# Patient Record
Sex: Female | Born: 1965 | Race: Black or African American | Hispanic: No | Marital: Married | Smoking: Never smoker
Health system: Southern US, Community
[De-identification: ages and names within clinical notes are randomized; demographics above are authoritative.]

## PROBLEM LIST (undated history)

## (undated) DIAGNOSIS — I10 Essential (primary) hypertension: Secondary | ICD-10-CM

## (undated) DIAGNOSIS — E559 Vitamin D deficiency, unspecified: Secondary | ICD-10-CM

## (undated) DIAGNOSIS — K5900 Constipation, unspecified: Secondary | ICD-10-CM

## (undated) DIAGNOSIS — Z9889 Other specified postprocedural states: Secondary | ICD-10-CM

## (undated) DIAGNOSIS — R112 Nausea with vomiting, unspecified: Secondary | ICD-10-CM

## (undated) DIAGNOSIS — E785 Hyperlipidemia, unspecified: Secondary | ICD-10-CM

## (undated) DIAGNOSIS — R7303 Prediabetes: Secondary | ICD-10-CM

## (undated) DIAGNOSIS — M549 Dorsalgia, unspecified: Secondary | ICD-10-CM

## (undated) DIAGNOSIS — M1712 Unilateral primary osteoarthritis, left knee: Secondary | ICD-10-CM

## (undated) DIAGNOSIS — K219 Gastro-esophageal reflux disease without esophagitis: Secondary | ICD-10-CM

## (undated) DIAGNOSIS — R5382 Chronic fatigue, unspecified: Secondary | ICD-10-CM

## (undated) DIAGNOSIS — M1990 Unspecified osteoarthritis, unspecified site: Secondary | ICD-10-CM

## (undated) DIAGNOSIS — E669 Obesity, unspecified: Secondary | ICD-10-CM

## (undated) DIAGNOSIS — K859 Acute pancreatitis without necrosis or infection, unspecified: Secondary | ICD-10-CM

## (undated) DIAGNOSIS — M2550 Pain in unspecified joint: Secondary | ICD-10-CM

## (undated) DIAGNOSIS — G5600 Carpal tunnel syndrome, unspecified upper limb: Secondary | ICD-10-CM

## (undated) HISTORY — PX: ABDOMINAL HYSTERECTOMY: SHX81

## (undated) HISTORY — PX: OTHER SURGICAL HISTORY: SHX169

## (undated) HISTORY — DX: Obesity, unspecified: E66.9

## (undated) HISTORY — DX: Unspecified osteoarthritis, unspecified site: M19.90

## (undated) HISTORY — PX: DILATION AND CURETTAGE OF UTERUS: SHX78

## (undated) HISTORY — DX: Chronic fatigue, unspecified: R53.82

## (undated) HISTORY — DX: Pain in unspecified joint: M25.50

## (undated) HISTORY — DX: Gastro-esophageal reflux disease without esophagitis: K21.9

## (undated) HISTORY — DX: Unilateral primary osteoarthritis, left knee: M17.12

## (undated) HISTORY — DX: Dorsalgia, unspecified: M54.9

## (undated) HISTORY — DX: Prediabetes: R73.03

## (undated) HISTORY — DX: Constipation, unspecified: K59.00

## (undated) HISTORY — DX: Essential (primary) hypertension: I10

## (undated) HISTORY — DX: Hyperlipidemia, unspecified: E78.5

## (undated) HISTORY — DX: Acute pancreatitis without necrosis or infection, unspecified: K85.90

## (undated) HISTORY — DX: Carpal tunnel syndrome, unspecified upper limb: G56.00

## (undated) HISTORY — DX: Vitamin D deficiency, unspecified: E55.9

---

## 2002-07-12 HISTORY — PX: OTHER SURGICAL HISTORY: SHX169

## 2003-11-06 ENCOUNTER — Ambulatory Visit (HOSPITAL_COMMUNITY): Admission: RE | Admit: 2003-11-06 | Discharge: 2003-11-06 | Payer: Self-pay | Admitting: Family Medicine

## 2004-09-16 ENCOUNTER — Ambulatory Visit: Payer: Self-pay | Admitting: Family Medicine

## 2005-06-08 ENCOUNTER — Ambulatory Visit: Payer: Self-pay | Admitting: Family Medicine

## 2005-08-13 ENCOUNTER — Emergency Department: Payer: Self-pay | Admitting: Emergency Medicine

## 2005-11-05 ENCOUNTER — Ambulatory Visit: Payer: Self-pay | Admitting: Family Medicine

## 2006-04-07 ENCOUNTER — Ambulatory Visit: Payer: Self-pay | Admitting: Family Medicine

## 2006-04-12 ENCOUNTER — Ambulatory Visit (HOSPITAL_COMMUNITY): Admission: RE | Admit: 2006-04-12 | Discharge: 2006-04-12 | Payer: Self-pay | Admitting: Family Medicine

## 2006-08-01 ENCOUNTER — Other Ambulatory Visit: Admission: RE | Admit: 2006-08-01 | Discharge: 2006-08-01 | Payer: Self-pay | Admitting: Family Medicine

## 2006-08-01 ENCOUNTER — Encounter (INDEPENDENT_AMBULATORY_CARE_PROVIDER_SITE_OTHER): Payer: Self-pay | Admitting: Specialist

## 2006-08-01 ENCOUNTER — Ambulatory Visit: Payer: Self-pay | Admitting: Family Medicine

## 2006-08-01 LAB — CONVERTED CEMR LAB
BUN: 17 mg/dL (ref 6–23)
Basophils Absolute: 0 10*3/uL (ref 0.0–0.1)
Basophils Relative: 0 % (ref 0–1)
CO2: 26 meq/L (ref 19–32)
Calcium: 9.5 mg/dL (ref 8.4–10.5)
Chloride: 104 meq/L (ref 96–112)
Cholesterol: 246 mg/dL — ABNORMAL HIGH (ref 0–200)
Creatinine, Ser: 0.65 mg/dL (ref 0.40–1.20)
Eosinophils Absolute: 0.1 10*3/uL (ref 0.0–0.7)
Eosinophils Relative: 1 % (ref 0–5)
Glucose, Bld: 89 mg/dL (ref 70–99)
HCT: 41.6 % (ref 36.0–46.0)
HDL: 70 mg/dL (ref >39)
Hemoglobin: 13 g/dL (ref 12.0–15.0)
LDL Cholesterol: 152 mg/dL — ABNORMAL HIGH (ref 0–99)
Lymphocytes Relative: 23 % (ref 12–46)
Lymphs Abs: 1.7 10*3/uL (ref 0.7–3.3)
MCHC: 31.3 g/dL (ref 30.0–36.0)
MCV: 86 fL (ref 78.0–100.0)
Monocytes Absolute: 0.4 10*3/uL (ref 0.2–0.7)
Monocytes Relative: 5 % (ref 3–11)
Neutro Abs: 5.4 10*3/uL (ref 1.7–7.7)
Neutrophils Relative %: 71 % (ref 43–77)
Pap Smear: NORMAL
Platelets: 339 10*3/uL (ref 150–400)
Potassium: 4.1 meq/L (ref 3.5–5.3)
RBC: 4.84 M/uL (ref 3.87–5.11)
RDW: 14.9 % — ABNORMAL HIGH (ref 11.5–14.0)
Sodium: 142 meq/L (ref 135–145)
TSH: 2.038 microintl units/mL (ref 0.350–5.50)
Total CHOL/HDL Ratio: 3.5
Triglycerides: 118 mg/dL (ref <150)
VLDL: 24 mg/dL (ref 0–40)
WBC: 7.6 10*3/uL (ref 4.0–10.5)

## 2006-08-02 ENCOUNTER — Encounter: Payer: Self-pay | Admitting: Family Medicine

## 2006-08-02 LAB — CONVERTED CEMR LAB
Candida species: NEGATIVE
Chlamydia, DNA Probe: NEGATIVE
GC Probe Amp, Genital: NEGATIVE
Gardnerella vaginalis: POSITIVE — AB
Trichomonal Vaginitis: NEGATIVE

## 2007-01-04 ENCOUNTER — Ambulatory Visit: Payer: Self-pay | Admitting: Family Medicine

## 2007-01-09 ENCOUNTER — Ambulatory Visit (HOSPITAL_COMMUNITY): Admission: RE | Admit: 2007-01-09 | Discharge: 2007-01-09 | Payer: Self-pay | Admitting: Family Medicine

## 2007-07-13 ENCOUNTER — Encounter: Payer: Self-pay | Admitting: Family Medicine

## 2007-07-18 ENCOUNTER — Ambulatory Visit: Payer: Self-pay | Admitting: Family Medicine

## 2007-07-18 LAB — CONVERTED CEMR LAB
Basophils Absolute: 0 10*3/uL (ref 0.0–0.1)
Basophils Relative: 0 % (ref 0–1)
Eosinophils Absolute: 0.1 10*3/uL (ref 0.0–0.7)
Eosinophils Relative: 1 % (ref 0–5)
HCT: 40.2 % (ref 36.0–46.0)
Hemoglobin: 12.5 g/dL (ref 12.0–15.0)
Lymphocytes Relative: 22 % (ref 12–46)
Lymphs Abs: 1.7 10*3/uL (ref 0.7–4.0)
MCHC: 31.1 g/dL (ref 30.0–36.0)
MCV: 84.1 fL (ref 78.0–100.0)
Monocytes Absolute: 0.4 10*3/uL (ref 0.1–1.0)
Monocytes Relative: 6 % (ref 3–12)
Neutro Abs: 5.4 10*3/uL (ref 1.7–7.7)
Neutrophils Relative %: 71 % (ref 43–77)
Platelets: 314 10*3/uL (ref 150–400)
RBC: 4.78 M/uL (ref 3.87–5.11)
RDW: 14.1 % (ref 11.5–15.5)
Rhuematoid fact SerPl-aCnc: <20 intl units/mL (ref 0–20)
Sed Rate: 20 mm/hr (ref 0–22)
Total CK: 62 units/L (ref 7–177)
WBC: 7.6 10*3/uL (ref 4.0–10.5)

## 2007-12-21 ENCOUNTER — Encounter: Payer: Self-pay | Admitting: Family Medicine

## 2007-12-21 DIAGNOSIS — E669 Obesity, unspecified: Secondary | ICD-10-CM | POA: Insufficient documentation

## 2007-12-21 DIAGNOSIS — M129 Arthropathy, unspecified: Secondary | ICD-10-CM | POA: Insufficient documentation

## 2007-12-21 DIAGNOSIS — R5383 Other fatigue: Secondary | ICD-10-CM

## 2007-12-21 DIAGNOSIS — I10 Essential (primary) hypertension: Secondary | ICD-10-CM | POA: Insufficient documentation

## 2007-12-21 DIAGNOSIS — R5381 Other malaise: Secondary | ICD-10-CM | POA: Insufficient documentation

## 2008-01-01 ENCOUNTER — Ambulatory Visit: Payer: Self-pay | Admitting: Family Medicine

## 2008-01-01 ENCOUNTER — Encounter: Payer: Self-pay | Admitting: Family Medicine

## 2008-01-02 ENCOUNTER — Encounter: Payer: Self-pay | Admitting: Family Medicine

## 2008-01-02 LAB — CONVERTED CEMR LAB
Chlamydia, DNA Probe: NEGATIVE
GC Probe Amp, Genital: NEGATIVE

## 2008-01-03 ENCOUNTER — Encounter: Payer: Self-pay | Admitting: Family Medicine

## 2008-01-03 LAB — CONVERTED CEMR LAB
Candida species: NEGATIVE
Gardnerella vaginalis: POSITIVE — AB
Trichomonal Vaginitis: NEGATIVE

## 2008-01-06 ENCOUNTER — Encounter: Payer: Self-pay | Admitting: Family Medicine

## 2008-01-06 LAB — CONVERTED CEMR LAB
BUN: 13 mg/dL (ref 6–23)
CO2: 24 meq/L (ref 19–32)
Calcium: 8.6 mg/dL (ref 8.4–10.5)
Chloride: 107 meq/L (ref 96–112)
Cholesterol: 198 mg/dL (ref 0–200)
Creatinine, Ser: 0.62 mg/dL (ref 0.40–1.20)
Glucose, Bld: 90 mg/dL (ref 70–99)
HDL: 60 mg/dL (ref >39)
LDL Cholesterol: 124 mg/dL — ABNORMAL HIGH (ref 0–99)
Potassium: 4.1 meq/L (ref 3.5–5.3)
Sodium: 141 meq/L (ref 135–145)
Total CHOL/HDL Ratio: 3.3
Triglycerides: 68 mg/dL (ref <150)
VLDL: 14 mg/dL (ref 0–40)

## 2008-01-15 ENCOUNTER — Encounter: Payer: Self-pay | Admitting: Family Medicine

## 2008-01-15 ENCOUNTER — Ambulatory Visit: Payer: Self-pay | Admitting: Family Medicine

## 2008-01-15 DIAGNOSIS — N3000 Acute cystitis without hematuria: Secondary | ICD-10-CM | POA: Insufficient documentation

## 2008-01-15 DIAGNOSIS — M545 Low back pain, unspecified: Secondary | ICD-10-CM

## 2008-01-15 DIAGNOSIS — M549 Dorsalgia, unspecified: Secondary | ICD-10-CM | POA: Insufficient documentation

## 2008-01-15 HISTORY — DX: Low back pain, unspecified: M54.50

## 2008-01-15 LAB — CONVERTED CEMR LAB
Bilirubin Urine: NEGATIVE
Blood in Urine, dipstick: NEGATIVE
Glucose, Urine, Semiquant: NEGATIVE
Ketones, urine, test strip: NEGATIVE
Nitrite: NEGATIVE
Protein, U semiquant: NEGATIVE
Specific Gravity, Urine: 1.025
Urobilinogen, UA: NEGATIVE
WBC Urine, dipstick: NEGATIVE
pH: 7

## 2008-09-09 ENCOUNTER — Ambulatory Visit: Payer: Self-pay | Admitting: Family Medicine

## 2008-09-09 DIAGNOSIS — N760 Acute vaginitis: Secondary | ICD-10-CM | POA: Insufficient documentation

## 2008-09-18 ENCOUNTER — Ambulatory Visit (HOSPITAL_COMMUNITY): Admission: RE | Admit: 2008-09-18 | Discharge: 2008-09-18 | Payer: Self-pay | Admitting: Family Medicine

## 2008-09-18 ENCOUNTER — Encounter: Payer: Self-pay | Admitting: Family Medicine

## 2008-09-18 LAB — CONVERTED CEMR LAB
Chlamydia, DNA Probe: NEGATIVE
GC Probe Amp, Genital: NEGATIVE

## 2008-09-20 LAB — CONVERTED CEMR LAB
Candida species: NEGATIVE
Gardnerella vaginalis: POSITIVE — AB
Trichomonal Vaginitis: NEGATIVE

## 2008-09-26 ENCOUNTER — Ambulatory Visit: Payer: Self-pay | Admitting: Family Medicine

## 2008-09-26 DIAGNOSIS — M79609 Pain in unspecified limb: Secondary | ICD-10-CM | POA: Insufficient documentation

## 2008-10-04 ENCOUNTER — Ambulatory Visit (HOSPITAL_COMMUNITY): Admission: RE | Admit: 2008-10-04 | Discharge: 2008-10-04 | Payer: Self-pay | Admitting: Surgery

## 2009-03-12 ENCOUNTER — Ambulatory Visit: Payer: Self-pay | Admitting: Family Medicine

## 2009-05-14 ENCOUNTER — Encounter: Payer: Self-pay | Admitting: Family Medicine

## 2009-05-14 ENCOUNTER — Other Ambulatory Visit: Admission: RE | Admit: 2009-05-14 | Discharge: 2009-05-14 | Payer: Self-pay | Admitting: Family Medicine

## 2009-05-14 ENCOUNTER — Ambulatory Visit: Payer: Self-pay | Admitting: Family Medicine

## 2009-05-14 LAB — CONVERTED CEMR LAB
Bilirubin Urine: NEGATIVE
Blood in Urine, dipstick: NEGATIVE
Glucose, Urine, Semiquant: NEGATIVE
Ketones, urine, test strip: NEGATIVE
Nitrite: NEGATIVE
OCCULT 1: NEGATIVE
Protein, U semiquant: NEGATIVE
Specific Gravity, Urine: 1.02
Urobilinogen, UA: 1
pH: 6.5

## 2009-05-15 ENCOUNTER — Encounter: Payer: Self-pay | Admitting: Family Medicine

## 2009-05-15 LAB — CONVERTED CEMR LAB
BUN: 9 mg/dL (ref 6–23)
Basophils Absolute: 0 10*3/uL (ref 0.0–0.1)
Basophils Relative: 0 % (ref 0–1)
CO2: 24 meq/L (ref 19–32)
Calcium: 9.2 mg/dL (ref 8.4–10.5)
Candida species: NEGATIVE
Chlamydia, DNA Probe: NEGATIVE
Chloride: 105 meq/L (ref 96–112)
Cholesterol: 212 mg/dL — ABNORMAL HIGH (ref 0–200)
Creatinine, Ser: 0.62 mg/dL (ref 0.40–1.20)
Eosinophils Absolute: 0.1 10*3/uL (ref 0.0–0.7)
Eosinophils Relative: 1 % (ref 0–5)
GC Probe Amp, Genital: NEGATIVE
Gardnerella vaginalis: POSITIVE — AB
Glucose, Bld: 91 mg/dL (ref 70–99)
HCT: 39.8 % (ref 36.0–46.0)
HDL: 56 mg/dL (ref >39)
Hemoglobin: 12.4 g/dL (ref 12.0–15.0)
LDL Cholesterol: 135 mg/dL — ABNORMAL HIGH (ref 0–99)
Lymphocytes Relative: 23 % (ref 12–46)
Lymphs Abs: 1.5 10*3/uL (ref 0.7–4.0)
MCHC: 31.2 g/dL (ref 30.0–36.0)
MCV: 83.1 fL (ref 78.0–100.0)
Monocytes Absolute: 0.4 10*3/uL (ref 0.1–1.0)
Monocytes Relative: 6 % (ref 3–12)
Neutro Abs: 4.7 10*3/uL (ref 1.7–7.7)
Neutrophils Relative %: 70 % (ref 43–77)
Platelets: 287 10*3/uL (ref 150–400)
Potassium: 4.1 meq/L (ref 3.5–5.3)
RBC: 4.79 M/uL (ref 3.87–5.11)
RDW: 14.4 % (ref 11.5–15.5)
Sodium: 141 meq/L (ref 135–145)
TSH: 1.476 microintl units/mL (ref 0.350–4.500)
Total CHOL/HDL Ratio: 3.8
Triglycerides: 105 mg/dL (ref <150)
VLDL: 21 mg/dL (ref 0–40)
WBC: 6.7 10*3/uL (ref 4.0–10.5)

## 2009-07-24 ENCOUNTER — Ambulatory Visit: Payer: Self-pay | Admitting: Family Medicine

## 2009-10-06 ENCOUNTER — Ambulatory Visit: Payer: Self-pay | Admitting: Family Medicine

## 2009-10-06 DIAGNOSIS — R110 Nausea: Secondary | ICD-10-CM | POA: Insufficient documentation

## 2009-10-06 DIAGNOSIS — R109 Unspecified abdominal pain: Secondary | ICD-10-CM | POA: Insufficient documentation

## 2009-10-06 LAB — CONVERTED CEMR LAB
Bilirubin Urine: NEGATIVE
Blood in Urine, dipstick: NEGATIVE
Glucose, Urine, Semiquant: NEGATIVE
Ketones, urine, test strip: NEGATIVE
Nitrite: NEGATIVE
Specific Gravity, Urine: >=1.03
Urobilinogen, UA: 0.2
pH: 6

## 2009-10-07 ENCOUNTER — Encounter: Payer: Self-pay | Admitting: Physician Assistant

## 2009-10-07 LAB — CONVERTED CEMR LAB
Candida species: NEGATIVE
Chlamydia, DNA Probe: NEGATIVE
GC Probe Amp, Genital: NEGATIVE
Gardnerella vaginalis: POSITIVE — AB

## 2009-12-11 ENCOUNTER — Encounter: Payer: Self-pay | Admitting: Family Medicine

## 2010-02-06 ENCOUNTER — Ambulatory Visit: Payer: Self-pay | Admitting: Family Medicine

## 2010-02-06 DIAGNOSIS — E785 Hyperlipidemia, unspecified: Secondary | ICD-10-CM | POA: Insufficient documentation

## 2010-02-07 ENCOUNTER — Encounter: Payer: Self-pay | Admitting: Physician Assistant

## 2010-02-07 LAB — CONVERTED CEMR LAB
Chlamydia, DNA Probe: NEGATIVE
GC Probe Amp, Genital: NEGATIVE

## 2010-02-10 LAB — CONVERTED CEMR LAB
Candida species: NEGATIVE
Gardnerella vaginalis: POSITIVE — AB

## 2010-08-01 ENCOUNTER — Encounter: Payer: Self-pay | Admitting: Family Medicine

## 2010-08-02 ENCOUNTER — Encounter: Payer: Self-pay | Admitting: Family Medicine

## 2010-08-03 ENCOUNTER — Encounter: Payer: Self-pay | Admitting: Family Medicine

## 2010-08-11 NOTE — Letter (Signed)
Summary: history and physical  history and physical   Imported By: Curtis Sites 12/11/2009 13:49:18  _____________________________________________________________________  External Attachment:    Type:   Image     Comment:   External Document

## 2010-08-11 NOTE — Letter (Signed)
Summary: demographic  demographic   Imported By: Curtis Sites 12/11/2009 13:52:49  _____________________________________________________________________  External Attachment:    Type:   Image     Comment:   External Document

## 2010-08-11 NOTE — Letter (Signed)
Summary: progress notes  progress notes   Imported By: Curtis Sites 12/11/2009 13:47:55  _____________________________________________________________________  External Attachment:    Type:   Image     Comment:   External Document

## 2010-08-11 NOTE — Assessment & Plan Note (Signed)
Summary: office visit   Vital Signs:  Patient profile:   45 year old female Menstrual status:  irregular Height:      66 inches Weight:      216.75 pounds BMI:     35.11 O2 Sat:      99 % Pulse rate:   103 / minute Resp:     16 per minute BP sitting:   140 / 80  (left arm)  Vitals Entered By: Worthy Keeler LPN (July 24, 2009 9:26 AM)  Nutrition Counseling: Patient's BMI is greater than 25 and therefore counseled on weight management options. CC: follow-up visit Is Patient Diabetic? No Pain Assessment Patient in pain? no        Primary Care Provider:  Syliva Overman MD  CC:  follow-up visit.  History of Present Illness: Reports  that tshe has  doing well. she continues to experience excellent apetite suppression, unfortunately , she is still unable to exercise as she is till recuperating from foot surgery. Denies recent fever or chills. Denies sinus pressure, nasal congestion , ear pain or sore throat. Denies chest congestion, or cough productive of sputum. Denies chest pain, palpitations, PND, orthopnea or leg swelling. Denies abdominal pain, nausea, vomitting, diarrhea or constipation. Denies change in bowel movements or bloody stool. Denies dysuria , frequency, incontinence or hesitancy. Denies  joint pain, swelling, or reduced mobility. Denies headaches, vertigo, seizures. Denies depression, anxiety or insomnia. Denies  rash, lesions, or itch.     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 MS:  Complains of joint pain and stiffness; recovering from foot surgery still, unble to exercise.  Physical Exam  General:  Well-developed,well-nourished,in no acute distress; alert,appropriate and cooperative throughout examination HEENT: No facial asymmetry,  EOMI,  No sinus tenderness, TM's Clear, oropharynx  pink and moist.   Chest: Clear to auscultation bilaterally.  CVS: S1, S2, No murmurs, No S3.   Abd: Soft, Nontender.  MS: Adequate ROM spine, hips, shoulders and knees.  Ext: No edema.   CNS: CN 2-12 intact, power tone and sensation normal throughout.   Skin: Intact, no visible lesions or rashes.  Psych: Good eye contact, normal affect.  Memory intact, not anxious or depressed appearing. Genital exam not reptd   Impression & Recommendations:  Problem # 1:  UNSPECIFIED VAGINITIS AND VULVOVAGINITIS (ICD-616.10) Assessment Unchanged  Her updated medication list for this problem includes:    Flagyl 500 Mg Tabs (Metronidazole) ..... One tab by mouth bid    Ciprofloxacin Hcl 500 Mg Tabs (Ciprofloxacin hcl) .Marland Kitchen... Take 1 tablet by mouth two times a day    Metronidazole 500 Mg Tabs (Metronidazole) .Marland Kitchen... Take 1 tablet by mouth two times a day prescription written for pt and her spouse, impt of total treamenmt by both before intercourse was stressed.  Problem # 2:  HYPERTENSION (ICD-401.9) Assessment: Deteriorated  Her updated medication list for this problem includes:    Diovan Hct 320-25 Mg Tabs (Valsartan-hydrochlorothiazide) .Marland Kitchen... Take 1 tablet by mouth once a day    Altace 5 Mg Caps (Ramipril) .Marland Kitchen... Take 1 tablet by mouth once a day  BP today: 140/80 Prior BP: 130/82 (05/14/2009)  Labs Reviewed: K+: 4.1 (05/14/2009) Creat: : 0.62 (05/14/2009)   Chol: 212 (05/14/2009)   HDL: 56 (05/14/2009)   LDL: 135 (  05/14/2009)   TG: 105 (05/14/2009)  Problem # 3:  OBESITY (ICD-278.00) Assessment: Improved  Ht: 66 (07/24/2009)   Wt: 216.75 (07/24/2009)   BMI: 35.11 (07/24/2009)  Complete Medication List: 1)  Diovan Hct 320-25 Mg Tabs (Valsartan-hydrochlorothiazide) .... Take 1 tablet by mouth once a day 2)  Loratadine 10 Mg Tabs (Loratadine) .... Take 1 tablet by mouth once a day 3)  Altace 5 Mg Caps (Ramipril) .... Take 1 tablet by mouth once a  day 4)  Ibuprofen 800 Mg Tabs (Ibuprofen) .... Take 1 tablet by mouth three times a day 5)  Phentermine Hcl 37.5 Mg Tabs (Phentermine hcl) .... Take 1 tablet by mouth once a day 6)  Flagyl 500 Mg Tabs (Metronidazole) .... One tab by mouth bid 7)  Ciprofloxacin Hcl 500 Mg Tabs (Ciprofloxacin hcl) .... Take 1 tablet by mouth two times a day 8)  Fluconazole 150 Mg Tabs (Fluconazole) .... Take 1 tablet by mouth once a day as needed 9)  Metronidazole 500 Mg Tabs (Metronidazole) .... Take 1 tablet by mouth two times a day 10)  Fluconazole 150 Mg Tabs (Fluconazole) .... Take 1 tablet by mouth once a day as needed  Other Orders: Radiology Referral (Radiology)  Patient Instructions: 1)  Please schedule a follow-up appointment in 2 months. 2)  It is important that you exercise regularly at least 20 minutes 5 times a week. If you develop chest pain, have severe difficulty breathing, or feel very tired , stop exercising immediately and seek medical attention. 3)  You need to lose weight. Consider a lower calorie diet and regular exercise.  4)  Schedule your mammogram.  Due March 10 or after. 5)  Congrats on weight loss, keep it up. 6)  you will receive 2 separate scripts for infection, one for your partener and the other for you. Prescriptions: PHENTERMINE HCL 37.5 MG TABS (PHENTERMINE HCL) Take 1 tablet by mouth once a day  #30 x 1   Entered by:   Worthy Keeler LPN   Authorized by:   Syliva Overman MD   Signed by:   Worthy Keeler LPN on 80/99/8338   Method used:   Print then Give to Patient   RxID:   [id number] FLUCONAZOLE 150 MG TABS (FLUCONAZOLE) Take 1 tablet by mouth once a day as needed  #3 x 0   Entered and Authorized by:   Syliva Overman MD   Signed by:   Syliva Overman MD on 07/24/2009   Method used:   Print then Give to Patient   RxID:   [id number] METRONIDAZOLE 500 MG TABS (METRONIDAZOLE) Take 1 tablet by mouth two times a day  #14 x 0   Entered and Authorized by:    Syliva Overman MD   Signed by:   Syliva Overman MD on 07/24/2009   Method used:   Print then Give to Patient   RxID:   [id number]

## 2010-08-11 NOTE — Letter (Signed)
Summary: history and phyiscal  history and phyiscal   Imported By: Curtis Sites 12/11/2009 13:39:08  _____________________________________________________________________  External Attachment:    Type:   Image     Comment:   External Document

## 2010-08-11 NOTE — Letter (Signed)
Summary: misc  misc   Imported By: Curtis Sites 12/11/2009 13:43:39  _____________________________________________________________________  External Attachment:    Type:   Image     Comment:   External Document

## 2010-08-11 NOTE — Assessment & Plan Note (Signed)
Summary: vaginal irritation- room 1   Vital Signs:  Patient profile:   45 year old female Menstrual status:  irregular LMP:     01/24/2010 Height:      65 inches Weight:      215.25 pounds BMI:     35.95 O2 Sat:      100 % on Room air Pulse rate:   99 / minute Resp:     16 per minute BP sitting:   126 / 80  (left arm)  Vitals Entered By: Adella Hare LPN (February 06, 2010 10:15 AM)  Nutrition Counseling: Patient's BMI is greater than 25 and therefore counseled on weight management options. CC: vaginal irritation Is Patient Diabetic? No Pain Assessment Patient in pain? no      LMP (date): 01/24/2010     Enter LMP: 01/24/2010 Last PAP Result NEGATIVE FOR INTRAEPITHELIAL LESIONS OR MALIGNANCY.   Primary Provider:  Syliva Overman MD  CC:  vaginal irritation.  History of Present Illness: Pt presents today with c/o genital itching x 4 days.  No vag dischg.  No new partners.  Is requesting STI testing. Declines HIV test though, stating she has had one in the previously. No change of bath soaps, but did change laundry detergent recently to something milder. No dysuria or frequency.  Pt is also concerned about her wt.  She has used Phentermine in the past and would like to try this again.  She admits that she is not watching her diet or exercising. She states that it is hard for her to exercise due to her hx of foot surgery last yr.  Hx of htn. Taking medications as prescribed. No headache, chest pain or palpitations.    Allergies (verified): No Known Drug Allergies  Past History:  Past medical history reviewed for relevance to current acute and chronic problems.  Past Medical History: Reviewed history from 12/21/2007 and no changes required. * ? of CARPAL TUNNEL SYNDROME ARTHRITIS (ICD-716.90) FATIGUE, CHRONIC (ICD-780.79) HYPERTENSION (ICD-401.9) OBESITY (ICD-278.00)  Review of Systems General:  Denies chills and fever. CV:  Denies chest pain or discomfort,  lightheadness, and palpitations. Resp:  Denies shortness of breath. GI:  Denies abdominal pain, change in bowel habits, nausea, and vomiting. GU:  Denies abnormal vaginal bleeding, discharge, dysuria, nocturia, and urinary frequency. Neuro:  Complains of headaches; INTERMITTENT "SINUS" HEADACHES.  Physical Exam  General:  Well-developed,well-nourished,in no acute distress; alert,appropriate and cooperative throughout examination Head:  Normocephalic and atraumatic without obvious abnormalities. No apparent alopecia or balding. Ears:  External ear exam shows no significant lesions or deformities.  Otoscopic examination reveals clear canals, tympanic membranes are intact bilaterally without bulging, retraction, inflammation or discharge. Hearing is grossly normal bilaterally. Nose:  External nasal examination shows no deformity or inflammation. Nasal mucosa are pink and moist without lesions or exudates. Mouth:  Oral mucosa and oropharynx without lesions or exudates.  Teeth in good repair. Neck:  No deformities, masses, or tenderness noted. Lungs:  Normal respiratory effort, chest expands symmetrically. Lungs are clear to auscultation, no crackles or wheezes. Heart:  Normal rate and regular rhythm. S1 and S2 normal without gallop, murmur, click, rub or other extra sounds. Abdomen:  soft, non-tender, and no masses.   Genitalia:  Normal introitus for age, no external lesions, no vaginal discharge, mucosa pink and moist, no vaginal or cervical lesions, no vaginal atrophy, no friaility or hemorrhage, normal uterus size and position, no adnexal masses or tenderness Cervical Nodes:  No lymphadenopathy noted Psych:  Cognition and  judgment appear intact. Alert and cooperative with normal attention span and concentration. No apparent delusions, illusions, hallucinations   Impression & Recommendations:  Problem # 1:  HYPERTENSION (ICD-401.9) Assessment Comment Only  Her updated medication list for  this problem includes:    Diovan Hct 320-25 Mg Tabs (Valsartan-hydrochlorothiazide) .Marland Kitchen... Take 1 tablet by mouth once a day    Altace 5 Mg Caps (Ramipril) .Marland Kitchen... Take 1 tablet by mouth once a day  Orders: T-Comprehensive Metabolic Panel ([id number])  BP today: 126/80 Prior BP: 132/72 (10/06/2009)  Labs Reviewed: K+: 4.1 (05/14/2009) Creat: : 0.62 (05/14/2009)   Chol: 212 (05/14/2009)   HDL: 56 (05/14/2009)   LDL: 135 (05/14/2009)   TG: 105 (05/14/2009)  Problem # 2:  UNSPECIFIED VAGINITIS AND VULVOVAGINITIS (ICD-616.10) Assessment: New Triamcinolone oint prescribed for external itching.  Await culture results, exam was negative.  Discussed avoid overwashing area, rinse well when bathing, etc.  The following medications were removed from the medication list:    Flagyl 500 Mg Tabs (Metronidazole) ..... One tab by mouth bid    Ciprofloxacin Hcl 500 Mg Tabs (Ciprofloxacin hcl) .Marland Kitchen... Take 1 tablet by mouth two times a day    Metronidazole 500 Mg Tabs (Metronidazole) .Marland Kitchen... Take 1 tablet by mouth two times a day  Orders: T-Wet Prep by Molecular Probe [phone]) T-Chlamydia & GC Probe, Genital ([id number])  Problem # 3:  OBESITY (ICD-278.00) Assessment: Deteriorated Discussed with pt that Phentermine is a tool to help her with wt loss but that she must change her eating habits and physical activity to help with wt loss and to maintain any wt loss.  Orders: T- Hemoglobin A1C ([id number])  Ht: 65 (02/06/2010)   Wt: 215.25 (02/06/2010)   BMI: 35.95 (02/06/2010)  Complete Medication List: 1)  Diovan Hct 320-25 Mg Tabs (Valsartan-hydrochlorothiazide) .... Take 1 tablet by mouth once a day 2)  Altace 5 Mg Caps (Ramipril) .... Take 1 tablet by mouth once a day 3)  Ibuprofen 800 Mg Tabs (Ibuprofen) .... Take 1 tablet by mouth three times a day 4)  Phentermine Hcl 37.5 Mg Tabs (Phentermine hcl) .... Take 1 tablet by mouth once a day 5)  Triamcinolone Acetonide 0.1 % Oint  (Triamcinolone acetonide) .... 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 2)  It is important that you exercise regularly at least 20 minutes 5 times a week. If you develop chest pain, have severe difficulty breathing, or feel very tired , stop exercising immediately and seek medical attention. 3)  You need to lose weight. Consider a lower calorie diet and regular exercise.  4)  Await culture results. 5)  I have prescribed a cream for you to use for your itching. 6)  I have ordered blood work.  Please have this drawn fasting. Prescriptions: TRIAMCINOLONE ACETONIDE 0.1 % OINT (TRIAMCINOLONE ACETONIDE) apply externally two times a day as needed for itching  #15 grams x 0   Entered and Authorized by:   Esperanza Sheets PA   Signed by:   Esperanza Sheets PA on 02/06/2010   Method used:   Print then Give to Patient   RxID:   [id number] PHENTERMINE HCL 37.5 MG TABS (PHENTERMINE HCL) Take 1 tablet by mouth once a day  #30 x 0   Entered and Authorized by:   Esperanza Sheets  PA   Signed by:   Esperanza Sheets PA on 02/06/2010   Method used:   Print then Give to Patient   RxID:   [id number]

## 2010-08-11 NOTE — Medication Information (Signed)
Summary: Tax adviser   Imported By: Lind Guest 07/24/2009 10:34:49  _____________________________________________________________________  External Attachment:    Type:   Image     Comment:   External Document

## 2010-08-11 NOTE — Assessment & Plan Note (Signed)
Summary: ov   Vital Signs:  Patient profile:   45 year old female Menstrual status:  irregular Height:      66 inches (167.64 cm) Weight:      234 pounds (106.36 kg) BMI:     37.91 BSA:     2.14 O2 Sat:      98 % Pulse rate:   88 / minute Resp:     16 per minute BP sitting:   124 / 80  (left arm)  Vitals Entered By: Everitt Amber (September 26, 2008 8:44 AM)  Nutrition Counseling: Patient's BMI is greater than 25 and therefore counseled on weight management options. CC: knot on bottom of left foot, very tender  years   days  Menstrual Status irregular Last PAP Result normal   CC:  knot on bottom of left foot and very tender.  History of Present Illness: 2 wek h/o painful left foot, swelling worsened on the ball of the grt toe over this time, now tender. No fever, chills or drainage so far.she has no h/o trauma to the foot. She has been working on weight loss through lifestyle modification.  Preventive Screening-Counseling & Management     Smoking Status: never  Allergies (verified): No Known Drug Allergies  Review of Systems General:  Denies chills and fever. ENT:  Denies hoarseness, postnasal drainage, and sinus pressure. CV:  Denies chest pain or discomfort, palpitations, and swelling of feet. Resp:  Denies cough, sputum productive, and wheezing. GI:  Denies abdominal pain, diarrhea, nausea, and vomiting. GU:  Denies dysuria and urinary frequency. MS:  See HPI. Psych:  Denies anxiety and depression.  Physical Exam  General:  alert, well-hydrated, and overweight-appearing.  HEENT: No facial asymmetry,  EOMI, No sinus tenderness, TM's Clear, oropharynx  pink and moist.   Chest: Clear to auscultation bilaterally.  CVS: S1, S2, No murmurs, No S3.   Abd: Soft, Nontender.  MS: Adequate ROM spine, hips, shoulders and knees.  Ext: No edema.   CNS: CN 2-12 intact, power tone and sensation normal throughout.   Skin: Intact,erythematous tender swelling on ball of grt toe,  with purulent appearing skin on the top, no actual drainage noted  Psych: Good eye contact, normal affect.  Memory intact, not anxious or depressed appearing.    Impression & Recommendations:  Problem # 1:  FOOT PAIN, LEFT (ICD-729.5) Assessment Comment Only  Orders: Podiatry Referral (Podiatry), pt prescribedmeds for abcess on foot also, however i think that it needs I/D  Problem # 2:  HYPERTENSION (ICD-401.9) Assessment: Unchanged  Her updated medication list for this problem includes:    Diovan Hct 320-25 Mg Tabs (Valsartan-hydrochlorothiazide) .Marland Kitchen... Take 1 tablet by mouth once a day    Altace 5 Mg Caps (Ramipril) .Marland Kitchen... Take 1 tablet by mouth once a day  BP today: 124/80 Prior BP: 128/84 (09/09/2008)  Labs Reviewed: K+: 4.1 (01/06/2008) Creat: : 0.62 (01/06/2008)   Chol: 198 (01/06/2008)   HDL: 60 (01/06/2008)   LDL: 124 (01/06/2008)   TG: 68 (01/06/2008)  Problem # 3:  OBESITY (ICD-278.00) Assessment: Improved  Complete Medication List: 1)  Diovan Hct 320-25 Mg Tabs (Valsartan-hydrochlorothiazide) .... Take 1 tablet by mouth once a day 2)  Loratadine 10 Mg Tabs (Loratadine) .... Take 1 tablet by mouth once a day 3)  Altace 5 Mg Caps (Ramipril) .... Take 1 tablet by mouth once a day 4)  Ibuprofen 800 Mg Tabs (Ibuprofen) .... Take 1 tablet by mouth three times a day 5)  Phentermine  Hcl 37.5 Mg Tabs (Phentermine hcl) .... Take 1 tablet by mouth once a day 6)  Metronidazole 500 Mg Tabs (Metronidazole) .... One tab by mouth bid 7)  Doxycycline Hyclate 100 Mg Tabs (Doxycycline hyclate) .... Take 1 tablet by mouth two times a day 8)  Vicodin 5-500 Mg Tabs (Hydrocodone-acetaminophen) .... Take 1 tablet by mouth three times a day as needed  Patient Instructions: 1)  F/U as before. 2)  you have been given prescriptions for infection and pain, please do not fill them until you see the podiatrist and they asses your foot. 3)  no med changes, congrats on weight loss.   Prescriptions: VICODIN 5-500 MG TABS (HYDROCODONE-ACETAMINOPHEN) Take 1 tablet by mouth three times a day as needed  #30 x 0   Entered and Authorized by:   Syliva Overman MD   Signed by:   Syliva Overman MD on 09/26/2008   Method used:   Printed then faxed to ...       [facility] [phone]* (retail)       [address] [place], [place]  [address]       Ph: [phone]       Fax: [phone]   RxID:   [id number] FLUCONAZOLE 100 MG TABS (FLUCONAZOLE) Take 1 tablet by mouth once a day as needed  #3 x 0   Entered and Authorized by:   Syliva Overman MD   Signed by:   Syliva Overman MD on 09/26/2008   Method used:   Electronically to        [facility] [phone]* (retail)       [address] [place], [place]  [address]       Ph: [phone]       Fax: [phone]   RxID:   [id number] DOXYCYCLINE HYCLATE 100 MG TABS (DOXYCYCLINE HYCLATE) Take 1 tablet by mouth two times a day  #20 x 0   Entered and Authorized by:   Syliva Overman MD   Signed by:   Syliva Overman MD on 09/26/2008   Method used:   Electronically to        [facility] [phone]* (retail)       [address] [place], [place]  [address]       Ph: [phone]       Fax: [phone]   RxID:   [id number]

## 2010-08-11 NOTE — Letter (Signed)
Summary: progress notes  progress notes   Imported By: Curtis Sites 12/11/2009 13:45:19  _____________________________________________________________________  External Attachment:    Type:   Image     Comment:   External Document

## 2010-08-11 NOTE — Letter (Signed)
Summary: labs  labs   Imported By: Curtis Sites 12/11/2009 13:36:57  _____________________________________________________________________  External Attachment:    Type:   Image     Comment:   External Document

## 2010-08-11 NOTE — Letter (Signed)
Summary: phone note  phone note   Imported By: Curtis Sites 12/11/2009 13:37:35  _____________________________________________________________________  External Attachment:    Type:   Image     Comment:   External Document

## 2010-08-11 NOTE — Assessment & Plan Note (Signed)
Summary: OV   Vital Signs:  Patient profile:   45 year old female Menstrual status:  irregular LMP:     09/15/2009 Height:      65 inches Weight:      210.08 pounds Pulse rate:   99 / minute BP sitting:   132 / 72  (left arm) CC: patient thinks she has a kidney/bladder infection she states she started hurting in her back on Thursday, and also feels sick to stomach.  Is Patient Diabetic? No LMP (date): 09/15/2009     Enter LMP: 09/15/2009 Last PAP Result NEGATIVE FOR INTRAEPITHELIAL LESIONS OR MALIGNANCY.   Primary Provider:  Syliva Overman MD  CC:  patient thinks she has a kidney/bladder infection she states she started hurting in her back on Thursday and and also feels sick to stomach. .  History of Present Illness: Pt presents with c/o nausea.  Onset last Thurs. Low back pain started Fri. Also has some lower abd pain. No vomiting or diarrhea. No dysuria.  No vag discharg. +increase freq, nocturia x 3 last night.  Allergies: No Known Drug Allergies  Past History:  Past medical history reviewed for relevance to current acute and chronic problems.  Past Medical History: Reviewed history from 12/21/2007 and no changes required. * ? of CARPAL TUNNEL SYNDROME ARTHRITIS (ICD-716.90) FATIGUE, CHRONIC (ICD-780.79) HYPERTENSION (ICD-401.9) OBESITY (ICD-278.00)  Review of Systems General:  Denies chills and fever. Resp:  Denies shortness of breath. GI:  Complains of abdominal pain and nausea; denies change in bowel habits, constipation, and diarrhea. GU:  Complains of urinary frequency; denies discharge, dysuria, and hematuria.  Physical Exam  General:  Well-developed,well-nourished,in no acute distress; alert,appropriate and cooperative throughout examination Head:  Normocephalic and atraumatic without obvious abnormalities. No apparent alopecia or balding. Lungs:  Normal respiratory effort, chest expands symmetrically. Lungs are clear to auscultation, no crackles  or wheezes. Heart:  Normal rate and regular rhythm. S1 and S2 normal without gallop, murmur, click, rub or other extra sounds. Abdomen:  Nl BS x 4.  Soft.  Suprapubic TTP. Remainder of abd nontender. No guarding, rebound or rigidity. Genitalia:  normal introitus and no external lesions.  Vag sm amt of white mucus noted.  Cervix neg.  Bimanual no CMT, but is TTP uterus & Bilat adnexa.  Neg chandelier sign. Msk:  Mild TTP L5 S1 area & bilat SI joints.  FROM.  Nontender along paraspinal muscles.  Psych:  Cognition and judgment appear intact. Alert and cooperative with normal attention span and concentration. No apparent delusions, illusions, hallucinations   Impression & Recommendations:  Problem # 1:  PELVIC PAIN, ACUTE (ICD-789.09) Assessment New  Her updated medication list for this problem includes:    Ibuprofen 800 Mg Tabs (Ibuprofen) .Marland Kitchen... Take 1 tablet by mouth three times a day  Orders: Ketorolac-Toradol 15mg  (N5621) T-Urinalysis Dipstick only (30865HQ) T-Culture, Urine ([id number]) Chlamydia and GC Probe Amp, genital (13244-01027) T-Wet Prep for Christoper Allegra, Clue Cells (25366-44034) T-Chlamydia & GC Probe, Genital (87491/[id number]) Admin of Therapeutic Inj  intramuscular or subcutaneous (74259)  Problem # 2:  NAUSEA (ICD-787.02) Assessment: New Secondary to #1.  Her updated medication list for this problem includes:    Loratadine 10 Mg Tabs (Loratadine) .Marland Kitchen... Take 1 tablet by mouth once a day  Orders: Zofran 1mg . injection (D6387)  Complete Medication List: 1)  Diovan Hct 320-25 Mg Tabs (Valsartan-hydrochlorothiazide) .... Take 1 tablet by mouth once a day 2)  Loratadine 10 Mg Tabs (Loratadine) .... Take 1 tablet by mouth once  a day 3)  Altace 5 Mg Caps (Ramipril) .... Take 1 tablet by mouth once a day 4)  Ibuprofen 800 Mg Tabs (Ibuprofen) .... Take 1 tablet by mouth three times a day 5)  Phentermine Hcl 37.5 Mg Tabs (Phentermine hcl) .... Take 1 tablet by mouth  once a day 6)  Flagyl 500 Mg Tabs (Metronidazole) .... One tab by mouth bid 7)  Ciprofloxacin Hcl 500 Mg Tabs (Ciprofloxacin hcl) .... Take 1 tablet by mouth two times a day 8)  Fluconazole 150 Mg Tabs (Fluconazole) .... Take 1 tablet by mouth once a day as needed 9)  Metronidazole 500 Mg Tabs (Metronidazole) .... Take 1 tablet by mouth two times a day 10)  Fluconazole 150 Mg Tabs (Fluconazole) .... Take 1 tablet by mouth once a day as needed 11)  Metronidazole 500 Mg Tabs (Metronidazole) .... Take 1 two times a day 12)  Levaquin 500 Mg Tabs (Levofloxacin) .... Take 1 daily  Patient Instructions: 1)  Follow up in 1 week.  Sooner if worsens. 2)  You have received Toradol for pain and Zofran for nausea today. 3)  I have also prescribed 2 anitibiotics for you to take.  I recommend you take them with food. 4)  You may take Tylenol or Ibuprofen as needed for pain. Prescriptions: LEVAQUIN 500 MG TABS (LEVOFLOXACIN) take 1 daily  #7 x 0   Entered and Authorized by:   Esperanza Sheets PA   Signed by:   Esperanza Sheets PA on 10/06/2009   Method used:   Electronically to        [facility]. [address] [phone]* (retail)       [address] [place], [place]  [address]       Ph: [phone]       Fax: [phone]   RxID:   [id number] METRONIDAZOLE 500 MG TABS (METRONIDAZOLE) take 1 two times a day  #14 x 0   Entered and Authorized by:   Esperanza Sheets PA   Signed by:   Esperanza Sheets PA on 10/06/2009   Method used:   Electronically to        [facility] [phone]* (retail)       [address] [place], [place]  [address]       Ph: [phone]       Fax: [phone]   RxID:   [id number]   Laboratory Results   Urine Tests  Date/Time Received: March 28,2011 at 14:20  Routine Urinalysis   Color: yellow Appearance: Clear Glucose: negative   (Normal Range: Negative) Bilirubin: negative   (Normal Range: Negative) Ketone: negative   (Normal Range:  Negative) Spec. Gravity: >=1.030   (Normal Range: 1.003-1.035) Blood: negative   (Normal Range: Negative) pH: 6.0   (Normal Range: 5.0-8.0) Protein: trace   (Normal Range: Negative) Urobilinogen: 0.2   (Normal Range: 0-1) Nitrite: negative   (Normal Range: Negative) Leukocyte Esterace: trace   (Normal Range: Negative)        Medication Administration  Injection # 1:    Medication: Ketorolac-Toradol 15mg     Diagnosis: PELVIC PAIN, ACUTE (ICD-789.09)    Route: IM    Site: RUOQ gluteus    Exp Date: 02/2011    Lot #: [id number]    Mfr: [email or web address]    Comments: 60mg  given     Patient tolerated injection without complications  Injection #  2:    Medication: Zofran 1mg . injection    Diagnosis: NAUSEA (ICD-787.02)    Route: IM    Site: LUOQ gluteus    Exp Date: 01/2011    Lot #: [id number]    Mfr: novaplus    Comments: 4mg  given     Patient tolerated injection without complications    Given by: Everitt Amber LPN (October 06, 2009 4:39 PM)  Orders Added: 1)  Zofran 1mg . injection [J2405] 2)  Ketorolac-Toradol 15mg  [J1885] 3)  T-Urinalysis Dipstick only [81003QW] 4)  T-Culture, Urine [[id number]] 5)  Chlamydia and GC Probe Amp, genital [28413-24401] 6)  T-Wet Prep for Christoper Allegra, Clue Cells [02725-36644] 7)  T-Chlamydia & GC Probe, Genital [87491/[address]] 8)  Est. Patient Level IV [03474] 9)  Admin of Therapeutic Inj  intramuscular or subcutaneous [25956]

## 2010-08-11 NOTE — Letter (Signed)
Summary: xray  xray   Imported By: Curtis Sites 12/11/2009 13:42:24  _____________________________________________________________________  External Attachment:    Type:   Image     Comment:   External Document

## 2010-08-11 NOTE — Assessment & Plan Note (Signed)
Summary: uti symptoms with lower back pain   Vital Signs:  Patient Profile:   45 Years Old Female Height:     66 inches Weight:      234.44 pounds BMI:     37.98 Pulse rate:   76 / minute Resp:     16 per minute BP sitting:   130 / 80  (left arm)  Pt. in pain?   yes    Location:   lower back    Intensity:   10    Type:       sharp                  Chief Complaint:  uti symptoms with lower back pain.  History of Present Illness: Kristy Travis has a 4 day h/o low back pain, primarily in the middle, which is non radiating and has no associated lower extremity weakness or numbness. She also denies incontinence of stool or urine. She has had the symptom before, pain is aggravated by movement and has no specific factor which bought on the symptoms. The patient has a 2 week h/o urinary frquency.     Current Allergies: No known allergies      Review of Systems  ENT      Denies ear discharge, nasal congestion, sinus pressure, and sore throat.  CV      Denies chest pain or discomfort, palpitations, shortness of breath with exertion, swelling of feet, and swelling of hands.  Resp      Denies cough, shortness of breath, and sputum productive.  GI      Denies abdominal pain, change in bowel habits, constipation, and diarrhea.  GU      Denies dysuria and hematuria.   Physical Exam  General:     overweight-appearing.   Head:     Normocephalic and atraumatic without obvious abnormalities. No apparent alopecia or balding. Eyes:     vision grossly intact.   Ears:     R ear normal and L ear normal.   Nose:     no external deformity and no nasal discharge.   Mouth:     Oral mucosa and oropharynx without lesions or exudates.  Teeth in good repair. Neck:     No deformities, masses, or tenderness noted. Lungs:     Normal respiratory effort, chest expands symmetrically. Lungs are clear to auscultation, no crackles or wheezes. Heart:     Normal rate and regular  rhythm. S1 and S2 normal without gallop, murmur, click, rub or other extra sounds. Abdomen:     soft and non-tender.   Msk:     Decreased ROM thoracolumbar spine, with tenderness to palpation. Neurologic:     alert & oriented X3, cranial nerves II-XII intact, strength normal in all extremities, and sensation intact to light touch.      Impression & Recommendations:  Problem # 1:  BACK PAIN, LUMBAR (ICD-724.2) Assessment: Deteriorated  Her updated medication list for this problem includes:    Vicodin Es 7.5-750 Mg Tabs (Hydrocodone-acetaminophen) .Marland Kitchen... Take 1 tab by mouth at bedtime    Ibuprofen 800 Mg Tabs (Ibuprofen) .Marland Kitchen... Take 1 tablet by mouth three times a day    Soma 250 Mg Tabs (Carisoprodol) .Marland Kitchen... Take 1 tab by mouth at bedtime as needed  Orders: Ketorolac-Toradol 15mg  (I9518) Depo- Medrol 80mg  (J1040)   Problem # 2:  HYPERTENSION (ICD-401.9) Assessment: Unchanged  Her updated medication list for this problem includes:    Diovan Hct 320-25 Mg  Tabs (Valsartan-hydrochlorothiazide) .Marland Kitchen... Take 1 tablet by mouth once a day    Altace 5 Mg Caps (Ramipril) .Marland Kitchen... Take 1 tablet by mouth once a day   Problem # 3:  OBESITY (ICD-278.00) Assessment: Deteriorated  Problem # 4:  ACUTE CYSTITIS (ICD-595.0)  Encouraged to push clear liquids, get enough rest, and take acetaminophen as needed. To be seen in 10 days if no improvement, sooner if worse. CCUA in house is negative, pt. is reassured.   Complete Medication List: 1)  Diovan Hct 320-25 Mg Tabs (Valsartan-hydrochlorothiazide) .... Take 1 tablet by mouth once a day 2)  Loratadine 10 Mg Tabs (Loratadine) .... Take 1 tablet by mouth once a day 3)  Altace 5 Mg Caps (Ramipril) .... Take 1 tablet by mouth once a day 4)  Vicodin Es 7.5-750 Mg Tabs (Hydrocodone-acetaminophen) .... Take 1 tab by mouth at bedtime 5)  Ibuprofen 800 Mg Tabs (Ibuprofen) .... Take 1 tablet by mouth three times a day 6)  Prednisone 5 Mg Tabs (Prednisone)  .... Uad 7)  Soma 250 Mg Tabs (Carisoprodol) .... Take 1 tab by mouth at bedtime as needed   Patient Instructions: 1)  Please schedule a follow-up appointment in 3 months. 2)  Check your Blood Pressure regularly. If it is above150/95  you should make an appointment. 3)  It is important that you exercise regularly at least 20 minutes 5 times a week. If you develop chest pain, have severe difficulty breathing, or feel very tired , stop exercising immediately and seek medical attention. 4)  You need to lose weight. Consider a lower calorie diet and regular exercise.  5)  Schedule your mammogram. 6)  Most patients (90%) with low back pain will improve with time (2-6 weeks). Keep active but avoid activities that are painful. Apply moist heat and/or ice to lower back several times a day. 7)  Please call if your back pain persists, I will refer you for physical therapy.   Prescriptions: SOMA 250 MG  TABS (CARISOPRODOL) Take 1 tab by mouth at bedtime as needed  #30 x 1   Entered by:   Chipper Herb   Authorized by:   Syliva Overman MD   Signed by:   Chipper Herb on 01/15/2008   Method used:   Handwritten   RxID:   [id number] PREDNISONE 5 MG  TABS (PREDNISONE) UAD  #6 day pack x 0   Entered by:   Chipper Herb   Authorized by:   Syliva Overman MD   Signed by:   Chipper Herb on 01/15/2008   Method used:   Handwritten   RxID:   [id number] IBUPROFEN 800 MG  TABS (IBUPROFEN) Take 1 tablet by mouth three times a day  #30 x 0   Entered by:   Chipper Herb   Authorized by:   Syliva Overman MD   Signed by:   Chipper Herb on 01/15/2008   Method used:   Handwritten   RxID:   [id number] VICODIN ES 7.5-750 MG  TABS (HYDROCODONE-ACETAMINOPHEN) Take 1 tab by mouth at bedtime  #20 x 0   Entered by:   Chipper Herb   Authorized by:   Syliva Overman MD   Signed by:   Chipper Herb on 01/15/2008   Method used:   Handwritten   RxID:   [id number]  ]  Laboratory Results   Urine Tests  Date/Time Received: January 15, 2008 1:04 PM Date/Time Reported: January 15, 2008 1:04 PM  Routine Urinalysis   Color: yellow  Appearance: Clear Glucose: negative   (Normal Range: Negative) Bilirubin: negative   (Normal Range: Negative) Ketone: negative   (Normal Range: Negative) Spec. Gravity: 1.025   (Normal Range: 1.003-1.035) Blood: negative   (Normal Range: Negative) pH: 7.0   (Normal Range: 5.0-8.0) Protein: negative   (Normal Range: Negative) Urobilinogen: negative   (Normal Range: 0-1) Nitrite: negative   (Normal Range: Negative) Leukocyte Esterace: negative   (Normal Range: Negative)         Medication Administration  Injection # 1:    Medication: Ketorolac-Toradol 15mg     Diagnosis: BACK PAIN, LUMBAR (ICD-724.2)    Route: IM    Site: RUOQ gluteus    Exp Date: 08/12/2009    Lot #: [id number]    Mfr: [email or web address]    Comments: 60 mg given    Patient tolerated injection without complications    Given by: Chipper Herb (January 15, 2008 1:09 PM)  Injection # 2:    Medication: Depo- Medrol 80mg     Diagnosis: BACK PAIN, LUMBAR (ICD-724.2)    Route: IM    Site: LUOQ gluteus    Exp Date: 02/18/2009    Lot #: [id number]    Mfr: sicor    Patient tolerated injection without complications    Given by: Chipper Herb (January 15, 2008 1:09 PM)  Orders Added: 1)  Ketorolac-Toradol 15mg  [J1885] 2)  Depo- Medrol 80mg  [J1040]

## 2010-08-11 NOTE — Assessment & Plan Note (Signed)
Summary: OV   Vital Signs:  Patient profile:   45 year old female Menstrual status:  irregular Height:      66 inches Weight:      231.38 pounds BMI:     37.48 Pulse rate:   82 / minute Pulse rhythm:   regular Resp:     16 per minute BP sitting:   126 / 82  (left arm)  Vitals Entered By: Worthy Keeler LPN (March 12, 2009 2:35 PM)  Nutrition Counseling: Patient's BMI is greater than 25 and therefore counseled on weight management options. CC: follow-up visit Is Patient Diabetic? No Pain Assessment Patient in pain? no        CC:  follow-up visit.  History of Present Illness: Patient reports doing well.  Denies any recent fever or chills.  Denies any appetite change or change in bowel movements. Patient denies depression, anxiety or insomnia.  sHE DENIES HEAD ORCHEST CONGESTION, DYSURIA OR FREQUENCY. sHE HS BEE ON PHENTERMINE, DENIES ANY ADVERSE SIDE EFFECTS AND REPORTS THAT IT DOES HELP WITH APETITE SUPPRESSION.  Allergies (verified): No Known Drug Allergies  Past History:  Past Surgical History: laparoscopy to eval dyspepsia 2004 d/c cyst removed from left foot and bone removed from left foot  in aprl and june 2010  Review of Systems General:  Complains of fatigue; denies chills and fever. ENT:  Denies hoarseness, nasal congestion, sinus pressure, and sore throat. CV:  Denies chest pain or discomfort, near fainting, palpitations, and swelling of feet. Resp:  Denies cough and sputum productive. GI:  Denies abdominal pain, constipation, nausea, and vomiting. GU:  Denies dysuria and urinary frequency. MS:  Complains of joint pain and stiffness; pt hAD 2 SURGERIES ON HER FOOT SINCE HER LAST VISIT, FROM WHICH SHE ISRECVERING WELL.. Derm:  Denies itching, lesion(s), and rash. Neuro:  Denies headaches, poor balance, seizures, and sensation of room spinning. Psych:  Denies anxiety and depression. Endo:  Denies cold intolerance, excessive hunger, excessive thirst,  excessive urination, heat intolerance, polyuria, and weight change. Heme:  Denies abnormal bruising and bleeding. Allergy:  Complains of seasonal allergies.  Physical Exam  General:  alert, well-hydrated, and overweight-appearing. HEENT: No facial asymmetry,  EOMI, No sinus tenderness, TM's Clear, oropharynx  pink and moist.   Chest: Clear to auscultation bilaterally.  CVS: S1, S2, No murmurs, No S3.   Abd: Soft, Nontender.  MS: Adequate ROM spine, hips, shoulders and knees.  Ext: No edema.   CNS: CN 2-12 intact, power tone and sensation normal throughout.   Skin: Intact, no visible lesions or rashes.  Psych: Good eye contact, normal affect.  Memory intact, not anxious or depressed appearing.      Impression & Recommendations:  Problem # 1:  HYPERTENSION (ICD-401.9) Assessment Unchanged  Her updated medication list for this problem includes:    Diovan Hct 320-25 Mg Tabs (Valsartan-hydrochlorothiazide) .Marland Kitchen... Take 1 tablet by mouth once a day    Altace 5 Mg Caps (Ramipril) .Marland Kitchen... Take 1 tablet by mouth once a day  Orders: T-Basic Metabolic Panel [id number])  BP today: 126/82 Prior BP: 124/80 (09/26/2008)  Labs Reviewed: K+: 4.1 (01/06/2008) Creat: : 0.62 (01/06/2008)   Chol: 198 (01/06/2008)   HDL: 60 (01/06/2008)   LDL: 124 (01/06/2008)   TG: 68 (01/06/2008)  Problem # 2:  OBESITY (ICD-278.00) Assessment: Improved  Ht: 66 (03/12/2009)   Wt: 231.38 (03/12/2009)   BMI: 37.48 (03/12/2009)  Complete Medication List: 1)  Diovan Hct 320-25 Mg Tabs (Valsartan-hydrochlorothiazide) .... Take 1  tablet by mouth once a day 2)  Loratadine 10 Mg Tabs (Loratadine) .... Take 1 tablet by mouth once a day 3)  Altace 5 Mg Caps (Ramipril) .... Take 1 tablet by mouth once a day 4)  Ibuprofen 800 Mg Tabs (Ibuprofen) .... Take 1 tablet by mouth three times a day 5)  Metronidazole 500 Mg Tabs (Metronidazole) .... One tab by mouth bid 6)  Phentermine Hcl 37.5 Mg Tabs (Phentermine hcl) ....  Take 1 tablet by mouth once a day  Other Orders: T-Lipid Profile ([id number]) T-CBC w/Diff ([id number]) T-TSH [phone])  Patient Instructions: 1)  Please schedule a complete physical exam iin 2 months. 2)  You need to lose weight. Consider a lower calorie diet and regular exercise. goal is 10 pounds. 3)  pls follow a low cal diet rich in fruit and vegetable, fresh or frozen fruit, and drink alot of water. 4)  BMP prior to visit, ICD-9: 5)  Lipid Panel prior to visit, ICD-9:  fasting  asap 6)  TSH prior to visit, ICD-9: 7)  CBC w/ Diff prior to visit, ICD-9: Prescriptions: PHENTERMINE HCL 37.5 MG TABS (PHENTERMINE HCL) Take 1 tablet by mouth once a day  #30 x 1   Entered and Authorized by:   Syliva Overman MD   Signed by:   Syliva Overman MD on 03/12/2009   Method used:   Printed then faxed to ...       [facility] [phone]* (retail)       [address] [place], [place]  [address]       Ph: [phone]       Fax: [phone]   RxID:   [id number]

## 2010-09-02 ENCOUNTER — Other Ambulatory Visit: Payer: Self-pay | Admitting: Family Medicine

## 2010-09-02 ENCOUNTER — Encounter: Payer: Self-pay | Admitting: Family Medicine

## 2010-09-02 ENCOUNTER — Encounter (INDEPENDENT_AMBULATORY_CARE_PROVIDER_SITE_OTHER): Payer: BC Managed Care – PPO | Admitting: Family Medicine

## 2010-09-02 DIAGNOSIS — R5381 Other malaise: Secondary | ICD-10-CM

## 2010-09-02 DIAGNOSIS — E8881 Metabolic syndrome: Secondary | ICD-10-CM

## 2010-09-02 DIAGNOSIS — E669 Obesity, unspecified: Secondary | ICD-10-CM

## 2010-09-02 DIAGNOSIS — I10 Essential (primary) hypertension: Secondary | ICD-10-CM

## 2010-09-02 DIAGNOSIS — E049 Nontoxic goiter, unspecified: Secondary | ICD-10-CM

## 2010-09-02 DIAGNOSIS — M2550 Pain in unspecified joint: Secondary | ICD-10-CM | POA: Insufficient documentation

## 2010-09-02 DIAGNOSIS — M129 Arthropathy, unspecified: Secondary | ICD-10-CM

## 2010-09-02 HISTORY — DX: Metabolic syndrome: E88.81

## 2010-09-02 HISTORY — DX: Metabolic syndrome: E88.810

## 2010-09-07 ENCOUNTER — Encounter: Payer: Self-pay | Admitting: Family Medicine

## 2010-09-07 ENCOUNTER — Ambulatory Visit (HOSPITAL_COMMUNITY): Payer: BC Managed Care – PPO

## 2010-09-08 ENCOUNTER — Encounter: Payer: Self-pay | Admitting: Family Medicine

## 2010-09-08 LAB — CONVERTED CEMR LAB
ALT: 13 units/L (ref 0–35)
AST: 15 units/L (ref 0–37)
Albumin: 4.5 g/dL (ref 3.5–5.2)
Alkaline Phosphatase: 64 units/L (ref 39–117)
BUN: 13 mg/dL (ref 6–23)
Basophils Absolute: 0 10*3/uL (ref 0.0–0.1)
Basophils Relative: 0 % (ref 0–1)
Bilirubin, Direct: 0.1 mg/dL (ref 0.0–0.3)
CO2: 27 meq/L (ref 19–32)
Calcium: 10.4 mg/dL (ref 8.4–10.5)
Chloride: 103 meq/L (ref 96–112)
Cholesterol: 244 mg/dL — ABNORMAL HIGH (ref 0–200)
Creatinine, Ser: 0.69 mg/dL (ref 0.40–1.20)
Eosinophils Absolute: 0.1 10*3/uL (ref 0.0–0.7)
Eosinophils Relative: 1 % (ref 0–5)
Glucose, Bld: 92 mg/dL (ref 70–99)
HCT: 38.9 % (ref 36.0–46.0)
HDL: 65 mg/dL (ref >39)
Hemoglobin: 12.4 g/dL (ref 12.0–15.0)
Hgb A1c MFr Bld: 7.6 % — ABNORMAL HIGH (ref <5.7)
Indirect Bilirubin: 0.2 mg/dL (ref 0.0–0.9)
LDL Cholesterol: 167 mg/dL — ABNORMAL HIGH (ref 0–99)
Lymphocytes Relative: 31 % (ref 12–46)
Lymphs Abs: 1.7 10*3/uL (ref 0.7–4.0)
MCHC: 31.9 g/dL (ref 30.0–36.0)
MCV: 81.6 fL (ref 78.0–100.0)
Monocytes Absolute: 0.4 10*3/uL (ref 0.1–1.0)
Monocytes Relative: 6 % (ref 3–12)
Neutro Abs: 3.5 10*3/uL (ref 1.7–7.7)
Neutrophils Relative %: 62 % (ref 43–77)
Platelets: 311 10*3/uL (ref 150–400)
Potassium: 4.4 meq/L (ref 3.5–5.3)
RBC: 4.77 M/uL (ref 3.87–5.11)
RDW: 14 % (ref 11.5–15.5)
Rhuematoid fact SerPl-aCnc: <10 intl units/mL (ref <=14)
Sed Rate: 18 mm/hr (ref 0–22)
Sodium: 139 meq/L (ref 135–145)
TSH: 1.47 microintl units/mL (ref 0.350–4.500)
Total Bilirubin: 0.3 mg/dL (ref 0.3–1.2)
Total CHOL/HDL Ratio: 3.8
Total Protein: 7.4 g/dL (ref 6.0–8.3)
Triglycerides: 60 mg/dL (ref <150)
VLDL: 12 mg/dL (ref 0–40)
Vit D, 25-Hydroxy: 16 ng/mL — ABNORMAL LOW (ref 30–89)
WBC: 5.6 10*3/uL (ref 4.0–10.5)

## 2010-09-08 NOTE — Assessment & Plan Note (Signed)
Summary: PHY   Vital Signs:  Patient profile:   45 year old female Menstrual status:  irregular Height:      65 inches Weight:      221 pounds BMI:     36.91 Pulse rhythm:   regular Resp:     16 per minute BP sitting:   124 / 86  (left arm) Cuff size:   large  Vitals Entered By: Everitt Amber LPN (September 02, 2010 1:54 PM)  Nutrition Counseling: Patient's BMI is greater than 25 and therefore counseled on weight management options. CC: Follow up chronic problems, already had pap and mamo   Primary Care Provider:  Syliva Overman MD  CC:  Follow up chronic problems and already had pap and mamo.  History of Present Illness: Treated for sore throat wth a z pack, states she now has diarreah, up to 3 water stoolsstarting today daughter has stomach virus. Pt reports uncontrolled and severe fatigue, has absolutely no energy. Denies uncontrolled depression C/o generalised joint pains    Current Medications (verified): 1)  None  Allergies (verified): No Known Drug Allergies  Review of Systems      See HPI General:  Complains of fatigue. Eyes:  Denies discharge and red eye. Endo:  Complains of weight change. Heme:  Denies abnormal bruising and bleeding. Allergy:  Denies hives or rash and itching eyes.  Physical Exam  General:  Well-developed,well-nourished,in no acute distress; alert,appropriate and cooperative throughout examination HEENT: No facial asymmetry,  EOMI, No sinus tenderness, TM's Clear, oropharynx  pink and moist.   Chest: Clear to auscultation bilaterally.  CVS: S1, S2, No murmurs, No S3.   Abd: Soft, Nontender.  MS: Adequate ROM spine, hips, shoulders and knees.  Ext: No edema.   CNS: CN 2-12 intact, power tone and sensation normal throughout.   Skin: Intact, no visible lesions or rashes.  Psych: Good eye contact, normal affect.  Memory intact, not anxious or depressed appearing.    Impression & Recommendations:  Problem # 1:  IMPAIRED FASTING  GLUCOSE (ICD-790.21) Assessment Comment Only  Her updated medication list for this problem includes:    Janumet 50-500 Mg Tabs (Sitagliptin-metformin hcl) .Marland Kitchen... Take 1 tablet by mouth two times a day  Orders: T- Hemoglobin A1C (04540-98119)  Problem # 2:  PAIN IN JOINT, MULTIPLE SITES (ICD-719.49) Assessment: Deteriorated  Orders: TLB-Sedimentation Rate (ESR) (85652-ESR) T-Rheumatoid Factor ([id number])  Problem # 3:  GOITER (ICD-240.9) Assessment: Comment Only  Orders: Radiology Referral (Radiology)  Problem # 4:  HYPERLIPIDEMIA (ICD-272.4) Assessment: Deteriorated  Her updated medication list for this problem includes:    Crestor 10 Mg Tabs (Rosuvastatin calcium) .Marland Kitchen... Take 1 tab by mouth at bedtime Low fat dietdiscussed and encouraged  Orders: T-Hepatic Function [phone]) T-Lipid Profile (62952-84132)    HDL:56 (05/14/2009), 60 (01/06/2008)  LDL:135 (05/14/2009), 124 (01/06/2008)  Chol:212 (05/14/2009), 198 (01/06/2008)  Trig:105 (05/14/2009), 68 (01/06/2008)  Problem # 5:  HYPERTENSION (ICD-401.9) Assessment: Unchanged  The following medications were removed from the medication list:    Diovan Hct 320-25 Mg Tabs (Valsartan-hydrochlorothiazide) .Marland Kitchen... Take 1 tablet by mouth once a day    Altace 5 Mg Caps (Ramipril) .Marland Kitchen... Take 1 tablet by mouth once a day  Orders: T-Basic Metabolic Panel [phone])  BP today: 124/86 Prior BP: 126/80 (02/06/2010)  Labs Reviewed: K+: 4.1 (05/14/2009) Creat: : 0.62 (05/14/2009)   Chol: 212 (05/14/2009)   HDL: 56 (05/14/2009)   LDL: 135 (05/14/2009)   TG: 105 (05/14/2009)  Complete Medication List: 1)  Phentermine  Hcl 37.5 Mg Tabs (Phentermine hcl) .... Take 1 tablet by mouth once a day 2)  Janumet 50-500 Mg Tabs (Sitagliptin-metformin hcl) .... Take 1 tablet by mouth two times a day 3)  Crestor 10 Mg Tabs (Rosuvastatin calcium) .... Take 1 tab by mouth at bedtime  Other Orders: T-CBC w/Diff (04540-98119) T-TSH  [phone])  Patient Instructions: 1)  Please schedule a follow-up appointment in 2 months. 2)  It is important that you exercise regularly at least 30 minutes 5 times a week. If you develop chest pain, have severe difficulty breathing, or feel very tired , stop exercising immediately and seek medical attention. 3)  You need to lose weight. Consider a lower calorie diet and regular exercise.  4)  BMP prior to visit, ICD-9: 5)  Hepatic Panel prior to visit, ICD-9: 6)  Lipid Panel prior to visit, ICD-9: 7)  TSH prior to visit, ICD-9: 8)  CBC w/ Diff prior to visit, ICD-9:   fasting asap 9)  VitD 10)  HbgA1C prior to visit, ICD-9:ESR, rgheumatoid factor 11)  you will be referred for a thyroid US.Marland KitchenPls start phentermine HALF daily as well Prescriptions: CRESTOR 10 MG TABS (ROSUVASTATIN CALCIUM) Take 1 tab by mouth at bedtime  #30 x 3   Entered and Authorized by:   Syliva Overman MD   Signed by:   Syliva Overman MD on 09/05/2010   Method used:   Historical   RxID:   [id number] JANUMET 50-500 MG TABS (SITAGLIPTIN-METFORMIN HCL) Take 1 tablet by mouth two times a day  #60 x 3   Entered and Authorized by:   Syliva Overman MD   Signed by:   Syliva Overman MD on 09/05/2010   Method used:   Historical   RxID:   [id number] PHENTERMINE HCL 37.5 MG TABS (PHENTERMINE HCL) Take 1 tablet by mouth once a day  #30 x 0   Entered and Authorized by:   Syliva Overman MD   Signed by:   Syliva Overman MD on 09/02/2010   Method used:   Printed then faxed to ...       [facility]. [phone]* (retail)       [address] [place], [place]  [address]       Ph: [phone]       Fax: [phone]   RxID:   [id number]    Orders Added: 1)  Est. Patient Level IV [99214] 2)  T-Basic Metabolic Panel [phone] 3)  T-Hepatic Function [80076-22960] 4)  T-Lipid Profile [80061-22930] 5)  T-CBC w/Diff [62376-28315] 6)  T-TSH [17616-07371] 7)  T-  Hemoglobin A1C [83036-23375] 8)  TLB-Sedimentation Rate (ESR) [85652-ESR] 9)  T-Rheumatoid Factor [06269-48546] 10)  Radiology Referral [Radiology]

## 2010-09-09 ENCOUNTER — Telehealth: Payer: Self-pay | Admitting: Family Medicine

## 2010-09-10 ENCOUNTER — Telehealth: Payer: Self-pay | Admitting: Family Medicine

## 2010-09-10 ENCOUNTER — Emergency Department (HOSPITAL_COMMUNITY)
Admission: EM | Admit: 2010-09-10 | Discharge: 2010-09-10 | Disposition: A | Discharge status: Home / Self Care | Payer: BC Managed Care – PPO | Attending: Emergency Medicine | Admitting: Emergency Medicine

## 2010-09-10 DIAGNOSIS — R42 Dizziness and giddiness: Secondary | ICD-10-CM | POA: Insufficient documentation

## 2010-09-10 DIAGNOSIS — E119 Type 2 diabetes mellitus without complications: Secondary | ICD-10-CM | POA: Insufficient documentation

## 2010-09-10 DIAGNOSIS — R112 Nausea with vomiting, unspecified: Secondary | ICD-10-CM | POA: Insufficient documentation

## 2010-09-10 DIAGNOSIS — K5289 Other specified noninfective gastroenteritis and colitis: Secondary | ICD-10-CM | POA: Insufficient documentation

## 2010-09-10 LAB — COMPREHENSIVE METABOLIC PANEL
ALT: 20 U/L (ref 0–35)
AST: 19 U/L (ref 0–37)
Albumin: 3.5 g/dL (ref 3.5–5.2)
Alkaline Phosphatase: 53 U/L (ref 39–117)
BUN: 10 mg/dL (ref 6–23)
CO2: 27 mEq/L (ref 19–32)
Calcium: 9.1 mg/dL (ref 8.4–10.5)
Chloride: 103 mEq/L (ref 96–112)
Creatinine, Ser: 0.59 mg/dL (ref 0.4–1.2)
GFR calc Af Amer: >60 mL/min (ref >60)
GFR calc non Af Amer: >60 mL/min (ref >60)
Glucose, Bld: 92 mg/dL (ref 70–99)
Potassium: 3.3 mEq/L — ABNORMAL LOW (ref 3.5–5.1)
Sodium: 138 mEq/L (ref 135–145)
Total Bilirubin: 0.3 mg/dL (ref 0.3–1.2)
Total Protein: 6.5 g/dL (ref 6.0–8.3)

## 2010-09-10 LAB — URINALYSIS, ROUTINE W REFLEX MICROSCOPIC
Bilirubin Urine: NEGATIVE
Hgb urine dipstick: NEGATIVE
Ketones, ur: 15 mg/dL — AB
Nitrite: NEGATIVE
Protein, ur: NEGATIVE mg/dL
Specific Gravity, Urine: 1.015 (ref 1.005–1.030)
Urine Glucose, Fasting: NEGATIVE mg/dL
Urobilinogen, UA: 0.2 mg/dL (ref 0.0–1.0)
pH: 7 (ref 5.0–8.0)

## 2010-09-10 LAB — DIFFERENTIAL
Basophils Absolute: 0 10*3/uL (ref 0.0–0.1)
Basophils Relative: 1 % (ref 0–1)
Eosinophils Absolute: 0.1 10*3/uL (ref 0.0–0.7)
Eosinophils Relative: 1 % (ref 0–5)
Lymphocytes Relative: 29 % (ref 12–46)
Lymphs Abs: 1.6 10*3/uL (ref 0.7–4.0)
Monocytes Absolute: 0.5 10*3/uL (ref 0.1–1.0)
Monocytes Relative: 9 % (ref 3–12)
Neutro Abs: 3.2 10*3/uL (ref 1.7–7.7)
Neutrophils Relative %: 60 % (ref 43–77)

## 2010-09-10 LAB — CBC
HCT: 37.9 % (ref 36.0–46.0)
Hemoglobin: 12.3 g/dL (ref 12.0–15.0)
MCH: 26.2 pg (ref 26.0–34.0)
MCHC: 32.5 g/dL (ref 30.0–36.0)
MCV: 80.8 fL (ref 78.0–100.0)
Platelets: 264 10*3/uL (ref 150–400)
RBC: 4.69 MIL/uL (ref 3.87–5.11)
RDW: 13.7 % (ref 11.5–15.5)
WBC: 5.3 10*3/uL (ref 4.0–10.5)

## 2010-09-10 LAB — GLUCOSE, CAPILLARY: Glucose-Capillary: 86 mg/dL (ref 70–99)

## 2010-09-11 ENCOUNTER — Ambulatory Visit: Payer: BC Managed Care – PPO

## 2010-09-17 NOTE — Letter (Signed)
Summary: CERTIFIED MAIL RECIEPT  CERTIFIED MAIL RECIEPT   Imported By: Lind Guest 09/08/2010 16:33:14  _____________________________________________________________________  External Attachment:    Type:   Image     Comment:   External Document

## 2010-09-17 NOTE — Letter (Signed)
Summary: certified letter  certified letter   Imported By: Lind Guest 09/08/2010 08:26:11  _____________________________________________________________________  External Attachment:    Type:   Image     Comment:   External Document

## 2010-09-17 NOTE — Miscellaneous (Signed)
  Clinical Lists Changes  Medications: Added new medication of VITAMIN D (ERGOCALCIFEROL) 50000 UNIT CAPS (ERGOCALCIFEROL) one capsule once weekly - Signed Rx of VITAMIN D (ERGOCALCIFEROL) 50000 UNIT CAPS (ERGOCALCIFEROL) one capsule once weekly;  #12 x 1;  Signed;  Entered by: Syliva Overman MD;  Authorized by: Syliva Overman MD;  Method used: Historical    Prescriptions: VITAMIN D (ERGOCALCIFEROL) 50000 UNIT CAPS (ERGOCALCIFEROL) one capsule once weekly  #12 x 1   Entered and Authorized by:   Syliva Overman MD   Signed by:   Syliva Overman MD on 09/08/2010   Method used:   Historical   RxID:   [id number]

## 2010-09-17 NOTE — Letter (Signed)
Summary: lab add on  lab add on   Imported By: Luann Bullins 09/08/2010 13:54:20  _____________________________________________________________________  External Attachment:    Type:   Image     Comment:   External Document

## 2010-09-17 NOTE — Progress Notes (Signed)
Summary: sick  Phone Note Call from Patient   Summary of Call: needs to speak with nurse. asking for doc. due to being sick [phone] Initial call taken by: Rudene Anda,  September 09, 2010 10:41 AM  Follow-up for Phone Call        patient states she had stomach flu saturday, and is very dehydrated, state she gets dizzy upon standing and has nausea Follow-up by: Adella Hare LPN,  September 09, 2010 1:16 PM  Additional Follow-up for Phone Call Additional follow up Details #1::        advised patient er for eval  if patient is as dehydrated as she feels she is they will be able to administer iv fluids Additional Follow-up by: Adella Hare LPN,  September 09, 2010 1:18 PM

## 2010-09-22 NOTE — Progress Notes (Signed)
  Phone Note Call from Patient   Summary of Call: Patient is coming in for diabetic teaching tomorrow but she went ahead and got her Metformin but she was reading about it and said that she wants something else to take because it has a side effect of pancreatic cancer and she wants nothing to do with that. Initial call taken by: Everitt Amber LPN,  September 10, 2010 8:17 AM  Follow-up for Phone Call        pls advise I checked the hosp pharmacisit, metformin REDUCES  the risk of pancreatic cancer, it is safe to take and a good drug, I am sending for the info to be faxed over she can collect when sghe comes in Follow-up by: Syliva Overman MD,  September 10, 2010 1:08 PM  Additional Follow-up for Phone Call Additional follow up Details #1::        called patient no answer. Will tell her when she comes tomorrow for diabetic ed Additional Follow-up by: Everitt Amber LPN,  September 10, 2010 1:23 PM

## 2011-08-10 ENCOUNTER — Ambulatory Visit: Payer: Self-pay | Admitting: Internal Medicine

## 2011-08-10 LAB — RAPID STREP-A WITH REFLX: Micro Text Report: NEGATIVE

## 2011-08-12 LAB — BETA STREP CULTURE(ARMC)

## 2011-08-26 ENCOUNTER — Encounter: Payer: Self-pay | Admitting: Family Medicine

## 2011-08-26 ENCOUNTER — Ambulatory Visit (INDEPENDENT_AMBULATORY_CARE_PROVIDER_SITE_OTHER): Payer: BC Managed Care – PPO | Admitting: Family Medicine

## 2011-08-26 VITALS — BP 138/82 | HR 82 | Resp 16 | Ht 65.0 in

## 2011-08-26 DIAGNOSIS — R7301 Impaired fasting glucose: Secondary | ICD-10-CM

## 2011-08-26 DIAGNOSIS — R5381 Other malaise: Secondary | ICD-10-CM

## 2011-08-26 DIAGNOSIS — Z139 Encounter for screening, unspecified: Secondary | ICD-10-CM

## 2011-08-26 DIAGNOSIS — E559 Vitamin D deficiency, unspecified: Secondary | ICD-10-CM

## 2011-08-26 DIAGNOSIS — I10 Essential (primary) hypertension: Secondary | ICD-10-CM

## 2011-08-26 DIAGNOSIS — E669 Obesity, unspecified: Secondary | ICD-10-CM

## 2011-08-26 DIAGNOSIS — E785 Hyperlipidemia, unspecified: Secondary | ICD-10-CM

## 2011-08-26 NOTE — Patient Instructions (Signed)
CPE in 10 weeks   Fasting cBc, lipid, cmp, HBa1C, TSH and VIt D today.  Mammogram needs to be scheduled, you have not had one in 3 years  Blood pressure is normal...GREAT

## 2011-08-27 ENCOUNTER — Encounter: Payer: Self-pay | Admitting: Family Medicine

## 2011-08-27 ENCOUNTER — Other Ambulatory Visit: Payer: Self-pay

## 2011-08-27 DIAGNOSIS — E785 Hyperlipidemia, unspecified: Secondary | ICD-10-CM

## 2011-08-27 DIAGNOSIS — E559 Vitamin D deficiency, unspecified: Secondary | ICD-10-CM | POA: Insufficient documentation

## 2011-08-27 LAB — VITAMIN D 25 HYDROXY (VIT D DEFICIENCY, FRACTURES): Vit D, 25-Hydroxy: 16 ng/mL — ABNORMAL LOW (ref 30–89)

## 2011-08-27 LAB — COMPLETE METABOLIC PANEL WITH GFR
ALT: 14 U/L (ref 0–35)
AST: 15 U/L (ref 0–37)
Albumin: 4.4 g/dL (ref 3.5–5.2)
Alkaline Phosphatase: 67 U/L (ref 39–117)
BUN: 14 mg/dL (ref 6–23)
CO2: 25 mEq/L (ref 19–32)
Calcium: 9.6 mg/dL (ref 8.4–10.5)
Chloride: 105 mEq/L (ref 96–112)
Creat: 0.63 mg/dL (ref 0.50–1.10)
GFR, Est African American: >89 mL/min
GFR, Est Non African American: >89 mL/min
Glucose, Bld: 81 mg/dL (ref 70–99)
Potassium: 4.3 mEq/L (ref 3.5–5.3)
Sodium: 143 mEq/L (ref 135–145)
Total Bilirubin: 0.4 mg/dL (ref 0.3–1.2)
Total Protein: 6.9 g/dL (ref 6.0–8.3)

## 2011-08-27 LAB — CBC WITH DIFFERENTIAL/PLATELET
Basophils Absolute: 0 10*3/uL (ref 0.0–0.1)
Basophils Relative: 0 % (ref 0–1)
Eosinophils Absolute: 0 10*3/uL (ref 0.0–0.7)
Eosinophils Relative: 1 % (ref 0–5)
HCT: 40.7 % (ref 36.0–46.0)
Hemoglobin: 12.7 g/dL (ref 12.0–15.0)
Lymphocytes Relative: 26 % (ref 12–46)
Lymphs Abs: 2.1 10*3/uL (ref 0.7–4.0)
MCH: 25.8 pg — ABNORMAL LOW (ref 26.0–34.0)
MCHC: 31.2 g/dL (ref 30.0–36.0)
MCV: 82.7 fL (ref 78.0–100.0)
Monocytes Absolute: 0.5 10*3/uL (ref 0.1–1.0)
Monocytes Relative: 6 % (ref 3–12)
Neutro Abs: 5.5 10*3/uL (ref 1.7–7.7)
Neutrophils Relative %: 67 % (ref 43–77)
Platelets: 333 10*3/uL (ref 150–400)
RBC: 4.92 MIL/uL (ref 3.87–5.11)
RDW: 14.2 % (ref 11.5–15.5)
WBC: 8.2 10*3/uL (ref 4.0–10.5)

## 2011-08-27 LAB — HEMOGLOBIN A1C
Hgb A1c MFr Bld: 5.9 % — ABNORMAL HIGH (ref <5.7)
Mean Plasma Glucose: 123 mg/dL — ABNORMAL HIGH (ref <117)

## 2011-08-27 LAB — TSH: TSH: 2.156 u[IU]/mL (ref 0.350–4.500)

## 2011-08-27 LAB — LIPID PANEL
Cholesterol: 250 mg/dL — ABNORMAL HIGH (ref 0–200)
HDL: 55 mg/dL (ref >39)
LDL Cholesterol: 174 mg/dL — ABNORMAL HIGH (ref 0–99)
Total CHOL/HDL Ratio: 4.5 Ratio
Triglycerides: 103 mg/dL (ref <150)
VLDL: 21 mg/dL (ref 0–40)

## 2011-08-27 MED ORDER — PRAVASTATIN SODIUM 40 MG PO TABS: 40.0000 mg | ORAL_TABLET | Freq: Every evening | ORAL | Status: DC

## 2011-08-27 MED ORDER — VITAMIN D3 1.25 MG (50000 UT) PO CAPS: 50000.0000 [IU] | ORAL_CAPSULE | ORAL | Status: DC

## 2011-08-27 NOTE — Assessment & Plan Note (Signed)
Patient re-educated about  the importance of commitment to a  minimum of 150 minutes of exercise per week. The importance of healthy food choices with portion control discussed. Encouraged to start a food diary, count calories and to consider  joining a support group. Sample diet sheets offered. Goals set by the patient for the next several months.    

## 2011-08-27 NOTE — Progress Notes (Signed)
  Subjective:    Patient ID: Kristy Travis, female    DOB: February 01, 1966, 46 y.o.   MRN: [id number]  HPI The PT is here for follow up and re-evaluation of chronic medical conditions, medication management and review of any available recent lab and radiology data.  Preventive health is updated, specifically  Cancer screening and Immunization.  Mammogram and pap are past due. She is recovering from foot surgery, and is out of work on sick leave. She has been on no medication, and though i was convinced that she was diabetic when I last saw her she has done nothing about this.    Review of Systems See HPI Denies recent fever or chills. Denies sinus pressure, nasal congestion, ear pain or sore throat. Denies chest congestion, productive cough or wheezing. Denies chest pains, palpitations and leg swelling Denies abdominal pain, nausea, vomiting,diarrhea or constipation.   Denies dysuria, frequency, hesitancy or incontinence. Denies headaches, seizures, numbness, or tingling. Denies depression, anxiety or insomnia. Denies skin break down or rash.        Objective:   Physical Exam Patient alert and oriented and in no cardiopulmonary distress.  HEENT: No facial asymmetry, EOMI, no sinus tenderness,  oropharynx pink and moist.  Neck supple no adenopathy.  Chest: Clear to auscultation bilaterally.  CVS: S1, S2 no murmurs, no S3.  ABD: Soft non tender. Bowel sounds normal.  Ext: No edema  MS: Adequate ROM spine, shoulders, hips and knees.  Skin: Intact, no ulcerations or rash noted.  Psych: Good eye contact, normal affect. Memory intact not anxious or depressed appearing.  CNS: CN 2-12 intact, power, tone and sensation normal throughout.        Assessment & Plan:

## 2011-08-27 NOTE — Assessment & Plan Note (Signed)
Minimally elevated HBA1C , lifestyle change to address this

## 2011-08-27 NOTE — Assessment & Plan Note (Signed)
Hyperlipidemia:Low fat diet discussed and encouraged.  Start medication

## 2011-08-30 ENCOUNTER — Telehealth: Payer: Self-pay | Admitting: Family Medicine

## 2011-08-30 MED ORDER — VITAMIN D (ERGOCALCIFEROL) 1.25 MG (50000 UNIT) PO CAPS: 50000.0000 [IU] | ORAL_CAPSULE | ORAL | Status: DC

## 2011-08-30 NOTE — Telephone Encounter (Signed)
Sent the correct one in. The one that was sent was the OTC 50000 units

## 2011-09-08 ENCOUNTER — Other Ambulatory Visit: Payer: Self-pay | Admitting: Family Medicine

## 2011-09-08 MED ORDER — ERGOCALCIFEROL 1.25 MG (50000 UT) PO CAPS: 50000.0000 [IU] | ORAL_CAPSULE | ORAL | Status: DC

## 2011-11-02 ENCOUNTER — Ambulatory Visit (HOSPITAL_COMMUNITY)
Admission: RE | Admit: 2011-11-02 | Discharge: 2011-11-02 | Disposition: A | Discharge status: Home / Self Care | Payer: BC Managed Care – PPO | Source: Ambulatory Visit | Attending: Family Medicine | Admitting: Family Medicine

## 2011-11-02 ENCOUNTER — Encounter: Payer: Self-pay | Admitting: Family Medicine

## 2011-11-02 ENCOUNTER — Other Ambulatory Visit (HOSPITAL_COMMUNITY)
Admission: RE | Admit: 2011-11-02 | Discharge: 2011-11-02 | Disposition: A | Discharge status: Home / Self Care | Payer: BC Managed Care – PPO | Source: Ambulatory Visit | Attending: Family Medicine | Admitting: Family Medicine

## 2011-11-02 ENCOUNTER — Ambulatory Visit (INDEPENDENT_AMBULATORY_CARE_PROVIDER_SITE_OTHER): Payer: BC Managed Care – PPO | Admitting: Family Medicine

## 2011-11-02 ENCOUNTER — Other Ambulatory Visit: Payer: Self-pay | Admitting: Family Medicine

## 2011-11-02 VITALS — BP 138/90 | HR 100 | Resp 16 | Ht 65.0 in | Wt 242.4 lb

## 2011-11-02 DIAGNOSIS — Z01419 Encounter for gynecological examination (general) (routine) without abnormal findings: Secondary | ICD-10-CM | POA: Insufficient documentation

## 2011-11-02 DIAGNOSIS — Z124 Encounter for screening for malignant neoplasm of cervix: Secondary | ICD-10-CM

## 2011-11-02 DIAGNOSIS — M79661 Pain in right lower leg: Secondary | ICD-10-CM | POA: Insufficient documentation

## 2011-11-02 DIAGNOSIS — Z0000 Encounter for general adult medical examination without abnormal findings: Secondary | ICD-10-CM

## 2011-11-02 DIAGNOSIS — Z139 Encounter for screening, unspecified: Secondary | ICD-10-CM

## 2011-11-02 DIAGNOSIS — M545 Low back pain, unspecified: Secondary | ICD-10-CM

## 2011-11-02 DIAGNOSIS — E785 Hyperlipidemia, unspecified: Secondary | ICD-10-CM

## 2011-11-02 DIAGNOSIS — M79609 Pain in unspecified limb: Secondary | ICD-10-CM | POA: Insufficient documentation

## 2011-11-02 DIAGNOSIS — G4730 Sleep apnea, unspecified: Secondary | ICD-10-CM

## 2011-11-02 DIAGNOSIS — M79662 Pain in left lower leg: Secondary | ICD-10-CM

## 2011-11-02 DIAGNOSIS — Z1231 Encounter for screening mammogram for malignant neoplasm of breast: Secondary | ICD-10-CM | POA: Insufficient documentation

## 2011-11-02 DIAGNOSIS — M2550 Pain in unspecified joint: Secondary | ICD-10-CM

## 2011-11-02 DIAGNOSIS — Z1211 Encounter for screening for malignant neoplasm of colon: Secondary | ICD-10-CM

## 2011-11-02 DIAGNOSIS — N760 Acute vaginitis: Secondary | ICD-10-CM

## 2011-11-02 MED ORDER — IBUPROFEN 800 MG PO TABS: 800.0000 mg | ORAL_TABLET | Freq: Three times a day (TID) | ORAL | Status: AC | PRN

## 2011-11-02 MED ORDER — KETOROLAC TROMETHAMINE 60 MG/2ML IM SOLN
60.0000 mg | Freq: Once | INTRAMUSCULAR | Status: AC
  Administered 2011-11-02: 60 mg via INTRAMUSCULAR

## 2011-11-02 MED ORDER — METHYLPREDNISOLONE ACETATE 80 MG/ML IJ SUSP
80.0000 mg | Freq: Once | INTRAMUSCULAR | Status: AC
  Administered 2011-11-02: 80 mg via INTRAMUSCULAR

## 2011-11-02 MED ORDER — PREDNISONE (PAK) 5 MG PO TABS: 5.0000 mg | ORAL_TABLET | ORAL | Status: DC

## 2011-11-02 MED ORDER — TIZANIDINE HCL 4 MG PO TABS: 4.0000 mg | ORAL_TABLET | Freq: Three times a day (TID) | ORAL | Status: DC

## 2011-11-02 NOTE — Patient Instructions (Addendum)
F/u in 5 to 6 weeks.  Injections today for back pain, medication is sent in, and you are referred for an mRI  Venous doppler study of both calves to r/o clot.  You are referred to rheumatologist to evaluate progressive generalized muscle aches, joint pains, chronic fatigue andright upper extremity weakness.  You are referred for sleep study.  Mammogram past due, we will schedule

## 2011-11-02 NOTE — Progress Notes (Signed)
  Subjective:    Patient ID: Kristy Travis, female    DOB: [birth year], 46 y.o.   MRN: [id number]  HPI The PT is here for annual examand re-evaluation of chronic medical conditions, medication management and review of any available recent lab and radiology data.  Preventive health is updated, specifically  Cancer screening and Immunization.   Questions or concerns regarding consultations or procedures which the PT has had in the interim are  addressed. The PT denies any adverse reactions to current medications since the last visit.  C/o increased and uncontrolled back pain with generalized upper and lower extremity weakness C/o bilateral calf swelling and tendernessx 5 days C/o excessive daytime sleepiness wit chronic fatigue and excessive snoring       Review of Systems See HPI Denies recent fever or chills. Denies sinus pressure, nasal congestion, ear pain or sore throat. Denies chest congestion, productive cough or wheezing. Denies chest pains, palpitations and leg swelling Denies abdominal pain, nausea, vomiting,diarrhea or constipation.   Denies dysuria, frequency, hesitancy or incontinence. Denies headaches, seizures, numbness, or tingling. Denies depression, anxiety or insomnia. Denies skin break down or rash.        Objective:   Physical Exam Pleasant well nourished female, alert and oriented x 3, in no cardio-pulmonary distress. Afebrile.Pt in pain HEENT No facial trauma or asymetry. Sinuses non tender.  EOMI, PERTL, fundoscopic exam is normal, no hemorhage or exudate.  External ears normal, tympanic membranes clear. Oropharynx moist, no exudate, good dentition. Neck: supple, no adenopathy,JVD or thyromegaly.No bruits.  Chest: Clear to ascultation bilaterally.No crackles or wheezes. Non tender to palpation  Breast: No asymetry,no masses. No nipple discharge or inversion. No axillary or supraclavicular adenopathy  Cardiovascular system; Heart sounds  normal,  S1 and  S2 ,no S3.  No murmur, or thrill. Apical beat not displaced Peripheral pulses normal.  Abdomen: Soft, non tender, no organomegaly or masses. No bruits. Bowel sounds normal. No guarding, tenderness or rebound.  Rectal:  No mass. Guaiac negative stool.  GU: External genitalia normal. No lesions. Vaginal canal normal.No discharge. Uterus normal size, no adnexal masses, no cervical motion or adnexal tenderness.  Musculoskeletal exam: Significantly reduced  ROM of spine,adequate in  hips , shoulders and knees.Spasm of back muscles No deformity ,swelling or crepitus noted. No muscle wasting or atrophy. Calves swollwn, right greater than left, and tender  Neurologic: Cranial nerves 2 to 12 intact. Power, tone ,sensation and reflexes normal throughout. No disturbance in gait. No tremor.  Skin: Intact, no ulceration, erythema , scaling or rash noted. Pigmentation normal throughout  Psych; Normal mood and affect. Judgement and concentration normal        Assessment & Plan:

## 2011-11-03 LAB — GC/CHLAMYDIA PROBE AMP, GENITAL
Chlamydia, DNA Probe: NEGATIVE
GC Probe Amp, Genital: NEGATIVE

## 2011-11-03 LAB — WET PREP BY MOLECULAR PROBE
Candida species: NEGATIVE
Gardnerella vaginalis: POSITIVE — AB
Trichomonas vaginosis: NEGATIVE

## 2011-11-03 MED ORDER — METRONIDAZOLE 500 MG PO TABS: 500.0000 mg | ORAL_TABLET | Freq: Two times a day (BID) | ORAL | Status: AC

## 2011-11-04 ENCOUNTER — Other Ambulatory Visit: Payer: Self-pay

## 2011-11-04 MED ORDER — FLUCONAZOLE 150 MG PO TABS: 150.0000 mg | ORAL_TABLET | Freq: Once | ORAL | Status: AC

## 2011-11-11 ENCOUNTER — Telehealth: Payer: Self-pay | Admitting: Family Medicine

## 2011-11-11 NOTE — Telephone Encounter (Signed)
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

## 2011-11-11 NOTE — Telephone Encounter (Signed)
Patient is aware of results.

## 2011-11-11 NOTE — Telephone Encounter (Signed)
Patient agrees to referral to pain clinic for epidural injections

## 2011-11-16 ENCOUNTER — Telehealth: Payer: Self-pay | Admitting: Family Medicine

## 2011-11-16 ENCOUNTER — Other Ambulatory Visit: Payer: Self-pay | Admitting: Family Medicine

## 2011-11-16 DIAGNOSIS — M549 Dorsalgia, unspecified: Secondary | ICD-10-CM

## 2011-11-16 MED ORDER — GABAPENTIN 300 MG PO CAPS: ORAL_CAPSULE | ORAL | Status: DC

## 2011-11-16 NOTE — Telephone Encounter (Signed)
I sopke with pt, she will start gabapentin , is referred to pain clinic and for physical therapy. the value of weight loss was stressed

## 2011-11-16 NOTE — Telephone Encounter (Signed)
Can she have med for arthritis pain sent in?

## 2011-11-24 DIAGNOSIS — G4730 Sleep apnea, unspecified: Secondary | ICD-10-CM | POA: Insufficient documentation

## 2011-11-24 DIAGNOSIS — Z0000 Encounter for general adult medical examination without abnormal findings: Secondary | ICD-10-CM | POA: Insufficient documentation

## 2011-11-24 NOTE — Assessment & Plan Note (Signed)
recen prolonged immobility following foot surgery refer for venous doppler bilateral

## 2011-11-24 NOTE — Assessment & Plan Note (Signed)
Increased and uncontrolled, will order MRI spine may need pain management and epidural

## 2011-11-24 NOTE — Assessment & Plan Note (Signed)
Medical and  surgical history reviewed. Immunization and cancer screening updated. Diet and exercise, safety issues and adequate rest discussed

## 2011-11-24 NOTE — Assessment & Plan Note (Signed)
C/o increased and uncontrolled generalized pan and tenderness refer to rheumatology for eval

## 2011-11-24 NOTE — Assessment & Plan Note (Signed)
Hyperlipidemia:Low fat diet discussed and encouraged.   

## 2011-11-30 ENCOUNTER — Telehealth: Payer: Self-pay | Admitting: Family Medicine

## 2011-12-07 NOTE — Telephone Encounter (Signed)
Can you let me know exactly what she is chosing to defer, thanks

## 2011-12-08 ENCOUNTER — Ambulatory Visit: Payer: BC Managed Care – PPO | Admitting: Family Medicine

## 2011-12-08 ENCOUNTER — Encounter: Payer: Self-pay | Admitting: Family Medicine

## 2011-12-08 NOTE — Telephone Encounter (Signed)
thanks

## 2011-12-08 NOTE — Telephone Encounter (Signed)
Sleep study

## 2011-12-09 ENCOUNTER — Encounter: Payer: Self-pay | Admitting: Family Medicine

## 2012-04-04 ENCOUNTER — Encounter: Payer: Self-pay | Admitting: Family Medicine

## 2012-04-04 ENCOUNTER — Ambulatory Visit (INDEPENDENT_AMBULATORY_CARE_PROVIDER_SITE_OTHER): Payer: PRIVATE HEALTH INSURANCE | Admitting: Family Medicine

## 2012-04-04 VITALS — BP 146/92 | HR 90 | Resp 15 | Ht 65.0 in | Wt 234.8 lb

## 2012-04-04 DIAGNOSIS — J309 Allergic rhinitis, unspecified: Secondary | ICD-10-CM

## 2012-04-04 DIAGNOSIS — R5381 Other malaise: Secondary | ICD-10-CM

## 2012-04-04 DIAGNOSIS — E785 Hyperlipidemia, unspecified: Secondary | ICD-10-CM

## 2012-04-04 DIAGNOSIS — R5383 Other fatigue: Secondary | ICD-10-CM

## 2012-04-04 DIAGNOSIS — E669 Obesity, unspecified: Secondary | ICD-10-CM

## 2012-04-04 DIAGNOSIS — M129 Arthropathy, unspecified: Secondary | ICD-10-CM

## 2012-04-04 DIAGNOSIS — I10 Essential (primary) hypertension: Secondary | ICD-10-CM | POA: Insufficient documentation

## 2012-04-04 DIAGNOSIS — E559 Vitamin D deficiency, unspecified: Secondary | ICD-10-CM

## 2012-04-04 DIAGNOSIS — R7301 Impaired fasting glucose: Secondary | ICD-10-CM

## 2012-04-04 DIAGNOSIS — M2550 Pain in unspecified joint: Secondary | ICD-10-CM

## 2012-04-04 DIAGNOSIS — J302 Other seasonal allergic rhinitis: Secondary | ICD-10-CM

## 2012-04-04 HISTORY — DX: Other seasonal allergic rhinitis: J30.2

## 2012-04-04 MED ORDER — PREDNISONE (PAK) 5 MG PO TABS: 5.0000 mg | ORAL_TABLET | ORAL | Status: DC

## 2012-04-04 MED ORDER — DICLOFENAC SODIUM 75 MG PO TBEC: 75.0000 mg | DELAYED_RELEASE_TABLET | Freq: Two times a day (BID) | ORAL | Status: DC

## 2012-04-04 NOTE — Patient Instructions (Addendum)
F/u in end October or early November  Congrats on weight loss keep it up  New medication to be started for blood pressure.  Also please follow a diet rich in fruit and vegetable and low in sodium  Commit to walking 30 minute every day  Nasonex spray sent in for allergies and you can call in for zyrtec or claritin without a script. You need medication daily for allergies that will not affect blood pressure  Prednisone dose pack sent in for arthritis pain, start AFTER labs  I am requesting vit D, hepatic panel  also

## 2012-04-04 NOTE — Progress Notes (Signed)
  Subjective:    Patient ID: Kristy Travis, female    DOB: 04/10/1966, 46 y.o.   MRN: [id number]  HPI The PT is here for follow up and re-evaluation of chronic medical conditions, medication management and review of any available recent lab and radiology data.  Preventive health is updated, specifically  Cancer screening and Immunization.   Questions or concerns regarding consultations or procedures which the PT has had in the interim are  addressed. The PT denies any adverse reactions to current medications since the last visit.  C/o uncontrolled allergies, as well as generalized joint pains. Recently evaluated at her new job, blood pressure elevated, so she presents for treatment. Has done very well with weight loss and lifestyle change, encouraged, and will continue    Review of Systems See HPI Denies recent fever or chills. Denies sinus pressure, nasal congestion, ear pain or sore throat. Denies chest congestion, productive cough or wheezing. Denies chest pains, palpitations and leg swelling Denies abdominal pain, nausea, vomiting,diarrhea or constipation.   Denies dysuria, frequency, hesitancy or incontinence. C/o generalized joint pain, Denies headaches, seizures, numbness, or tingling. Denies depression, anxiety or insomnia. Denies skin break down or rash.        Objective:   Physical Exam Patient alert and oriented and in no cardiopulmonary distress.  HEENT: No facial asymmetry, EOMI, no sinus tenderness,  oropharynx pink and moist.  Neck supple no adenopathy.  Chest: Clear to auscultation bilaterally.  CVS: S1, S2 no murmurs, no S3.  ABD: Soft non tender. Bowel sounds normal.  Ext: No edema  MS: Adequate ROM spine, shoulders, hips and knees.  Skin: Intact, no ulcerations or rash noted.  Psych: Good eye contact, normal affect. Memory intact not anxious or depressed appearing.  CNS: CN 2-12 intact, power, tone and sensation normal throughout.          Assessment & Plan:

## 2012-04-09 NOTE — Assessment & Plan Note (Signed)
Uncontrolled arthritic pain steroid dose pack prescribed

## 2012-04-09 NOTE — Assessment & Plan Note (Signed)
Improved. Pt applauded on succesful weight loss through lifestyle change, and encouraged to continue same. Weight loss goal set for the next several months.  

## 2012-04-09 NOTE — Assessment & Plan Note (Signed)
Patient educated about the importance of limiting  Carbohydrate intake , the need to commit to daily physical activity for a minimum of 30 minutes , and to commit weight loss. The fact that changes in all these areas will reduce or eliminate all together the development of diabetes is stressed.   Update lab needed

## 2012-04-09 NOTE — Assessment & Plan Note (Signed)
Increased and uncontrolled symptoms, med prescribed 

## 2012-04-09 NOTE — Assessment & Plan Note (Signed)
Hyperlipidemia:Low fat diet discussed and encouraged.  Pt to start treatment

## 2012-04-09 NOTE — Assessment & Plan Note (Signed)
Uncontrolled, pt had been on meds in the past and noprmalized off med, will resume treatment DASH diet and commitment to daily physical activity for a minimum of 30 minutes discussed and encouraged, as a part of hypertension management. The importance of attaining a healthy weight is also discussed.

## 2012-04-12 ENCOUNTER — Telehealth: Payer: Self-pay | Admitting: Family Medicine

## 2012-04-12 DIAGNOSIS — I10 Essential (primary) hypertension: Secondary | ICD-10-CM

## 2012-04-12 MED ORDER — TRIAMTERENE-HCTZ 37.5-25 MG PO TABS: 1.0000 | ORAL_TABLET | Freq: Every day | ORAL | Status: DC

## 2012-04-12 NOTE — Telephone Encounter (Signed)
Sent in

## 2012-04-13 ENCOUNTER — Other Ambulatory Visit: Payer: Self-pay

## 2012-04-13 ENCOUNTER — Telehealth: Payer: Self-pay | Admitting: Family Medicine

## 2012-04-13 DIAGNOSIS — I10 Essential (primary) hypertension: Secondary | ICD-10-CM

## 2012-04-13 MED ORDER — TRIAMTERENE-HCTZ 37.5-25 MG PO TABS: 1.0000 | ORAL_TABLET | Freq: Every day | ORAL | Status: DC

## 2012-04-13 NOTE — Telephone Encounter (Signed)
Med sent on 10/2 and resent on 10/3.  Attempted to call patient but no ability to leave message.

## 2012-08-17 ENCOUNTER — Telehealth: Payer: Self-pay | Admitting: Family Medicine

## 2012-08-18 NOTE — Telephone Encounter (Signed)
Called patient and she is at work.  Left message that she could call back if she still has concern.

## 2012-08-21 ENCOUNTER — Ambulatory Visit (INDEPENDENT_AMBULATORY_CARE_PROVIDER_SITE_OTHER): Payer: 59 | Admitting: Family Medicine

## 2012-08-21 ENCOUNTER — Encounter: Payer: Self-pay | Admitting: Family Medicine

## 2012-08-21 VITALS — BP 148/90 | HR 96 | Resp 16 | Ht 65.0 in | Wt 238.0 lb

## 2012-08-21 DIAGNOSIS — E669 Obesity, unspecified: Secondary | ICD-10-CM

## 2012-08-21 DIAGNOSIS — R5381 Other malaise: Secondary | ICD-10-CM

## 2012-08-21 DIAGNOSIS — K3189 Other diseases of stomach and duodenum: Secondary | ICD-10-CM

## 2012-08-21 DIAGNOSIS — R7301 Impaired fasting glucose: Secondary | ICD-10-CM

## 2012-08-21 DIAGNOSIS — I10 Essential (primary) hypertension: Secondary | ICD-10-CM

## 2012-08-21 DIAGNOSIS — J309 Allergic rhinitis, unspecified: Secondary | ICD-10-CM

## 2012-08-21 DIAGNOSIS — R1013 Epigastric pain: Secondary | ICD-10-CM

## 2012-08-21 DIAGNOSIS — E559 Vitamin D deficiency, unspecified: Secondary | ICD-10-CM

## 2012-08-21 DIAGNOSIS — R519 Headache, unspecified: Secondary | ICD-10-CM | POA: Insufficient documentation

## 2012-08-21 DIAGNOSIS — R51 Headache: Secondary | ICD-10-CM

## 2012-08-21 DIAGNOSIS — E785 Hyperlipidemia, unspecified: Secondary | ICD-10-CM

## 2012-08-21 DIAGNOSIS — J302 Other seasonal allergic rhinitis: Secondary | ICD-10-CM

## 2012-08-21 MED ORDER — PANTOPRAZOLE SODIUM 40 MG PO TBEC: 40.0000 mg | DELAYED_RELEASE_TABLET | Freq: Every day | ORAL | Status: DC

## 2012-08-21 MED ORDER — FLUTICASONE PROPIONATE 50 MCG/ACT NA SUSP: 2.0000 | Freq: Every day | NASAL | Status: DC

## 2012-08-21 MED ORDER — AMLODIPINE BESYLATE 5 MG PO TABS: 5.0000 mg | ORAL_TABLET | Freq: Every day | ORAL | Status: DC

## 2012-08-21 NOTE — Progress Notes (Signed)
  Subjective:    Patient ID: Kristy Travis, female    DOB: Apr 03, 1966, 47 y.o.   MRN: [id number]  HPI The PT is here for follow up and re-evaluation of chronic medical conditions, medication management and review of any available recent lab and radiology data.  Preventive health is updated, specifically  Cancer screening and Immunization.   Questions or concerns regarding consultations or procedures which the PT has had in the interim are  addressed. States the maxzide has been making her nauseated, and she stopped taking some time ago. Recent bP check was high so she is aware she needs medication C/o frontal headache extending to crown of her head in the past 1 week, c/o stress and tension, job is demanding, working 42 to 75 hrs/week, c/o increased nasal drainage and clear post nasal drainage,no fever or chills C/o chronic nausea, belching and bloating      Review of Systems See HPI Denies recent fever or chills. Denies sinus pressure, nasal congestion, ear pain or sore throat.  Denies chest pains, palpitations and leg swelling .   Denies dysuria, frequency, hesitancy or incontinence. Denies uncontrolled  joint pain, swelling and limitation in mobility. Denies seizures, numbness, or tingling. Denies depression, does report stres, anxiety and poor sleep Denies skin break down or rash.        Objective:   Physical Exam Patient alert and oriented and in no cardiopulmonary distress.  HEENT: No facial asymmetry, EOMI, no sinus tenderness,  oropharynx pink and moist.  Neck supple no adenopathy.  Chest: Clear to auscultation bilaterally.  CVS: S1, S2 no murmurs, no S3.  ABD: Soft non tender. Bowel sounds normal.  Ext: No edema  MS: Adequate ROM spine, shoulders, hips and knees.  Skin: Intact, no ulcerations or rash noted.  Psych: Good eye contact, normal affect. Memory intact not anxious or depressed appearing.  CNS: CN 2-12 intact, power, tone and sensation normal  throughout.        Assessment & Plan:

## 2012-08-21 NOTE — Assessment & Plan Note (Signed)
Deteriorated. Patient re-educated about  the importance of commitment to a  minimum of 150 minutes of exercise per week. The importance of healthy food choices with portion control discussed. Encouraged to start a food diary, count calories and to consider  joining a support group. Sample diet sheets offered. Goals set by the patient for the next several months.    

## 2012-08-21 NOTE — Assessment & Plan Note (Signed)
Uncontrolled with headache, start flonase

## 2012-08-21 NOTE — Assessment & Plan Note (Signed)
New headache with no weakness , or numbness, increased allergy symptoms and stress, toradol and depo medrol in office

## 2012-08-21 NOTE — Assessment & Plan Note (Signed)
Increased symptoms , occasionally experiences dysphagia, 2 month trial of PPI, also educated re the need to reduce caffeine intake

## 2012-08-21 NOTE — Assessment & Plan Note (Signed)
Uncontrolled, new med started due to intolerance to maxzide. DASH diet and commitment to daily physical activity for a minimum of 30 minutes discussed and encouraged, as a part of hypertension management. The importance of attaining a healthy weight is also discussed.

## 2012-08-21 NOTE — Patient Instructions (Addendum)
cPE  April 28 or after  Fasting cbc, chem 7, lipid, hepatic, HBa1C, tSH, B12, h pylori and Vit D 2/14/or after  New medication for blood pressure which is high.  New med for allergy , flonase.  New med for reflux protonix for 2 month, also gall bladder tests. If persists call for referral to GI  oK to take benadryl one at bedtime in addition for allergies as well as to help with sleep also practic good sleep hygiene, we will give info on this   It is important that you exercise regularly at least 30 minutes 5 times a week. If you develop chest pain, have severe difficulty breathing, or feel very tired, stop exercising immediately and seek medical attention   A healthy diet is rich in fruit, vegetables and whole grains. Poultry fish, nuts and beans are a healthy choice for protein rather then red meat. A low sodium diet and drinking 64 ounces of water daily is generally recommended. Oils and sweet should be limited. Carbohydrates especially for those who are diabetic or overweight, should be limited to 34-45 gram per meal. It is important to eat on a regular schedule, at least 3 times daily. Snacks should be primarily fruits, vegetables or nuts.   Toradol 60mg  and depo medrol 80mg  im in office today for headache and allergies

## 2012-08-21 NOTE — Assessment & Plan Note (Signed)
Patient educated about the importance of limiting  Carbohydrate intake , the need to commit to daily physical activity for a minimum of 30 minutes , and to commit weight loss. The fact that changes in all these areas will reduce or eliminate all together the development of diabetes is stressed.   Updated lab needed 

## 2012-08-22 MED ORDER — METHYLPREDNISOLONE ACETATE 80 MG/ML IJ SUSP
80.0000 mg | Freq: Once | INTRAMUSCULAR | Status: AC
  Administered 2012-08-22: 80 mg via INTRAMUSCULAR

## 2012-08-22 MED ORDER — KETOROLAC TROMETHAMINE 60 MG/2ML IM SOLN
60.0000 mg | Freq: Once | INTRAMUSCULAR | Status: AC
  Administered 2012-08-22: 60 mg via INTRAMUSCULAR

## 2012-08-22 NOTE — Addendum Note (Signed)
Addended by: Abner Greenspan on: 08/22/2012 08:08 AM   Modules accepted: Orders

## 2012-08-28 ENCOUNTER — Ambulatory Visit (HOSPITAL_COMMUNITY): Payer: 59 | Attending: Family Medicine

## 2012-10-11 ENCOUNTER — Other Ambulatory Visit: Payer: Self-pay

## 2012-10-11 LAB — CBC WITH DIFFERENTIAL/PLATELET
Basophil #: 0 10*3/uL (ref 0.0–0.1)
Basophil %: 0.6 %
Eosinophil #: 0.1 10*3/uL (ref 0.0–0.7)
Eosinophil %: 1.3 %
HCT: 38.4 % (ref 35.0–47.0)
HGB: 12.3 g/dL (ref 12.0–16.0)
Lymphocyte #: 1.5 10*3/uL (ref 1.0–3.6)
Lymphocyte %: 19.9 %
MCH: 26.1 pg (ref 26.0–34.0)
MCHC: 32 g/dL (ref 32.0–36.0)
MCV: 82 fL (ref 80–100)
Monocyte #: 0.5 x10 3/mm (ref 0.2–0.9)
Monocyte %: 6.4 %
Neutrophil #: 5.2 10*3/uL (ref 1.4–6.5)
Neutrophil %: 71.8 %
Platelet: 286 10*3/uL (ref 150–440)
RBC: 4.72 10*6/uL (ref 3.80–5.20)
RDW: 14.8 % — ABNORMAL HIGH (ref 11.5–14.5)
WBC: 7.3 10*3/uL (ref 3.6–11.0)

## 2012-10-11 LAB — BASIC METABOLIC PANEL
Anion Gap: 3 — ABNORMAL LOW (ref 7–16)
BUN: 11 mg/dL (ref 7–18)
Calcium, Total: 9 mg/dL (ref 8.5–10.1)
Chloride: 105 mmol/L (ref 98–107)
Co2: 29 mmol/L (ref 21–32)
Creatinine: 0.52 mg/dL — ABNORMAL LOW (ref 0.60–1.30)
EGFR (African American): >60
EGFR (Non-African Amer.): >60
Glucose: 91 mg/dL (ref 65–99)
Osmolality: 273 (ref 275–301)
Potassium: 4.5 mmol/L (ref 3.5–5.1)
Sodium: 137 mmol/L (ref 136–145)

## 2012-10-11 LAB — HEPATIC FUNCTION PANEL A (ARMC)
Albumin: 3.8 g/dL (ref 3.4–5.0)
Alkaline Phosphatase: 83 U/L (ref 50–136)
Bilirubin, Direct: <0.1 mg/dL (ref 0.00–0.20)
Bilirubin,Total: 0.3 mg/dL (ref 0.2–1.0)
SGOT(AST): 22 U/L (ref 15–37)
SGPT (ALT): 25 U/L (ref 12–78)
Total Protein: 7.6 g/dL (ref 6.4–8.2)

## 2012-10-11 LAB — HEMOGLOBIN A1C: Hemoglobin A1C: 6 % (ref 4.2–6.3)

## 2012-10-25 ENCOUNTER — Ambulatory Visit: Payer: 59 | Admitting: Family Medicine

## 2012-10-25 ENCOUNTER — Encounter: Payer: Self-pay | Admitting: Family Medicine

## 2012-10-30 ENCOUNTER — Encounter: Payer: Self-pay | Admitting: Family Medicine

## 2012-11-09 ENCOUNTER — Ambulatory Visit: Payer: 59 | Admitting: Family Medicine

## 2012-11-15 ENCOUNTER — Ambulatory Visit (INDEPENDENT_AMBULATORY_CARE_PROVIDER_SITE_OTHER): Payer: 59 | Admitting: Family Medicine

## 2012-11-15 ENCOUNTER — Encounter: Payer: Self-pay | Admitting: Family Medicine

## 2012-11-15 VITALS — BP 142/90 | HR 80 | Resp 16 | Ht 65.0 in | Wt 244.1 lb

## 2012-11-15 DIAGNOSIS — K3189 Other diseases of stomach and duodenum: Secondary | ICD-10-CM

## 2012-11-15 DIAGNOSIS — J302 Other seasonal allergic rhinitis: Secondary | ICD-10-CM

## 2012-11-15 DIAGNOSIS — E559 Vitamin D deficiency, unspecified: Secondary | ICD-10-CM

## 2012-11-15 DIAGNOSIS — E669 Obesity, unspecified: Secondary | ICD-10-CM

## 2012-11-15 DIAGNOSIS — Z1211 Encounter for screening for malignant neoplasm of colon: Secondary | ICD-10-CM

## 2012-11-15 DIAGNOSIS — I10 Essential (primary) hypertension: Secondary | ICD-10-CM

## 2012-11-15 DIAGNOSIS — J309 Allergic rhinitis, unspecified: Secondary | ICD-10-CM

## 2012-11-15 DIAGNOSIS — M545 Low back pain, unspecified: Secondary | ICD-10-CM

## 2012-11-15 DIAGNOSIS — M2550 Pain in unspecified joint: Secondary | ICD-10-CM

## 2012-11-15 DIAGNOSIS — M25569 Pain in unspecified knee: Secondary | ICD-10-CM

## 2012-11-15 DIAGNOSIS — R1013 Epigastric pain: Secondary | ICD-10-CM

## 2012-11-15 MED ORDER — KETOROLAC TROMETHAMINE 60 MG/2ML IM SOLN
60.0000 mg | Freq: Once | INTRAMUSCULAR | Status: AC
  Administered 2012-11-15: 60 mg via INTRAMUSCULAR

## 2012-11-15 MED ORDER — METHYLPREDNISOLONE ACETATE 80 MG/ML IJ SUSP
80.0000 mg | Freq: Once | INTRAMUSCULAR | Status: AC
  Administered 2012-11-15: 80 mg via INTRAMUSCULAR

## 2012-11-15 MED ORDER — PHENTERMINE HCL 37.5 MG PO CAPS: 37.5000 mg | ORAL_CAPSULE | ORAL | Status: DC

## 2012-11-15 NOTE — Patient Instructions (Addendum)
CPE and pap in 3.5 month  Weight loss goal of 4 to 6 pounds per month  Start phentermine one daily.  No medication for blood pressure   It is important that you exercise regularly at least 5 times a week. If you develop chest pain, have severe difficulty breathing, or feel very tired, stop exercising immediately and seek medical attention   Commit to 1500 calorie diet, you will get a sheet   Toradol 60mg  IM and depo medrol 80mg   IM today for knee pain    Mammogram past due, please schedule  You are referred for a screening colonoscopy

## 2012-11-15 NOTE — Progress Notes (Signed)
  Subjective:    Patient ID: Kristy Travis, female    DOB: 1966/05/26, 47 y.o.   MRN: [id number]  HPI The PT is here for follow up and re-evaluation of chronic medical conditions, medication management and review of any available recent lab and radiology data.  Preventive health is updated, specifically  Cancer screening and Immunization.  Mammogram colonoscopy past due will have both done before return . The PT denies any adverse reactions to current medications since the last visit.  Vit D deficient on therapy  C/o uncontrolled generalized pains requests injections for this C/o weight gain wants to start phentermine and will commit to exercise, it has worked in the past      Review of Systems See HPI Denies recent fever or chills. Denies sinus pressure, nasal congestion, ear pain or sore throat. Denies chest congestion, productive cough or wheezing. Denies chest pains, palpitations and leg swelling Denies abdominal pain, nausea, vomiting,diarrhea or constipation.   Denies dysuria, frequency, hesitancy or incontinence.  Denies headaches, seizures, numbness, or tingling. Denies depression, anxiety or insomnia. Denies skin break down or rash.        Objective:   Physical Exam  Patient alert and oriented and in no cardiopulmonary distress.  HEENT: No facial asymmetry, EOMI, no sinus tenderness,  oropharynx pink and moist.  Neck supple no adenopathy.  Chest: Clear to auscultation bilaterally.  CVS: S1, S2 no murmurs, no S3.  ABD: Soft non tender. Bowel sounds normal.  Ext: No edema  MS: Adequate though reduced  ROM spine,normal in  shoulders, hips and knees.  Skin: Intact, no ulcerations or rash noted.  Psych: Good eye contact, normal affect. Memory intact not anxious or depressed appearing.  CNS: CN 2-12 intact, power, tone and sensation normal throughout.       Assessment & Plan:

## 2012-11-19 NOTE — Assessment & Plan Note (Signed)
Controlled, no change in medication  

## 2012-11-19 NOTE — Assessment & Plan Note (Signed)
Uncontrolled, depo medrol and toradol at visit

## 2012-11-19 NOTE — Assessment & Plan Note (Signed)
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

## 2012-11-19 NOTE — Assessment & Plan Note (Signed)
Deteriorated. Patient re-educated about  the importance of commitment to a  minimum of 150 minutes of exercise per week. The importance of healthy food choices with portion control discussed. Encouraged to start a food diary, count calories and to consider  joining a support group. Sample diet sheets offered. Goals set by the patient for the next several months.    

## 2012-11-19 NOTE — Assessment & Plan Note (Signed)
Controlled, no change in medication DASH diet and commitment to daily physical activity for a minimum of 30 minutes discussed and encouraged, as a part of hypertension management. The importance of attaining a healthy weight is also discussed.  

## 2012-11-19 NOTE — Assessment & Plan Note (Signed)
Improved on medication continue same 

## 2012-11-29 ENCOUNTER — Encounter: Payer: Self-pay | Admitting: Family Medicine

## 2012-12-20 ENCOUNTER — Encounter: Payer: Self-pay | Admitting: Family Medicine

## 2012-12-20 ENCOUNTER — Ambulatory Visit (INDEPENDENT_AMBULATORY_CARE_PROVIDER_SITE_OTHER): Payer: 59 | Admitting: Family Medicine

## 2012-12-20 ENCOUNTER — Other Ambulatory Visit (HOSPITAL_COMMUNITY)
Admission: RE | Admit: 2012-12-20 | Discharge: 2012-12-20 | Disposition: A | Discharge status: Home / Self Care | Payer: 59 | Source: Ambulatory Visit | Attending: Family Medicine | Admitting: Family Medicine

## 2012-12-20 VITALS — BP 122/76 | HR 98 | Resp 18 | Ht 65.0 in | Wt 248.0 lb

## 2012-12-20 DIAGNOSIS — Z0000 Encounter for general adult medical examination without abnormal findings: Secondary | ICD-10-CM

## 2012-12-20 DIAGNOSIS — Z1239 Encounter for other screening for malignant neoplasm of breast: Secondary | ICD-10-CM

## 2012-12-20 DIAGNOSIS — Z113 Encounter for screening for infections with a predominantly sexual mode of transmission: Secondary | ICD-10-CM

## 2012-12-20 DIAGNOSIS — R0609 Other forms of dyspnea: Secondary | ICD-10-CM

## 2012-12-20 DIAGNOSIS — R0600 Dyspnea, unspecified: Secondary | ICD-10-CM

## 2012-12-20 DIAGNOSIS — Z1212 Encounter for screening for malignant neoplasm of rectum: Secondary | ICD-10-CM

## 2012-12-20 DIAGNOSIS — E785 Hyperlipidemia, unspecified: Secondary | ICD-10-CM

## 2012-12-20 DIAGNOSIS — Z01419 Encounter for gynecological examination (general) (routine) without abnormal findings: Secondary | ICD-10-CM | POA: Insufficient documentation

## 2012-12-20 DIAGNOSIS — R002 Palpitations: Secondary | ICD-10-CM

## 2012-12-20 DIAGNOSIS — G4710 Hypersomnia, unspecified: Secondary | ICD-10-CM | POA: Insufficient documentation

## 2012-12-20 DIAGNOSIS — G4730 Sleep apnea, unspecified: Secondary | ICD-10-CM

## 2012-12-20 DIAGNOSIS — Z1211 Encounter for screening for malignant neoplasm of colon: Secondary | ICD-10-CM

## 2012-12-20 DIAGNOSIS — R9431 Abnormal electrocardiogram [ECG] [EKG]: Secondary | ICD-10-CM

## 2012-12-20 DIAGNOSIS — R0989 Other specified symptoms and signs involving the circulatory and respiratory systems: Secondary | ICD-10-CM

## 2012-12-20 DIAGNOSIS — Z1151 Encounter for screening for human papillomavirus (HPV): Secondary | ICD-10-CM | POA: Insufficient documentation

## 2012-12-20 LAB — POC HEMOCCULT BLD/STL (OFFICE/1-CARD/DIAGNOSTIC): Fecal Occult Blood, POC: NEGATIVE

## 2012-12-20 NOTE — Patient Instructions (Addendum)
F/u in 4 month, call if you need me before  STOP phentermine, due to new palpitations and shortness of breath   You will have an EKG today, shows some occasional extra beats, , and are being referred to cardiology in [facility] for further evaluation   You are referred for re testing for sleep apnea, you need to treat this   Commit to lifestyle change for improved health

## 2012-12-20 NOTE — Progress Notes (Signed)
  Subjective:    Patient ID: Kristy Travis, female    DOB: [birth year], 47 y.o.   MRN: [id number]  HPI The PT is here for annual exam  and re-evaluation of chronic medical conditions, medication management and review of any available recent lab and radiology data.  Preventive health is updated, specifically  Cancer screening and Immunization.   Still has not followed through on recommendation for evaluation and treatment of sleep apnea. Now c/o new onset palpitations and increased fatigue in the past 3 to 4 month Eating habits remain unchanged, and weight is unchanged      Review of Systems See HPI ,Denies recent fever or chills. c/o worsening fatigue. Denies sinus pressure, nasal congestion, ear pain or sore throat. Denies chest congestion, productive cough or wheezing. Denies chest pains,pND , orthopnea   and leg swelling Denies abdominal pain, nausea, vomiting,diarrhea or constipation.   Denies dysuria, frequency, hesitancy or incontinence. Chronic back and lower extremity pain unchanged Denies headaches, seizures, numbness, or tingling. Denies depression, anxiety or insomnia. Denies skin break down or rash.         Objective:   Physical Exam  Pleasant well nourished female, alert and oriented x 3, in no cardio-pulmonary distress. Afebrile. HEENT No facial trauma or asymetry. Sinuses non tender.  EOMI, PERTL, fundoscopic exam is normal, no hemorhage or exudate.  External ears normal, tympanic membranes clear. Oropharynx moist, no exudate, good dentition. Neck: supple, no adenopathy,JVD or thyromegaly.No bruits.  Chest: Clear to ascultation bilaterally.No crackles or wheezes. Non tender to palpation  Breast: No asymetry,no masses. No nipple discharge or inversion. No axillary or supraclavicular adenopathy  Cardiovascular system; Heart sounds normal,  S1 and  S2 ,no S3.  No murmur, or thrill. Apical beat not displaced Peripheral pulses  normal.  Abdomen: Soft, non tender, no organomegaly or masses. No bruits. Bowel sounds normal. No guarding, tenderness or rebound.  Rectal:  No mass. Guaiac negative stool.  GU: External genitalia normal. No lesions. Vaginal canal normal.No discharge. Uterus normal size, no adnexal masses, no cervical motion or adnexal tenderness.  Musculoskeletal exam: Decreased though adequate  ROM of spine, hips , shoulders and knees. No deformity ,swelling or crepitus noted. No muscle wasting or atrophy.   Neurologic: Cranial nerves 2 to 12 intact. Power, tone ,sensation and reflexes normal throughout. No disturbance in gait. No tremor.  Skin: Intact, no ulceration, erythema , scaling or rash noted. Pigmentation normal throughout  Psych; Normal mood and affect. Judgement and concentration normal       Assessment & Plan:

## 2012-12-21 ENCOUNTER — Encounter: Payer: Self-pay | Admitting: Family Medicine

## 2012-12-29 ENCOUNTER — Telehealth: Payer: Self-pay | Admitting: Family Medicine

## 2012-12-29 NOTE — Telephone Encounter (Signed)
Noted  

## 2012-12-30 DIAGNOSIS — Z0000 Encounter for general adult medical examination without abnormal findings: Secondary | ICD-10-CM | POA: Insufficient documentation

## 2012-12-30 NOTE — Assessment & Plan Note (Deleted)
updtaed

## 2012-12-30 NOTE — Assessment & Plan Note (Signed)
Importance of fully evaluating this with sleep study and eval by a specialist is explaned in detail, including possibility of heart and lung failure apart from many other problems

## 2012-12-30 NOTE — Assessment & Plan Note (Signed)
New onset palpitations and increased exertional fatigue with abnormal ekg, pt to discontinue phentermine and have cardioloy eval

## 2012-12-30 NOTE — Assessment & Plan Note (Signed)
pelvic , breast and rectal exam as documented. Pt c/o new onset palpitations and increased fatigue. She is advised to discontinue phentermine, and EKG at visit shows mild irregularity in heartbeat, she is referred to cardiology for further eval

## 2013-03-21 ENCOUNTER — Ambulatory Visit: Payer: 59 | Admitting: Family Medicine

## 2013-03-26 ENCOUNTER — Telehealth: Payer: Self-pay | Admitting: Family Medicine

## 2013-03-26 NOTE — Telephone Encounter (Signed)
Pt was already referred and did not keep appt pls let her kniow this , she can call the office and see if she can reschedule

## 2013-03-27 ENCOUNTER — Telehealth: Payer: Self-pay | Admitting: Family Medicine

## 2013-03-27 NOTE — Telephone Encounter (Signed)
Yes had already been referred

## 2013-03-27 NOTE — Telephone Encounter (Signed)
noted 

## 2013-03-28 NOTE — Telephone Encounter (Signed)
Patient has an appointment with Dr. Juliann Pares at the [facility] in [place] 9.22.2014 be there at 3:15 patient is aware of this

## 2013-04-30 ENCOUNTER — Ambulatory Visit (INDEPENDENT_AMBULATORY_CARE_PROVIDER_SITE_OTHER): Payer: 59 | Admitting: Family Medicine

## 2013-04-30 ENCOUNTER — Encounter: Payer: Self-pay | Admitting: Family Medicine

## 2013-04-30 VITALS — BP 158/88 | HR 94 | Resp 18 | Ht 65.0 in | Wt 256.1 lb

## 2013-04-30 DIAGNOSIS — R5381 Other malaise: Secondary | ICD-10-CM

## 2013-04-30 DIAGNOSIS — E8881 Metabolic syndrome: Secondary | ICD-10-CM

## 2013-04-30 DIAGNOSIS — R9431 Abnormal electrocardiogram [ECG] [EKG]: Secondary | ICD-10-CM

## 2013-04-30 DIAGNOSIS — E559 Vitamin D deficiency, unspecified: Secondary | ICD-10-CM

## 2013-04-30 DIAGNOSIS — E669 Obesity, unspecified: Secondary | ICD-10-CM

## 2013-04-30 DIAGNOSIS — G4710 Hypersomnia, unspecified: Secondary | ICD-10-CM

## 2013-04-30 DIAGNOSIS — I10 Essential (primary) hypertension: Secondary | ICD-10-CM

## 2013-04-30 DIAGNOSIS — E785 Hyperlipidemia, unspecified: Secondary | ICD-10-CM

## 2013-04-30 MED ORDER — LOSARTAN POTASSIUM-HCTZ 50-12.5 MG PO TABS: 1.0000 | ORAL_TABLET | Freq: Every day | ORAL | Status: DC

## 2013-04-30 NOTE — Progress Notes (Signed)
  Subjective:    Patient ID: Kristy Travis, female    DOB: March 30, 1966, 47 y.o.   MRN: [id number]  HPI The PT is here for follow up and re-evaluation of chronic medical conditions, medication management and review of any available recent lab and radiology data.  Preventive health is updated, specifically  Cancer screening and Immunization.   She has decided on surgical intervention for her obesity , which i fully support, and is looking at several different surgeons in the area and beyond..She has had cardiology evaluation.  The PT denies any adverse reactions to current medications since the last visit.  There are no new concerns.  There are no specific complaints , her chronic fatigue is worsening, has to "push herself"      Review of Systems See HPI Denies recent fever or chills. Denies sinus pressure, nasal congestion, ear pain or sore throat. Denies chest congestion, productive cough or wheezing. Denies chest pains, palpitations and leg swelling Denies abdominal pain, nausea, vomiting,diarrhea or constipation.   Denies dysuria, frequency, hesitancy or incontinence. Denies joint pain, swelling and limitation in mobility. Denies headaches, seizures, numbness, or tingling. Denies depression, anxiety or insomnia. Denies skin break down or rash.        Objective:   Physical Exam  Patient alert and oriented and in no cardiopulmonary distress.  HEENT: No facial asymmetry, EOMI, no sinus tenderness,  oropharynx pink and moist.  Neck supple no adenopathy.  Chest: Clear to auscultation bilaterally.  CVS: S1, S2 no murmurs, no S3.  ABD: Soft non tender. Bowel sounds normal.  Ext: No edema  MS: Adequate ROM spine, shoulders, hips and knees.  Skin: Intact, no ulcerations or rash noted.  Psych: Good eye contact, normal affect. Memory intact not anxious or depressed appearing.  CNS: CN 2-12 intact, power, tone and sensation normal throughout.       Assessment &  Plan:

## 2013-04-30 NOTE — Patient Instructions (Signed)
F/u in 6 weeks, call if you need me before  Blood pressure is high, start hyzaar one daily  Fasting lipid, chem 7, HBA1C and Vit D 1 week before visit, will request it be faxed to office  Commit to regular exercise for improved health  We will give you contact info for gastric bypass surgeons in [facility]

## 2013-05-03 ENCOUNTER — Telehealth: Payer: Self-pay | Admitting: *Deleted

## 2013-05-03 NOTE — Telephone Encounter (Signed)
Pt states she needs her vitamin D medicine called in. [phone]

## 2013-05-03 NOTE — Telephone Encounter (Signed)
Patient is to take otc vit d

## 2013-06-11 ENCOUNTER — Ambulatory Visit: Payer: Self-pay | Admitting: Specialist

## 2013-06-11 ENCOUNTER — Ambulatory Visit: Payer: 59 | Admitting: Family Medicine

## 2013-06-20 ENCOUNTER — Encounter: Payer: Self-pay | Admitting: Family Medicine

## 2013-06-20 ENCOUNTER — Ambulatory Visit (INDEPENDENT_AMBULATORY_CARE_PROVIDER_SITE_OTHER): Payer: 59 | Admitting: Family Medicine

## 2013-06-20 ENCOUNTER — Encounter (INDEPENDENT_AMBULATORY_CARE_PROVIDER_SITE_OTHER): Payer: Self-pay

## 2013-06-20 VITALS — BP 150/82 | HR 98 | Temp 98.8°F | Resp 18 | Ht 65.0 in | Wt 256.0 lb

## 2013-06-20 DIAGNOSIS — G4710 Hypersomnia, unspecified: Secondary | ICD-10-CM

## 2013-06-20 DIAGNOSIS — E669 Obesity, unspecified: Secondary | ICD-10-CM

## 2013-06-20 DIAGNOSIS — I10 Essential (primary) hypertension: Secondary | ICD-10-CM

## 2013-06-20 DIAGNOSIS — J0100 Acute maxillary sinusitis, unspecified: Secondary | ICD-10-CM

## 2013-06-20 DIAGNOSIS — J320 Chronic maxillary sinusitis: Secondary | ICD-10-CM | POA: Insufficient documentation

## 2013-06-20 MED ORDER — LOSARTAN POTASSIUM-HCTZ 100-12.5 MG PO TABS: 1.0000 | ORAL_TABLET | Freq: Every day | ORAL | Status: DC

## 2013-06-20 MED ORDER — FLUCONAZOLE 150 MG PO TABS: ORAL_TABLET | ORAL | Status: AC

## 2013-06-20 MED ORDER — AZITHROMYCIN 250 MG PO TABS: ORAL_TABLET | ORAL | Status: AC

## 2013-06-20 NOTE — Patient Instructions (Signed)
F/u in early February, please call if you need me before  Z pack and fluconazole are sent in for sinus  Symptoms   Dose increase in BP medication stop current medication once you get this

## 2013-06-20 NOTE — Progress Notes (Signed)
   Subjective:    Patient ID: Kristy Travis, female    DOB: August 05, 1965, 47 y.o.   MRN: [id number]  HPI The PT is here for follow up and re-evaluation of chronic medical conditions, medication management and review of any available recent lab and radiology data.  Preventive health is updated, specifically  Cancer screening and Immunization.   Questions or concerns regarding consultations or procedures which the PT has had in the interim are  Addressed.Has appt set for surgery for obesity in the Spring of 2015, will need to have sleep study as a result, still continues to have severe chronic fatigue The PT denies any adverse reactions to current medications since the last visit.  3 day h/o ear pressure and sinus pressure with increased foul tasting post nasal drainage. Denies cough or documented fever, has ahd intermittent chills     Review of Systems See HPI  Denies chest pains, palpitations and leg swelling Denies abdominal pain, nausea, vomiting,diarrhea or constipation.   Denies dysuria, frequency, hesitancy or incontinence. Denies joint pain, swelling and limitation in mobility. Denies headaches, seizures, numbness, or tingling. Denies depression, anxiety or insomnia. Denies skin break down or rash.        Objective:   Physical Exam  Patient alert and oriented and in no cardiopulmonary distress.  HEENT: No facial asymmetry, EOMI, maxillary sinus tenderness,  oropharynx pink and moist.  Neck supple no adenopathy.TM clear bilaterally  Chest: Clear to auscultation bilaterally.  CVS: S1, S2 no murmurs, no S3.  ABD: Soft non tender. Bowel sounds normal.  Ext: No edema  MS: Adequate ROM spine, shoulders, hips and knees.  Skin: Intact, no ulcerations or rash noted.  Psych: Good eye contact, normal affect. Memory intact not anxious or depressed appearing.  CNS: CN 2-12 intact, power, tone and sensation normal throughout.       Assessment & Plan:

## 2013-06-24 NOTE — Assessment & Plan Note (Signed)
Unchanged, plans on bypass in March 2015, has  Met with surgeon Patient re-educated about  the importance of commitment to a  minimum of 150 minutes of exercise per week. The importance of healthy food choices with portion control discussed. Encouraged to start a food diary, count calories and to consider  joining a support group. Sample diet sheets offered. Goals set by the patient for the next several months.

## 2013-06-24 NOTE — Assessment & Plan Note (Signed)
Uncontrolled, dose increase in hyzaar DASH diet and commitment to daily physical activity for a minimum of 30 minutes discussed and encouraged, as a part of hypertension management. The importance of attaining a healthy weight is also discussed.

## 2013-06-24 NOTE — Assessment & Plan Note (Signed)
z apck prescribed

## 2013-06-24 NOTE — Assessment & Plan Note (Signed)
Will finally follow through with sleep study as she has no choice now thatshe is commited to gastric bypass

## 2013-07-08 ENCOUNTER — Telehealth: Payer: Self-pay | Admitting: Family Medicine

## 2013-07-08 NOTE — Assessment & Plan Note (Signed)
Deteriorated. Patient re-educated about  the importance of commitment to a  minimum of 150 minutes of exercise per week. The importance of healthy food choices with portion control discussed. Encouraged to start a food diary, count calories and to consider  joining a support group. Sample diet sheets offered. Goals set by the patient for the next several months.    

## 2013-07-08 NOTE — Assessment & Plan Note (Signed)
Chronic fatigue worsening, pt has consistently refuced to have rept sleep study , however , will likely be required if she goes through with surgical intervention for obesity

## 2013-07-08 NOTE — Assessment & Plan Note (Signed)
Uncontrolled, dose increase in medication, daily compliance stressed. DASH diet and commitment to daily physical activity for a minimum of 30 minutes discussed and encouraged, as a part of hypertension management. The importance of attaining a healthy weight is also discussed.

## 2013-07-08 NOTE — Assessment & Plan Note (Signed)
Continue daily OTC supplement 

## 2013-07-08 NOTE — Telephone Encounter (Signed)
Pls contact pt and see ifd she has had cardiology eval, referred since 12/2012. I elieve that hse has had this done in [place] , where she works. If she has pls send for the note, if she has not  Please let me know so I can correct my note as well as remind her of the need to follow through with this. I DO NEED to know if she has been to cardiology , sompls send me back a tele msg

## 2013-07-08 NOTE — Assessment & Plan Note (Signed)
Pt at increased CV risk and she is aware of the need to consistently work on lifestyle changes to resolve this

## 2013-07-08 NOTE — Assessment & Plan Note (Signed)
Updated lab needed Hyperlipidemia:Low fat diet discussed and encouraged.   

## 2013-07-09 NOTE — Telephone Encounter (Signed)
Patient was seen by Dr Waylan Boga at [facility]

## 2013-07-12 ENCOUNTER — Ambulatory Visit: Payer: Self-pay | Admitting: Specialist

## 2013-08-12 ENCOUNTER — Ambulatory Visit: Payer: Self-pay | Admitting: Specialist

## 2013-08-16 ENCOUNTER — Ambulatory Visit: Payer: Self-pay | Admitting: Specialist

## 2013-08-16 LAB — COMPREHENSIVE METABOLIC PANEL
Albumin: 3.7 g/dL (ref 3.4–5.0)
Alkaline Phosphatase: 82 U/L
Anion Gap: 6 — ABNORMAL LOW (ref 7–16)
BUN: 10 mg/dL (ref 7–18)
Bilirubin,Total: 0.3 mg/dL (ref 0.2–1.0)
Calcium, Total: 9.1 mg/dL (ref 8.5–10.1)
Chloride: 106 mmol/L (ref 98–107)
Co2: 29 mmol/L (ref 21–32)
Creatinine: 0.57 mg/dL — ABNORMAL LOW (ref 0.60–1.30)
EGFR (African American): >60
EGFR (Non-African Amer.): >60
Glucose: 91 mg/dL (ref 65–99)
Osmolality: 280 (ref 275–301)
Potassium: 4 mmol/L (ref 3.5–5.1)
SGOT(AST): 18 U/L (ref 15–37)
SGPT (ALT): 29 U/L (ref 12–78)
Sodium: 141 mmol/L (ref 136–145)
Total Protein: 7.3 g/dL (ref 6.4–8.2)

## 2013-08-16 LAB — CBC WITH DIFFERENTIAL/PLATELET
Basophil #: 0 10*3/uL (ref 0.0–0.1)
Basophil %: 0.7 %
Eosinophil #: 0.1 10*3/uL (ref 0.0–0.7)
Eosinophil %: 1.2 %
HCT: 37.2 % (ref 35.0–47.0)
HGB: 12.2 g/dL (ref 12.0–16.0)
Lymphocyte #: 1.4 10*3/uL (ref 1.0–3.6)
Lymphocyte %: 23.7 %
MCH: 26.1 pg (ref 26.0–34.0)
MCHC: 32.7 g/dL (ref 32.0–36.0)
MCV: 80 fL (ref 80–100)
Monocyte #: 0.4 x10 3/mm (ref 0.2–0.9)
Monocyte %: 6 %
Neutrophil #: 4.2 10*3/uL (ref 1.4–6.5)
Neutrophil %: 68.4 %
Platelet: 270 10*3/uL (ref 150–440)
RBC: 4.65 10*6/uL (ref 3.80–5.20)
RDW: 15.3 % — ABNORMAL HIGH (ref 11.5–14.5)
WBC: 6.1 10*3/uL (ref 3.6–11.0)

## 2013-08-16 LAB — FERRITIN: Ferritin (ARMC): 18 ng/mL (ref 8–388)

## 2013-08-16 LAB — IRON AND TIBC
Iron Bind.Cap.(Total): 390 ug/dL (ref 250–450)
Iron Saturation: 24 %
Iron: 95 ug/dL (ref 50–170)
Unbound Iron-Bind.Cap.: 295 ug/dL

## 2013-08-16 LAB — MAGNESIUM: Magnesium: 2 mg/dL

## 2013-08-16 LAB — PROTIME-INR
INR: 1
Prothrombin Time: 12.7 secs (ref 11.5–14.7)

## 2013-08-16 LAB — BILIRUBIN, DIRECT: Bilirubin, Direct: <0.1 mg/dL (ref 0.00–0.20)

## 2013-08-16 LAB — APTT: Activated PTT: 31.3 secs (ref 23.6–35.9)

## 2013-08-16 LAB — PHOSPHORUS: Phosphorus: 3 mg/dL (ref 2.5–4.9)

## 2013-08-16 LAB — FOLATE: Folic Acid: 17.4 ng/mL (ref 3.1–100.0)

## 2013-08-16 LAB — HEMOGLOBIN A1C: Hemoglobin A1C: 6.2 % (ref 4.2–6.3)

## 2013-08-16 LAB — TSH: Thyroid Stimulating Horm: 1.69 u[IU]/mL

## 2013-08-16 LAB — AMYLASE: Amylase: 38 U/L (ref 25–115)

## 2013-08-16 LAB — LIPASE, BLOOD: Lipase: 99 U/L (ref 73–393)

## 2013-08-31 ENCOUNTER — Ambulatory Visit: Payer: Self-pay | Admitting: Specialist

## 2013-09-04 ENCOUNTER — Ambulatory Visit: Payer: Self-pay | Admitting: Gastroenterology

## 2013-09-06 ENCOUNTER — Ambulatory Visit: Payer: 59 | Admitting: Family Medicine

## 2013-09-09 ENCOUNTER — Ambulatory Visit: Payer: Self-pay | Admitting: Specialist

## 2013-09-10 LAB — PATHOLOGY REPORT

## 2013-09-17 HISTORY — PX: BARIATRIC SURGERY: SHX1103

## 2013-09-27 ENCOUNTER — Ambulatory Visit: Payer: Self-pay | Admitting: Specialist

## 2013-10-02 ENCOUNTER — Inpatient Hospital Stay: Payer: Self-pay | Admitting: Specialist

## 2013-10-03 LAB — CBC WITH DIFFERENTIAL/PLATELET
Basophil #: 0 10*3/uL (ref 0.0–0.1)
Basophil %: 0.1 %
Eosinophil #: 0 10*3/uL (ref 0.0–0.7)
Eosinophil %: 0 %
HCT: 36.2 % (ref 35.0–47.0)
HGB: 11.6 g/dL — ABNORMAL LOW (ref 12.0–16.0)
Lymphocyte #: 1.1 10*3/uL (ref 1.0–3.6)
Lymphocyte %: 9.2 %
MCH: 25.8 pg — ABNORMAL LOW (ref 26.0–34.0)
MCHC: 32.1 g/dL (ref 32.0–36.0)
MCV: 80 fL (ref 80–100)
Monocyte #: 0.5 x10 3/mm (ref 0.2–0.9)
Monocyte %: 4.6 %
Neutrophil #: 10 10*3/uL — ABNORMAL HIGH (ref 1.4–6.5)
Neutrophil %: 86.1 %
Platelet: 281 10*3/uL (ref 150–440)
RBC: 4.5 10*6/uL (ref 3.80–5.20)
RDW: 15.4 % — ABNORMAL HIGH (ref 11.5–14.5)
WBC: 11.7 10*3/uL — ABNORMAL HIGH (ref 3.6–11.0)

## 2013-10-03 LAB — BASIC METABOLIC PANEL
Anion Gap: 4 — ABNORMAL LOW (ref 7–16)
BUN: 6 mg/dL — ABNORMAL LOW (ref 7–18)
Calcium, Total: 8.6 mg/dL (ref 8.5–10.1)
Chloride: 107 mmol/L (ref 98–107)
Co2: 26 mmol/L (ref 21–32)
Creatinine: 0.66 mg/dL (ref 0.60–1.30)
EGFR (African American): >60
EGFR (Non-African Amer.): >60
Glucose: 108 mg/dL — ABNORMAL HIGH (ref 65–99)
Osmolality: 272 (ref 275–301)
Potassium: 4.3 mmol/L (ref 3.5–5.1)
Sodium: 137 mmol/L (ref 136–145)

## 2013-10-03 LAB — PHOSPHORUS: Phosphorus: 2.9 mg/dL (ref 2.5–4.9)

## 2013-10-03 LAB — ALBUMIN: Albumin: 3.3 g/dL — ABNORMAL LOW (ref 3.4–5.0)

## 2013-10-03 LAB — MAGNESIUM: Magnesium: 1.8 mg/dL

## 2013-10-04 LAB — PATHOLOGY REPORT

## 2013-10-26 ENCOUNTER — Ambulatory Visit: Payer: Self-pay | Admitting: Bariatrics

## 2013-10-30 ENCOUNTER — Ambulatory Visit (INDEPENDENT_AMBULATORY_CARE_PROVIDER_SITE_OTHER): Payer: 59 | Admitting: Family Medicine

## 2013-10-30 ENCOUNTER — Encounter: Payer: Self-pay | Admitting: Family Medicine

## 2013-10-30 ENCOUNTER — Encounter (INDEPENDENT_AMBULATORY_CARE_PROVIDER_SITE_OTHER): Payer: Self-pay

## 2013-10-30 VITALS — BP 142/92 | HR 84 | Resp 18 | Ht 65.0 in | Wt 230.0 lb

## 2013-10-30 DIAGNOSIS — R5381 Other malaise: Secondary | ICD-10-CM

## 2013-10-30 DIAGNOSIS — R5382 Chronic fatigue, unspecified: Secondary | ICD-10-CM | POA: Insufficient documentation

## 2013-10-30 DIAGNOSIS — G9332 Myalgic encephalomyelitis/chronic fatigue syndrome: Secondary | ICD-10-CM

## 2013-10-30 DIAGNOSIS — E8881 Metabolic syndrome: Secondary | ICD-10-CM

## 2013-10-30 DIAGNOSIS — E669 Obesity, unspecified: Secondary | ICD-10-CM

## 2013-10-30 DIAGNOSIS — I10 Essential (primary) hypertension: Secondary | ICD-10-CM

## 2013-10-30 DIAGNOSIS — E559 Vitamin D deficiency, unspecified: Secondary | ICD-10-CM

## 2013-10-30 MED ORDER — AMPHETAMINE-DEXTROAMPHET ER 10 MG PO CP24: 10.0000 mg | ORAL_CAPSULE | Freq: Every day | ORAL | Status: DC

## 2013-10-30 MED ORDER — ERGOCALCIFEROL 1.25 MG (50000 UT) PO CAPS: 50000.0000 [IU] | ORAL_CAPSULE | ORAL | Status: DC

## 2013-10-30 NOTE — Patient Instructions (Signed)
F/u in  3 month, call if you need me before  Blood pressure elevated today, continue to work on weight loss and low sodium diet  New for low vit D is once weekly capsule  New for fatigue  is aderal once daily, you will need to collect script once monthly, this should help with chronic fatigue  Congratys on weight loss, keep it uP!

## 2013-11-09 ENCOUNTER — Ambulatory Visit: Payer: Self-pay | Admitting: Bariatrics

## 2013-11-23 ENCOUNTER — Encounter: Payer: Self-pay | Admitting: Family Medicine

## 2013-11-23 NOTE — Assessment & Plan Note (Signed)
Uncontrolled, was taken off medication by surgeon. Since she is losing weight and comited to lifestyle change, will hold on addition of BP med at this time DASH diet and commitment to daily physical activity for a minimum of 30 minutes discussed and encouraged, as a part of hypertension management. The importance of attaining a healthy weight is also discussed. If elevated at next visit will need to resume medication , which I think will be the case

## 2013-11-23 NOTE — Assessment & Plan Note (Signed)
Improved. Pt applauded on succesful weight loss through lifestyle change, and encouraged to continue same. Weight loss goal set for the next several months.  

## 2013-11-23 NOTE — Assessment & Plan Note (Signed)
Start weekly vit D 

## 2013-11-23 NOTE — Assessment & Plan Note (Signed)
The increased risk of cardiovascular disease associated with this diagnosis, and the need to consistently work on lifestyle to change this is discussed. Following  a  heart healthy diet ,commitment to 30 minutes of exercise at least 5 days per week, as well as control of blood sugar and cholesterol , and achieving a healthy weight are all the areas to be addressed . Improving with weight loss

## 2013-11-23 NOTE — Assessment & Plan Note (Signed)
Recent sleep study prior to bariatric surgery reportedly normal Pt has chronic fatigue, review of labs show no anemia. Trial of aderall for her symptom, contract signed for resricted drugs

## 2013-11-23 NOTE — Progress Notes (Signed)
   Subjective:    Patient ID: Kristy Travis, female    DOB: [birth year], 48 y.o.   MRN: [id number]  HPI The PT is here for follow up and re-evaluation of chronic medical conditions, medication management and review of any available recent lab and radiology data.  Preventive health is updated, specifically  Cancer screening and Immunization.   She had bariatric surgery since last visit, no complications and doing well. Has lost 26 pounds since last visit. States she was told her iron and vit D levels were low, will f/u in labs and prescribe as needed. Continues to c/o fatigue, had sleep study prior to her surgery which she reports as normal.Depression screen has been consistently negative, med trial I believe is appropriate    Review of Systems See HPI Denies recent fever or chills. C/o fatigueDenies sinus pressure, nasal congestion, ear pain or sore throat. Denies chest congestion, productive cough or wheezing. Denies chest pains, palpitations and leg swelling Denies abdominal pain, nausea, vomiting,diarrhea or constipation.   Denies dysuria, frequency, hesitancy or incontinence. Denies uncontrolled joint pain, swelling and limitation in mobility. Denies headaches, seizures, numbness, or tingling. Denies depression, anxiety or insomnia. Denies skin break down or rash.        Objective:   Physical Exam BP 142/92  Pulse 84  Resp 18  Ht 5\' 5"  (1.651 m)  Wt 230 lb 0.6 oz (104.345 kg)  BMI 38.28 kg/m2  SpO2 99% Patient alert and oriented and in no cardiopulmonary distress.  HEENT: No facial asymmetry, EOMI, no sinus tenderness,  oropharynx pink and moist.  Neck supple no adenopathy.  Chest: Clear to auscultation bilaterally.  CVS: S1, S2 no murmurs, no S3.  ABD: Soft non tender. Bowel sounds normal.  Ext: No edema  MS: Adequate ROM spine, shoulders, hips and knees.  Skin: Intact, no ulcerations or rash noted.  Psych: Good eye contact, normal affect. Memory intact  not anxious or depressed appearing.  CNS: CN 2-12 intact, power, tone and sensation normal throughout.        Assessment & Plan:  HTN (hypertension) Uncontrolled, was taken off medication by surgeon. Since she is losing weight and comited to lifestyle change, will hold on addition of BP med at this time DASH diet and commitment to daily physical activity for a minimum of 30 minutes discussed and encouraged, as a part of hypertension management. The importance of attaining a healthy weight is also discussed. If elevated at next visit will need to resume medication , which I think will be the case   Metabolic syndrome X The increased risk of cardiovascular disease associated with this diagnosis, and the need to consistently work on lifestyle to change this is discussed. Following  a  heart healthy diet ,commitment to 30 minutes of exercise at least 5 days per week, as well as control of blood sugar and cholesterol , and achieving a healthy weight are all the areas to be addressed . Improving with weight loss   Chronic fatigue and malaise Recent sleep study prior to bariatric surgery reportedly normal Pt has chronic fatigue, review of labs show no anemia. Trial of aderall for her symptom, contract signed for resricted drugs  Vitamin D deficiency Start weekly vit D  OBESITY Improved. Pt applauded on succesful weight loss through lifestyle change, and encouraged to continue same. Weight loss goal set for the next several months.

## 2013-11-26 ENCOUNTER — Other Ambulatory Visit: Payer: Self-pay

## 2013-11-26 MED ORDER — AMPHETAMINE-DEXTROAMPHET ER 10 MG PO CP24: 10.0000 mg | ORAL_CAPSULE | Freq: Every day | ORAL | Status: DC

## 2014-06-11 HISTORY — PX: TOTAL VAGINAL HYSTERECTOMY: SHX2548

## 2014-06-24 ENCOUNTER — Ambulatory Visit: Payer: Self-pay | Admitting: Obstetrics and Gynecology

## 2014-06-24 LAB — BASIC METABOLIC PANEL
Anion Gap: 7 (ref 7–16)
BUN: 12 mg/dL (ref 7–18)
Calcium, Total: 9.3 mg/dL (ref 8.5–10.1)
Chloride: 105 mmol/L (ref 98–107)
Co2: 29 mmol/L (ref 21–32)
Creatinine: 0.73 mg/dL (ref 0.60–1.30)
EGFR (African American): >60
EGFR (Non-African Amer.): >60
Glucose: 92 mg/dL (ref 65–99)
Osmolality: 281 (ref 275–301)
Potassium: 3.9 mmol/L (ref 3.5–5.1)
Sodium: 141 mmol/L (ref 136–145)

## 2014-06-24 LAB — HEMOGLOBIN: HGB: 12.3 g/dL (ref 12.0–16.0)

## 2014-06-25 LAB — CREATININE, SERUM
Creatinine: 0.6 mg/dL (ref 0.60–1.30)
EGFR (African American): >60
EGFR (Non-African Amer.): >60

## 2014-06-25 LAB — HEMOGLOBIN: HGB: 10.1 g/dL — ABNORMAL LOW (ref 12.0–16.0)

## 2014-06-26 ENCOUNTER — Encounter: Payer: Self-pay | Admitting: Family Medicine

## 2014-06-26 ENCOUNTER — Encounter: Payer: 59 | Admitting: Family Medicine

## 2014-07-12 DIAGNOSIS — K859 Acute pancreatitis without necrosis or infection, unspecified: Secondary | ICD-10-CM

## 2014-07-12 HISTORY — DX: Acute pancreatitis without necrosis or infection, unspecified: K85.90

## 2014-07-12 HISTORY — PX: CHOLECYSTECTOMY: SHX55

## 2014-08-01 ENCOUNTER — Ambulatory Visit: Payer: 59 | Admitting: Family Medicine

## 2014-08-01 LAB — URINALYSIS, COMPLETE
Bacteria: NONE SEEN
Bilirubin,UR: NEGATIVE
Blood: NEGATIVE
Glucose,UR: NEGATIVE mg/dL (ref 0–75)
Leukocyte Esterase: NEGATIVE
Nitrite: NEGATIVE
Ph: 5 (ref 4.5–8.0)
Protein: NEGATIVE
RBC,UR: 1 /HPF (ref 0–5)
Specific Gravity: 1.02 (ref 1.003–1.030)
Squamous Epithelial: 1
WBC UR: 1 /HPF (ref 0–5)

## 2014-08-01 LAB — COMPREHENSIVE METABOLIC PANEL
Albumin: 3.9 g/dL (ref 3.4–5.0)
Alkaline Phosphatase: 124 U/L — ABNORMAL HIGH
Anion Gap: 6 — ABNORMAL LOW (ref 7–16)
BUN: 11 mg/dL (ref 7–18)
Bilirubin,Total: 0.5 mg/dL (ref 0.2–1.0)
Calcium, Total: 9.5 mg/dL (ref 8.5–10.1)
Chloride: 104 mmol/L (ref 98–107)
Co2: 30 mmol/L (ref 21–32)
Creatinine: 0.74 mg/dL (ref 0.60–1.30)
EGFR (African American): >60
EGFR (Non-African Amer.): >60
Glucose: 103 mg/dL — ABNORMAL HIGH (ref 65–99)
Osmolality: 279 (ref 275–301)
Potassium: 4 mmol/L (ref 3.5–5.1)
SGOT(AST): 241 U/L — ABNORMAL HIGH (ref 15–37)
SGPT (ALT): 276 U/L — ABNORMAL HIGH
Sodium: 140 mmol/L (ref 136–145)
Total Protein: 7.6 g/dL (ref 6.4–8.2)

## 2014-08-01 LAB — CBC
HCT: 40.7 % (ref 35.0–47.0)
HGB: 13.2 g/dL (ref 12.0–16.0)
MCH: 26.9 pg (ref 26.0–34.0)
MCHC: 32.4 g/dL (ref 32.0–36.0)
MCV: 83 fL (ref 80–100)
Platelet: 275 10*3/uL (ref 150–440)
RBC: 4.91 10*6/uL (ref 3.80–5.20)
RDW: 14.1 % (ref 11.5–14.5)
WBC: 6.9 10*3/uL (ref 3.6–11.0)

## 2014-08-01 LAB — LIPASE, BLOOD: Lipase: 8446 U/L — ABNORMAL HIGH (ref 73–393)

## 2014-08-02 ENCOUNTER — Inpatient Hospital Stay: Payer: Self-pay | Admitting: Internal Medicine

## 2014-08-03 LAB — LIPID PANEL
Cholesterol: 190 mg/dL (ref 0–200)
HDL Cholesterol: 51 mg/dL (ref 40–60)
Ldl Cholesterol, Calc: 120 mg/dL — ABNORMAL HIGH (ref 0–100)
Triglycerides: 94 mg/dL (ref 0–200)
VLDL Cholesterol, Calc: 19 mg/dL (ref 5–40)

## 2014-08-03 LAB — COMPREHENSIVE METABOLIC PANEL
Albumin: 2.9 g/dL — ABNORMAL LOW (ref 3.4–5.0)
Alkaline Phosphatase: 85 U/L
Anion Gap: 9 (ref 7–16)
BUN: 7 mg/dL (ref 7–18)
Bilirubin,Total: 0.5 mg/dL (ref 0.2–1.0)
Calcium, Total: 8.3 mg/dL — ABNORMAL LOW (ref 8.5–10.1)
Chloride: 108 mmol/L — ABNORMAL HIGH (ref 98–107)
Co2: 23 mmol/L (ref 21–32)
Creatinine: 0.56 mg/dL — ABNORMAL LOW (ref 0.60–1.30)
EGFR (African American): >60
EGFR (Non-African Amer.): >60
Glucose: 71 mg/dL (ref 65–99)
Osmolality: 276 (ref 275–301)
Potassium: 4.2 mmol/L (ref 3.5–5.1)
SGOT(AST): 62 U/L — ABNORMAL HIGH (ref 15–37)
SGPT (ALT): 125 U/L — ABNORMAL HIGH
Sodium: 140 mmol/L (ref 136–145)
Total Protein: 5.8 g/dL — ABNORMAL LOW (ref 6.4–8.2)

## 2014-08-03 LAB — CBC WITH DIFFERENTIAL/PLATELET
Basophil #: 0 10*3/uL (ref 0.0–0.1)
Basophil %: 0.2 %
Eosinophil #: 0.1 10*3/uL (ref 0.0–0.7)
Eosinophil %: 1.6 %
HCT: 33.7 % — ABNORMAL LOW (ref 35.0–47.0)
HGB: 10.5 g/dL — ABNORMAL LOW (ref 12.0–16.0)
Lymphocyte #: 1.2 10*3/uL (ref 1.0–3.6)
Lymphocyte %: 19.2 %
MCH: 25.9 pg — ABNORMAL LOW (ref 26.0–34.0)
MCHC: 31.3 g/dL — ABNORMAL LOW (ref 32.0–36.0)
MCV: 83 fL (ref 80–100)
Monocyte #: 0.4 x10 3/mm (ref 0.2–0.9)
Monocyte %: 7.2 %
Neutrophil #: 4.4 10*3/uL (ref 1.4–6.5)
Neutrophil %: 71.8 %
Platelet: 197 10*3/uL (ref 150–440)
RBC: 4.07 10*6/uL (ref 3.80–5.20)
RDW: 14.2 % (ref 11.5–14.5)
WBC: 6.2 10*3/uL (ref 3.6–11.0)

## 2014-08-03 LAB — AMYLASE: Amylase: 182 U/L — ABNORMAL HIGH (ref 25–115)

## 2014-08-03 LAB — LIPASE, BLOOD: Lipase: 593 U/L — ABNORMAL HIGH (ref 73–393)

## 2014-08-04 LAB — CBC WITH DIFFERENTIAL/PLATELET
Basophil #: 0 10*3/uL (ref 0.0–0.1)
Basophil %: 0.5 %
Eosinophil #: 0.2 10*3/uL (ref 0.0–0.7)
Eosinophil %: 3 %
HCT: 32.7 % — ABNORMAL LOW (ref 35.0–47.0)
HGB: 10.4 g/dL — ABNORMAL LOW (ref 12.0–16.0)
Lymphocyte #: 1.6 10*3/uL (ref 1.0–3.6)
Lymphocyte %: 25.6 %
MCH: 26.3 pg (ref 26.0–34.0)
MCHC: 32 g/dL (ref 32.0–36.0)
MCV: 82 fL (ref 80–100)
Monocyte #: 0.5 x10 3/mm (ref 0.2–0.9)
Monocyte %: 7.7 %
Neutrophil #: 4 10*3/uL (ref 1.4–6.5)
Neutrophil %: 63.2 %
Platelet: 205 10*3/uL (ref 150–440)
RBC: 3.98 10*6/uL (ref 3.80–5.20)
RDW: 14.1 % (ref 11.5–14.5)
WBC: 6.3 10*3/uL (ref 3.6–11.0)

## 2014-08-04 LAB — COMPREHENSIVE METABOLIC PANEL
Albumin: 2.9 g/dL — ABNORMAL LOW (ref 3.4–5.0)
Alkaline Phosphatase: 83 U/L
Anion Gap: 8 (ref 7–16)
BUN: 5 mg/dL — ABNORMAL LOW (ref 7–18)
Bilirubin,Total: 0.3 mg/dL (ref 0.2–1.0)
Calcium, Total: 8.9 mg/dL (ref 8.5–10.1)
Chloride: 108 mmol/L — ABNORMAL HIGH (ref 98–107)
Co2: 26 mmol/L (ref 21–32)
Creatinine: 0.55 mg/dL — ABNORMAL LOW (ref 0.60–1.30)
EGFR (African American): >60
EGFR (Non-African Amer.): >60
Glucose: 83 mg/dL (ref 65–99)
Osmolality: 280 (ref 275–301)
Potassium: 3.9 mmol/L (ref 3.5–5.1)
SGOT(AST): 30 U/L (ref 15–37)
SGPT (ALT): 91 U/L — ABNORMAL HIGH
Sodium: 142 mmol/L (ref 136–145)
Total Protein: 6 g/dL — ABNORMAL LOW (ref 6.4–8.2)

## 2014-08-04 LAB — AMYLASE: Amylase: 67 U/L (ref 25–115)

## 2014-08-04 LAB — LIPASE, BLOOD: Lipase: 196 U/L (ref 73–393)

## 2014-08-05 LAB — CBC WITH DIFFERENTIAL/PLATELET
Basophil #: 0 10*3/uL (ref 0.0–0.1)
Basophil %: 0.5 %
Eosinophil #: 0.2 10*3/uL (ref 0.0–0.7)
Eosinophil %: 3.5 %
HCT: 31.7 % — ABNORMAL LOW (ref 35.0–47.0)
HGB: 10.1 g/dL — ABNORMAL LOW (ref 12.0–16.0)
Lymphocyte #: 1.6 10*3/uL (ref 1.0–3.6)
Lymphocyte %: 32.1 %
MCH: 26.2 pg (ref 26.0–34.0)
MCHC: 31.7 g/dL — ABNORMAL LOW (ref 32.0–36.0)
MCV: 83 fL (ref 80–100)
Monocyte #: 0.4 x10 3/mm (ref 0.2–0.9)
Monocyte %: 7.7 %
Neutrophil #: 2.9 10*3/uL (ref 1.4–6.5)
Neutrophil %: 56.2 %
Platelet: 201 10*3/uL (ref 150–440)
RBC: 3.84 10*6/uL (ref 3.80–5.20)
RDW: 14.1 % (ref 11.5–14.5)
WBC: 5.1 10*3/uL (ref 3.6–11.0)

## 2014-08-05 LAB — COMPREHENSIVE METABOLIC PANEL
Albumin: 2.9 g/dL — ABNORMAL LOW (ref 3.4–5.0)
Alkaline Phosphatase: 83 U/L
Anion Gap: 6 — ABNORMAL LOW (ref 7–16)
BUN: 6 mg/dL — ABNORMAL LOW (ref 7–18)
Bilirubin,Total: 0.3 mg/dL (ref 0.2–1.0)
Calcium, Total: 8.8 mg/dL (ref 8.5–10.1)
Chloride: 109 mmol/L — ABNORMAL HIGH (ref 98–107)
Co2: 28 mmol/L (ref 21–32)
Creatinine: 0.53 mg/dL — ABNORMAL LOW (ref 0.60–1.30)
EGFR (African American): >60
EGFR (Non-African Amer.): >60
Glucose: 90 mg/dL (ref 65–99)
Osmolality: 282 (ref 275–301)
Potassium: 4 mmol/L (ref 3.5–5.1)
SGOT(AST): 32 U/L (ref 15–37)
SGPT (ALT): 70 U/L — ABNORMAL HIGH
Sodium: 143 mmol/L (ref 136–145)
Total Protein: 5.9 g/dL — ABNORMAL LOW (ref 6.4–8.2)

## 2014-08-05 LAB — AMYLASE: Amylase: 44 U/L (ref 25–115)

## 2014-08-06 LAB — COMPREHENSIVE METABOLIC PANEL
Albumin: 3.2 g/dL — ABNORMAL LOW (ref 3.4–5.0)
Alkaline Phosphatase: 90 U/L (ref 46–116)
Anion Gap: 10 (ref 7–16)
BUN: 6 mg/dL — ABNORMAL LOW (ref 7–18)
Bilirubin,Total: 0.2 mg/dL (ref 0.2–1.0)
Calcium, Total: 9.5 mg/dL (ref 8.5–10.1)
Chloride: 105 mmol/L (ref 98–107)
Co2: 27 mmol/L (ref 21–32)
Creatinine: 0.52 mg/dL — ABNORMAL LOW (ref 0.60–1.30)
EGFR (African American): >60
EGFR (Non-African Amer.): >60
Glucose: 82 mg/dL (ref 65–99)
Osmolality: 280 (ref 275–301)
Potassium: 4.1 mmol/L (ref 3.5–5.1)
SGOT(AST): 40 U/L — ABNORMAL HIGH (ref 15–37)
SGPT (ALT): 68 U/L — ABNORMAL HIGH (ref 14–63)
Sodium: 142 mmol/L (ref 136–145)
Total Protein: 6.8 g/dL (ref 6.4–8.2)

## 2014-08-06 LAB — CBC WITH DIFFERENTIAL/PLATELET
Basophil #: 0 10*3/uL (ref 0.0–0.1)
Basophil %: 0.1 %
Eosinophil #: 0 10*3/uL (ref 0.0–0.7)
Eosinophil %: 0 %
HCT: 34.9 % — ABNORMAL LOW (ref 35.0–47.0)
HGB: 11.2 g/dL — ABNORMAL LOW (ref 12.0–16.0)
Lymphocyte #: 1.1 10*3/uL (ref 1.0–3.6)
Lymphocyte %: 12.7 %
MCH: 26 pg (ref 26.0–34.0)
MCHC: 32.1 g/dL (ref 32.0–36.0)
MCV: 81 fL (ref 80–100)
Monocyte #: 0.4 x10 3/mm (ref 0.2–0.9)
Monocyte %: 5.2 %
Neutrophil #: 6.9 10*3/uL — ABNORMAL HIGH (ref 1.4–6.5)
Neutrophil %: 82 %
Platelet: 246 10*3/uL (ref 150–440)
RBC: 4.31 10*6/uL (ref 3.80–5.20)
RDW: 14.1 % (ref 11.5–14.5)
WBC: 8.4 10*3/uL (ref 3.6–11.0)

## 2014-08-06 LAB — LIPASE, BLOOD: Lipase: 64 U/L — ABNORMAL LOW (ref 73–393)

## 2014-10-01 ENCOUNTER — Other Ambulatory Visit: Payer: Self-pay | Admitting: Family Medicine

## 2014-11-02 NOTE — Op Note (Signed)
PATIENT NAME:  Kristy MountsWILLIAMSON, Kristy Travis MR#:  [id number] DATE OF BIRTH:  01-19-1966  DATE OF PROCEDURE:  10/02/2013  PREOPERATIVE DIAGNOSIS: Morbid obesity, gallstones.  POSTOPERATIVE DIAGNOSIS: Morbid obesity, gallstones.  PROCEDURE: Laparoscopic sleeve gastrectomy.   SURGEON: Primus BravoJon Tyliek Timberman, MD  ASSISTANT:  Lewis MoccasinHarry Kurtz, PA.   ANESTHESIA:  General endotracheal.  INDICATION:  See H and P.   FINDINGS:  No significant hiatal hernia.  COMPLICATIONS: None.  ESTIMATED BLOOD LOSS: None.  DETAILS OF PROCEDURE:  The patient was taken to the operating room and placed on the operating room table, in the supine position, with appropriate monitors and supplemental oxygen being delivered.  Broad spectrum IV antibiotics were administered. The patient was placed under general anesthesia without incident.  The abdomen was prepped and draped in the usual sterile fashion.  Access was obtained using 5 mm Optical trocar. Pneumoperitoneum was established without difficulty. Multiple other ports were placed in preparation for sleeve gastrectomy. A liver retractor was placed without incident. The entire stomach was mobilized from 5 cm from the pylorus all the way up to the fundus, and the fundus was mobilized off the left crura as well completely freeing up the posterior portion of the stomach. Posterior attachments were taken down so the crura could be visualized from both sides.  At that point, everything was removed from the stomach and a 934 [place] Bougie was placed down into the antrum. An Echelon green load stapler was used to bisect the antrum on first fire starting approximately 5 to 6 cm from the pylorus. I then continued up along the Bougie using a blue load stapler with excellent affect with care not to get too close to the Bougie itself, with minimal traction. This continued all the way up to the left crura. The excess stomach was placed on the side and the Bougie was removed and endoscopy showed no evidence of  obstruction at that time.  The excess stomach was removed through the abdominal cavity, and the wounds were closed using 4-0 Vicryl and Dermabond.    ____________________________ Primus BravoJon Izetta Sakamoto, MD jb:dmm D: 10/02/2013 12:08:00 ET T: 10/02/2013 12:43:40 ET JOB#: [id number]  cc: Primus BravoJon Krissi Willaims, MD, <Dictator> Cletis AthensJON Marjean DonnaM Loriene Taunton MD ELECTRONICALLY SIGNED 10/03/2013 14:43

## 2014-11-04 LAB — SURGICAL PATHOLOGY

## 2014-11-06 NOTE — Op Note (Signed)
PATIENT NAME:  Kristy Travis, Kristy Travis MR#:  [id number] DATE OF BIRTH:  [birth year]  DATE OF PROCEDURE:  06/24/2014  PREOPERATIVE DIAGNOSES:  1.  Abnormal uterine bleeding.  2.  Fibroid uterus.   POSTOPERATIVE DIAGNOSES:  1.  Abnormal uterine bleeding.  2.  Fibroid uterus.   OPERATION: Total vaginal hysterectomy with bilateral salpingo-oophorectomy and cystoscopy.   ANESTHESIA: General.   SURGEON: Kristy Bari S. Valentino Saxon, MD   ASSISTANT: Prentice Docker. DeFrancesco, MD  ESTIMATED BLOOD LOSS: 300 mL.  OPERATIVE FLUIDS: 1500 mL.   URINE OUTPUT: 300 mL.   COMPLICATIONS: None.   FINDINGS: Uterus approximately 9 cm on bimanual exam. There was normal-appearing uterine outline, tubes and ovaries. There was efflux at both ureteric orifices during cystoscopy.  Normal-appearing bladder dome.   SPECIMEN: Uterus, bilateral fallopian tubes, and ovaries.   CONDITION: Stable.   PROCEDURE: The patient was taken to the operating room where she was placed under general anesthesia without difficulty. She was then prepped and draped in normal sterile fashion after she was placed dorsal lithotomy position using candy cane stirrups. Foley catheter was placed and allowed to drain to gravity. After the bladder was drained, the Foley catheter was clamped off using a Kelly clamp. Next, a weighted speculum was placed into the vagina and the cervix was grasped with a double-tooth tenaculum. The cervix was then circumferentially injected with Pitressin, approximately 20 mL in 30 mL of normal saline. The cervix was then circumferentially incised with the scalpel. The posterior cul-de-sac was then entered sharply using the Mayo scissors without difficulty. Heaney clamp was then placed over the uterosacral ligaments on either side. These were then transected and suture ligated with 0 Vicryl. Hemostasis was assured. The cardinal ligaments were then clamped on both sides, transected and suture ligated in similar fashion. Next, the  anterior cul-de-sac was then entered sharply with the Mayo scissors and the bladder was dissected off the pubovesical cervical fascia.  Next, the uterine arteries and the broad ligaments were then serially clamped with Heaney clamps, transected, and suture ligated on both sides. Excellent hemostasis was visualized. Both cornua were clamped with Heaney clamps, transected, and the uterus was delivered. These pedicles were then suture ligated with excellent hemostasis. Next, the left ovary and fallopian tube were then grasped using a Babcock clamp and a Heaney clamp was placed on the infundibulopelvic ligament which was then transected and suture ligated. The same procedure was performed on the right fallopian tube and ovary. These specimens were then also passed off. There was some bleeding noted at the left IP pedicle, so a right angle clamp was used to grasp the bleeding vessel and a second one was used to crossclamp, Then this area was suture ligated with good hemostasis noted.   Next, the peritoneum was then transfixed to the posterior vaginal cuff using a running suture of 0 Vicryl. After this, the vaginal cuff angle on the left was closed.  The vaginal cuff angles were closed with figure-of-eight stitches of 0 Vicryl on both sides and transfixed to the ipsilateral uterosacral ligaments. The remainder of the vaginal cuff was closed using a running suture of 0 Vicryl. All instruments were then removed from the vagina. The clamp was then removed from the Foley catheter and the bladder was then again allowed to drain. After this, the catheter was removed from the patient's bladder, and a cystoscopy was performed due to concern about the left ureter as the left IP was noted to have bleeding and was clamped off close  to the pelvic sidewall; however, both ureteric orifices were noted to have good efflux of urine after the  methylene blue had been injected IV approximately 2.5 mL. The cystoscope was then removed from  the patient's bladder, after the bladder was allowed to drain. The patient was then taken out of dorsal lithotomy position and awakened from general anesthesia. She was taken to the PACU in stable condition. Sponge, lap, needle, and instrument counts were correct x 2 at the end of the procedure. The patient received 2 grams of Ancef prior to start of the procedure.    ____________________________ Jacques EarthlyAnika S. Valentino Saxonherry, MD asc:LT D: 06/24/2014 16:34:50 ET T: 06/24/2014 19:42:59 ET JOB#: [id number]  cc: Jacques EarthlyAnika S. Valentino Saxonherry, MD, <Dictator> Fabian NovemberANIKA S Jovonte Commins MD ELECTRONICALLY SIGNED 07/12/2014 9:59

## 2014-11-10 NOTE — Consult Note (Signed)
Brief Consult Note: Diagnosis: biliary pancreatitis, possible cystic duct obstruction.   Patient was seen by consultant.   Consult note dictated.   Recommend further assessment or treatment.   Comments: Please see full Gi consult [phone].    Patient admitted with abd pain, n/v.  findings showing elevated lipase,  with possible cystic duct obstruction, without evidence of CBD obstruction.  Continue NPO, ivf hydration, pain control.  Awaiting surgical consult, will order HIDA scan.  Low threshold for antibiotics if fever or incerased pain.  Electronic Signatures: Barnetta ChapelSkulskie, Martin (MD)  (Signed 22-Jan-16 16:33)  Authored: Brief Consult Note   Last Updated: 22-Jan-16 16:33 by Barnetta ChapelSkulskie, Martin (MD)

## 2014-11-10 NOTE — Consult Note (Signed)
Chief Complaint:  Subjective/Chief Complaint seen for biliaty pancreatitis.   feeling better today, 3/10  pain.  minimal nausea, passing flatus, no emesis.   VITAL SIGNS/ANCILLARY NOTES: **Vital Signs.:   23-Jan-16 07:38  Vital Signs Type Routine  Temperature Temperature (F) 98.7  Celsius 37  Temperature Source oral  Pulse Pulse 81  Respirations Respirations 18  Systolic BP Systolic BP 95  Diastolic BP (mmHg) Diastolic BP (mmHg) 63  Mean BP 73  Pulse Ox % Pulse Ox % 100  Pulse Ox Activity Level  At rest  Oxygen Delivery Room Air/ 21 %   Brief Assessment:  Cardiac Regular   Respiratory clear BS   Gastrointestinal details normal Soft  Nondistended  Bowel sounds normal  No rebound tenderness  tender to palpation epigastrum a nd somewhat to right .   Lab Results: Hepatic:  23-Jan-16 04:18   Bilirubin, Total 0.5  Alkaline Phosphatase 85 (46-116 NOTE: New Reference Range 01/29/14)  SGPT (ALT)  125 (14-63 NOTE: New Reference Range 01/29/14)  SGOT (AST)  62  Total Protein, Serum  5.8  Albumin, Serum  2.9  Routine Chem:  21-Jan-16 22:04   Lipase  8446 (Result(s) reported on 01 Aug 2014 at 10:37PM.)  23-Jan-16 04:18   Amylase, Serum  182 (Result(s) reported on 03 Aug 2014 at 05:05AM.)  Lipase  593 (Result(s) reported on 03 Aug 2014 at 04:59AM.)  Glucose, Serum 71  BUN 7  Creatinine (comp)  0.56  Sodium, Serum 140  Potassium, Serum 4.2  Chloride, Serum  108  CO2, Serum 23  Calcium (Total), Serum  8.3  Osmolality (calc) 276  eGFR (African American) >60  eGFR (Non-African American) >60 (eGFR values <60mL/min/1.73 m2 may be an indication of chronic kidney disease (CKD). Calculated eGFR, using the MRDR Study equation, is useful in  patients with stable renal function. The eGFR calculation will not be reliable in acutely ill patients when serum creatinine is changing rapidly. It is not useful in patients on dialysis. The eGFR calculation may not be applicable to  patients at the low and high extremes of body sizes, pregnant women, and vegetarians.)  Anion Gap 9  Cholesterol, Serum 190  Triglycerides, Serum 94  HDL (INHOUSE) 51  VLDL Cholesterol Calculated 19  LDL Cholesterol Calculated  120 (Result(s) reported on 03 Aug 2014 at 05:05AM.)  Routine Hem:  23-Jan-16 04:18   WBC (CBC) 6.2  RBC (CBC) 4.07  Hemoglobin (CBC)  10.5  Hematocrit (CBC)  33.7  Platelet Count (CBC) 197  MCV 83  MCH  25.9  MCHC  31.3  RDW 14.2  Neutrophil % 71.8  Lymphocyte % 19.2  Monocyte % 7.2  Eosinophil % 1.6  Basophil % 0.2  Neutrophil # 4.4  Lymphocyte # 1.2  Monocyte # 0.4  Eosinophil # 0.1  Basophil # 0.0 (Result(s) reported on 03 Aug 2014 at 04:58AM.)   Radiology Results: Nuclear Med:    23-Jan-16 10:26, Hepatobiliary Image - Nuc Med  Hepatobiliary Image - Nuc Med   REASON FOR EXAM:    possible cystic duct obstruction on ultrasound.  DO   NOT USE CCK.  COMMENTS:       PROCEDURE: NM  - NM HEPATOBILIARY IMAGE  - Aug 03 2014 10:26AM     CLINICAL DATA:  Evaluate for cholecystitis    EXAM:  NUCLEAR MEDICINE HEPATOBILIARY IMAGING    TECHNIQUE:  Sequential images of the abdomen were obtained out to 60 minutes  following intravenous administration of radiopharmaceutical.  RADIOPHARMACEUTICALS:  8.02   Millicurie Tc-99m Choletec    COMPARISON:  Ultrasound 08/02/2014    FINDINGS:  Following the IV administration of the radiopharmaceutical there is  uniform tracer uptake by the liver with clearance from blood pool.  Radiotracer accumulation within the gallbladder occurs by 5 min.     IMPRESSION:  1. Negative exam. The cystic duct is patent without evidence for  acute cholecystitis.      Electronically Signed    By: Taylor  Stroud M.D.    On: 08/03/2014 10:30         Verified By: TAYLOR H. STROUD, M.D.,   Assessment/Plan:  Assessment/Plan:  Assessment 1) biliary pancreatitis-labs much improved today.  question of cystic duct obstruction  on US, negative on HIDA.  Probably passed offending stone/sludge.   Plan 1) continue current, awaiting surgical consult, May be of benefit for elective CCY as o/p.   Electronic Signatures: Skulskie, Martin (MD)  (Signed 23-Jan-16 12:09)  Authored: Chief Complaint, VITAL SIGNS/ANCILLARY NOTES, Brief Assessment, Lab Results, Radiology Results, Assessment/Plan   Last Updated: 23-Jan-16 12:09 by Skulskie, Martin (MD) 

## 2014-11-10 NOTE — Consult Note (Signed)
PATIENT NAME:  Kristy Travis, Kristy Travis MR#:  [id number] DATE OF BIRTH:  01/14/1966  DATE OF CONSULTATION:  08/02/2014  REFERRING PHYSICIAN:  Edavally Reddy, MD CONSULTING PHYSICIAN:  Martin U. Skulskie, MD  REASON FOR CONSULTATION: Gallstone pancreatitis.   HISTORY OF PRESENT ILLNESS: Ms. Tourville is a pleasant 48-year-old female who was admitted to the hospital earlier today with epigastric abdominal pain and diagnosis of gallstone pancreatitis.   She states that about 2 nights ago she began to have some epigastric pain that was nonradiating. There was some nausea and vomiting associated with it. She states that about 2 weeks ago she had the same type of symptoms, but she did not come to the hospital at that time. Her symptoms did not totally go away in the interim. In review with her, she has had similar symptoms, although not as severe, for at least a year. She has a recent history of a gastric sleeve being done in March of 2015. She has had about a 70 pound weight loss since that time. She was told "the gallbladder was bad, but they could not get to it at that particular procedure". It was stated to be enlarged. On evaluation on this hospitalization, she was found to have a normal bilirubin; however, her cystic duct appeared to be obstructed with a gallstone. Her common bile duct was 4 mm in size. She also had a hysterectomy about 6 weeks ago. She has been having some constipation since that time. She has been intermittently taking some pain medication she was given postoperatively for this new epigastric pain.   GASTROINTESTINAL FAMILY HISTORY: Pertinent for peptic ulcer disease as well as gallbladder surgeries in an aunt and a cousin.   PAST MEDICAL HISTORY:  1.  Gastroesophageal reflux, as stated.  2.  Gastric sleeve, as noted.  3.  Hysterectomy.  4.  Hypertension, apparently improved since her gastrectomy.   OUTPATIENT MEDICATIONS: Include acetaminophen/oxycodone 325/5 mg every 6 hours  p.r.n., biotin 300 mg 2 tablets once a day, calcium 600 plus D 3 times a day, docusate sodium 100 mg 1 capsule twice a day, ferrous sulfate 325 mg 2 tablets daily, fish oral capsule, multiple vitamin, omeprazole 20 mg once a day, vitamin D3 1,000 international units once a day.   ALLERGIES: No known drug allergies.  REVIEW OF SYSTEMS: Per admission history and physical, agree with same.   PHYSICAL EXAMINATION: VITAL SIGNS: Temperature 98.1, pulse 90, respirations 19, blood pressure 106/67, pulse ox 100%.  GENERAL: She is a 48-year-old female in no acute distress. She states that her pain is currently a 5/10. Prior to getting some pain medicine a bit ago, it was 7/10, that improved from admission.  HEENT: Normocephalic, atraumatic. Eyes: Anicteric. Nose: Septum midline. No lesions. Oropharynx: No lesions.  NECK: Supple. No JVD.  HEART: Regular rate and rhythm.  LUNGS: Clear.  ABDOMEN: Soft. There is tenderness to palpation in the epigastric region and somewhat across the left lower quadrant. There are no masses, rebound, or organomegaly. Bowel sounds are positive, normoactive.  RECTAL: Anorectal exam deferred.  EXTREMITIES: No clubbing, cyanosis, or edema.  NEUROLOGICAL: Cranial nerves II through XII grossly intact. Muscle strength bilaterally equal and symmetric.   DIAGNOSTIC DATA: Laboratories include the following: On admission to the hospital, she had a glucose of 103, BUN 11, creatinine 0.74, sodium 140, potassium 4, chloride 104, bicarb 30, calcium 9.5, lipase 8446. Hepatic profile: Total protein 7.6, albumin 3.9, total bilirubin 0.5, alk phos 124, AST 241, ALT 276. Her hemogram   was normal with a white count of 6.9. UA showed 2+ ketones, negative otherwise.   She had an abdominal ultrasound that showed the common bile duct at 4 mm, impression being nonmobile gallstones with gallbladder tenderness and distention, suspect gallbladder obstruction. No definitive inflammatory changes of acute  cholecystitis, although early acute cholecystitis could have this appearance. No biliary ductal enlargement. Of note, there was no comment on the pancreas as this was a limited ordered ultrasound.   ASSESSMENT: Acute pancreatitis, likely biliary in nature. The patient did undergo a bariatric type procedure less than a year ago and has had a weight loss of about 70 pounds. This would predispose her toward a lithogenic status with her gallbladder. However, it also sounds like she has been having some gallbladder symptomatology for at least a year or longer. She was self-medicating at home in regards to pain control since this similar episode about 2 weeks ago.   RECOMMENDATIONS: 1.  Recommend HIDA scan without CCK.   2.  Surgical consultation.  3.  Continue adequate hydration. Recheck labs in the morning. Her ultrasound does not show evidence of choledocholithiasis, and she will likely need a cholecystectomy at some point as her pancreatitis resolves.  ____________________________ Lollie Sails, MD mus:sb D: 08/02/2014 16:29:28 ET T: 08/02/2014 16:49:46 ET JOB#: [id number]  cc: Lollie Sails, MD, <Dictator> Lollie Sails MD ELECTRONICALLY SIGNED 08/05/2014 13:41

## 2014-11-10 NOTE — Discharge Summary (Signed)
PATIENT NAME:  Kristy Travis, Kristy Travis MR#:  [id number] DATE OF BIRTH:  [birth year]  DATE OF ADMISSION:  08/02/2014 DATE OF DISCHARGE:  08/06/2014  DISCHARGE DIAGNOSES: Gallstone pancreatitis, status post laparoscopic cholecystectomy.   DISCHARGE MEDICATIONS:  1. Omeprazole 20 mg p.o. daily as needed.  2. Biotin 10 mg 2 tablets once a day.  3. Ferrous sulfate 325 mg 2 tablets once a day.  4. Colace 100 mg p.o. b.i.d. as needed for constipation.  5. Percocet 5/325 one tablet 1 tablet every 6 hours as needed for pain. 6. Fish oil 1 capsule daily. 7. Multivitamin 1 tablet daily.  8. Calcium with vitamin D 1 tablet p.o. t.i.d.   DIET: Low-fat diet.   CONSULTATION:  Dr. Excell Seltzerooper from surgery.   HOSPITAL COURSE: This patient is a 49 year old female patient who came in because of abdominal pain and nausea and vomiting. The patient had a gastric sleeve surgery in March 2015, and she had some weight loss at that time. She had a recent gastric about 6 weeks ago. She has normal bilirubin with distal cystic duct obstruction with gallstones. Admitted to  medicine, started on p.o., IV fluids started.>. The patient's LFTs showed ALT of 276, AST of 241. The patient's total bilirubin 0.5. Ultrasound showed a CBD duct 4 mm, with nonmobile gallstones and gallbladder tenderness and distention, and gallbladder outlet obstruction. No evidence of cholecystitis. The patient kept n.p.o., IV fluids were started. The patient was seen by Dr. Marva PandaSkulskie and also Dr. Excell Seltzerooper. The patient's lipase was elevated at 593 on admission. Her CBC was normal with WBC 6.2, hemoglobin 10.5. The patient had a HIDA scan done, which showed a negative exam. Cystic duct is patent without evidence of acute cholecystitis; however, she continued to have pain, and the patient was seen by Dr. Excell Seltzerooper, as well. The patient's pancreatitis symptoms improved. Her lipase normalized. LFTs improved. The patient taken to laparoscopic cholecystectomy on January 24th  by Dr. Excell Seltzerooper. The patient did very well after surgery, and the patient stayed afebrile with minimal abdominal pain and went home in stable condition.   FOLLOWUP INSTRUCTIONS: Follow up with Dr. Excell Seltzerooper in clinic in about 1 week to 10 days regarding follow-up after surgery.  DISCHARGE PHYSICAL EXAMINATION:  VITAL SIGNS: Temperature 98.3, heart rate 86, blood pressure 109/70, saturations 97% on room air.  GENERAL: She is alert, awake, oriented.  CARDIOVASCULAR: S1, S2 regular.  LUNGS: Clear to auscultation.  ABDOMEN: Soft, nontender, nondistended. Bowel sounds present.  DISPOSITION: The patient went home in stable condition.   TIME SPENT: More than 30 minutes   ____________________________ Katha HammingSnehalatha Britzy Graul, MD sk:mw D: 08/21/2014 12:51:03 ET T: 08/21/2014 13:16:58 ET JOB#: [id number]  cc: Katha HammingSnehalatha Kharizma Lesnick, MD, <Dictator> Richard E. Excell Seltzerooper, MD  Katha HammingSNEHALATHA Tessi Eustache MD ELECTRONICALLY SIGNED 08/22/2014 15:37

## 2014-11-10 NOTE — Consult Note (Signed)
Brief Consult Note: Diagnosis: Gallstone pancreatitis.   Patient was seen by consultant.   Orders entered.   Discussed with Attending MD.   Comments: From 2 nights ago to last night, she had nausea, vomiting, and epigastric pain (which persists). Had a similar episode 2 weeks ago. Admitted with lipase > 8000, SGOT/PT ~ 250, and gallstones on U/S. No evidence of acute cholecystitis, in that there is no gallbladder wall thickening or pericholecystic fluid. WBC normal. Pt underwent gastric sleeve bariatric surgery here in March 2015 by Dr Smitty CordsBruce (she has lost 70 lbs and her weight loss is continuing), who, according to the family, "could not get to her gallbladder" at the time, even though she had known gallstones. She does not want Dr Smitty CordsBruce to do her surgery, and [facility]. I have D/C-ed her ABx as they are not indicated in pancreatitis and will only possibly serve to select for resistant organisms. Her pancreatitis must clinically resolve prior to considering laparoscopic cholecystectomy, but given the "history," she may need an open procedure. Will follow.  Electronic Signatures: Claude MangesMarterre, Justus Duerr F (MD)  (Signed 22-Jan-16 16:29)  Authored: Brief Consult Note   Last Updated: 22-Jan-16 16:29 by Claude MangesMarterre, Yara Tomkinson F (MD)

## 2014-11-10 NOTE — H&P (Signed)
PATIENT NAME:  Kristy Travis, Kristy Travis MR#:  [id number] DATE OF BIRTH:  1966-04-10  DATE OF ADMISSION:  08/02/2014  REFERRING DOCTOR: Enedina Finner. Manson Passey, MD   PRIMARY CARE PRACTITIONER:  Dr. Carolan Clines.  ADMITTING DOCTOR: Crissie Figures, MD    CHIEF COMPLAINT: Epigastric pain ongoing for the past 2 weeks with further worsening with associated nausea and vomiting of 1 day duration.    HISTORY OF PRESENT ILLNESS: A 49 year old pleasant female with a past medical history of gastroesophageal reflux disease, status post lap sleeve gastrectomy presents with the complaints of epigastric pain ongoing for the past 2 weeks, which worsened yesterday and was associated with nausea and vomiting of 1 day duration. No history of any fever. No blood in the vomitus. No diarrhea. No chest pain. No shortness of breath. No urinary symptoms. In the Emergency Room, the patient was evaluated by the ED physician and was found to have elevated lipase of 8446 and elevated liver function tests. Further workup involved ultrasound of the abdomen, which revealed gallstones which are fixed in the gallbladder neck with distention. The patient received IV pain medications and also IV fluids in the Emergency Room and the hospitalist service was consulted for further evaluation and management. Her abdominal pain is under control with pain medications, and she states she is feeling slightly better.   PAST MEDICAL HISTORY:  1.  Gastroesophageal reflux disease.  2.  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  3.  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   PAST SURGICAL HISTORY:  1.  Lap sleeve gastrectomy in March 2015.  2.  Status post hysterectomy in December 2015.   ALLERGIES: No known drug allergies.   HOME MEDICATIONS: Omeprazole.   FAMILY HISTORY: Significant for hypertension and diabetes mellitus.   SOCIAL HISTORY: She is married, 2 children. Denies any history of alcohol, substance  abuse, or smoking.   REVIEW OF SYSTEMS:  CONSTITUTIONAL: Negative for fever or chills. No fatigue. No generalized weakness.  EYES: Negative for blurred vision, double vision. No pain. No redness. No discharge.  EARS, NOSE, AND THROAT: For tinnitus, ear pain, hearing loss. No epistaxis, nasal discharge, or difficulty swallowing.  RESPIRATORY: Negative for cough, wheezing, dyspnea, hemoptysis, painful respirations.  CARDIOVASCULAR: Negative for chest pain, palpitations, dizziness, shortness of breath, syncopal episodes, orthopnea, dyspnea on exertion, or pedal edema.  GASTROINTESTINAL: Positive for epigastric pain ongoing for the past 2 weeks which worsened yesterday and associated nausea and vomiting present. No diarrhea. No hematemesis. History of chronic GERD symptoms, takes a proton pump inhibitor with control.  GENITOURINARY: Negative for dysuria, frequency, urgency, hematuria.  ENDOCRINE: Negative for polyuria, nocturia, heat or cold intolerance.  HEMATOLOGIC AND LYMPHATIC: Negative for anemia, easy bruising or bleeding, or swollen glands. SKIN: Negative for acne, skin rash, or lesions.  MUSCULOSKELETAL: Negative for neck pain or back pain. No shoulder pain.  NEUROLOGICAL: Negative for focal weakness or numbness. No history of CVA, TIA, or seizure disorder.  PSYCHIATRIC: Negative for anxiety or depression.   PHYSICAL EXAMINATION:  VITAL SIGNS: Temperature 98 degrees Fahrenheit, pulse rate 104 per minute, respirations 16 per minute, blood pressure 136/88, oxygen saturation 95% on room air.  GENERAL: Well-developed, well-nourished, alert and in no acute distress at this time, resting in the bed.  HEAD: Atraumatic, normocephalic.  EYES: Pupils are equal, react to light and accommodation. No conjunctival pallor. No icterus. Extraocular movements intact.  NOSE: No drainage. No lesions.  EARS: No drainage. No external lesions.  ORAL  CAVITY: No mucosal lesions. No exudates.  NECK: Supple. No  JVD. No thyromegaly. No carotid bruit. Range of motion of neck normal.  RESPIRATORY: Good respiratory effort. Not using accessory muscles of respiration. Bilateral vesicular breath sounds. No rales or rhonchi.  CARDIOVASCULAR: S1, S2 regular. No murmurs, gallops, or clicks appreciated. Peripheral pulses equal at carotid, femoral, and pedal pulses. No peripheral edema.  GASTROINTESTINAL: Abdomen epigastric tenderness moderate present. No guarding. No rigidity. Bowel sounds present and equal in all 4 quadrants.  GENITOURINARY: Deferred.   MUSCULOSKELETAL: No joint tenderness or effusion. Range of motion is adequate. Strength and tone equal bilaterally.  SKIN: Inspection and normal limits. No obvious wounds.  LYMPHATIC: No cervical lymphadenopathy.  VASCULAR: Good dorsalis pedis and posterior tibial pulses.  NEUROLOGICAL: Alert, awake, and oriented x 3. Cranial nerves II-XII grossly intact. No sensory deficit. Motor strength 5/5 in bilateral. DTRs 2+ bilateral, symmetrical. Plantars downgoing.  PSYCHIATRIC: Alert, awake, and oriented x 3. Judgment, insight adequate. Memory and mood within normal limits.   LABORATORY DATA: Serum glucose 103, BUN 11, creatinine 0.74, sodium 140, potassium 4.0, chloride 104, bicarbonate 30, total calcium 9.5. Lipase 8446, total protein 7.6, albumin 3.9, total bilirubin 0.5, alkaline phosphatase 124, AST 241, ALT 276. WBC 6.9, hemoglobin 13.2, hematocrit 40.7, platelet count 275,000. Urinalysis unremarkable.   IMAGING STUDIES: Ultrasound of the gallbladder:  1.  Nonmobile gallstones with gallbladder tenderness and distention, suspect gallbladder obstruction. No definitive inflammatory changes of acute cholecystitis although early acute cholecystitis could have this appearance.  2.  No biliary ductal enlargement.    ASSESSMENT AND PLAN: A 49 year old female with a past medical history of gastroesophageal reflux disease, status laparoscopic sleeve gastrectomy presents with  epigastric pain ongoing for the past 2 weeks with worsening of 1 day duration with associated nausea and vomiting, found to have elevated lipase and elevated liver functions and ultrasound of abdomen positive for gallbladder stones.  1.  Acute pancreatitis secondary to gallbladder stones. PLAN: Admit, keep her n.p.o. except medications, intravenous pain medications, intravenous fluids. Surgical consultation and gastrointestinal consultation. Follow up labs. Check fasting serum lipids.  2.  History of gastroesophageal reflux disease: Stable on proton pump inhibitor. Continue same.  3.  Deep vein thrombosis prophylaxis: Subcutaneous heparin.  CODE STATUS: Full code.   TIME SPENT: 45 minutes.    ____________________________ Crissie FiguresEdavally N. Raylynn Hersh, MD enr:bm D: 08/02/2014 03:25:09 ET T: 08/02/2014 03:47:22 ET JOB#: [id number]  cc: Crissie FiguresEdavally N. Naveen Lorusso, MD, <Dictator> Carolan ClinesMargaret Smith, MD  Crissie FiguresEDAVALLY N Ronee Ranganathan MD ELECTRONICALLY SIGNED 08/03/2014 20:54

## 2014-11-10 NOTE — Op Note (Signed)
PATIENT NAME:  Kristy Travis, Kristy Travis MR#:  [id number] DATE OF BIRTH:  Oct 04, 1965  DATE OF PROCEDURE:  08/05/2014  PREOPERATIVE DIAGNOSIS: Choledocholithiasis.   POSTOPERATIVE DIAGNOSIS: Choledocholithiasis.   PROCEDURE: Laparoscopic cholecystectomy with C-arm fluoroscopic cholangiography.   SURGEON: Richard E. Excell Seltzer, MD  ASSISTANT: (Dictation Anomaly) <<Bree   T>> Laural Benes, PA-S   INDICATIONS: This is a patient with a history of biliary pancreatitis and choledocholithiasis. She is here for semi-elective laparoscopic cholecystectomy with cholangiography due to her history. Preoperatively, we discussed rationale for surgery, the options of observation, risk of bleeding, infection, recurrence of symptoms, failure to resolve her symptoms, open procedure, bile duct damage, bile duct leak, retained common bile duct stone, any of which could require further surgery and/or ERCP, stent papillotomy. This was all reviewed for her in the preoperative holding area as well. She understood and agreed to proceed.   FINDINGS: Mild acute cholecystitis with edematous gallbladder. C-arm fluoroscopic cholangiography demonstrated that the cystic duct had been cannulated. The proximal ducts were well identified. The distal ducts were all identified without intraluminal filling defects with the exception of what appeared to be an air bubble moving with injection, did not appear to be a stone.   There was good flow in the duodenum, as well.   DESCRIPTION OF PROCEDURE: The patient was induced under general anesthesia. She was given IV antibiotics and VTE prophylaxis was in place. She was prepped and draped in a sterile fashion. Marcaine was infiltrated in the skin and subcutaneous tissues around the supraumbilical area. Incision was made. Veress needle was placed. Pneumoperitoneum was obtained. A 5 mm trocar port was placed. The abdominal cavity was explored, and under direct vision, a 10 mm epigastric port and 2 lateral  5 mm ports were placed. The camera was placed in the epigastric site to view back to the periumbilical site. There were adhesions in the pelvis, but none at the area near the trocar and no sign of bowel injury.   With the camera in the periumbilical site, the gallbladder was placed on tension. The peritoneum over the infundibulum was incised bluntly. The cystic duct and gallbladder junction was well identified. The cystic duct and gallbladder junction was then clipped and incised, and through a separate incision, an Angiocath cholangiogram catheter was placed and held in with a clip. C-arm fluoroscopic cholangiography demonstrated the above. There appeared to be a small intraluminal filling defect that was a bubble because it moved with injection and did not seem to be significant. All other anatomy was properly defined.   The cholangiogram catheter was then removed. The cystic duct was triply clipped and divided. The cystic artery was doubly clipped and divided in 2 branches and the gallbladder sacrum and the gallbladder fossa with electrocautery, passed out through the epigastric port site with the aid of an Endo Catch bag. The area was checked for hemostasis. There was a small area of arterial bleeding at the edge of the gallbladder wall fossa, which was clipped as well and this controlled hemorrhage. There was no further bleeding. No sign of bile leak. The area was irrigated with copious amounts of normal saline. There was no sign of bile leak, bowel injury or bleeding. Therefore, the camera was placed in the epigastric site to view back to the periumbilical site. Again, no sign of bowel injury. Therefore, pneumoperitoneum was released. All ports were removed. Fascial edges of the epigastric site were approximated with figure-of-eight 0 Vicryls and then 4-0 subcuticular Monocryl was used on all skin edges.  Steri-Strips, Mastisol and sterile dressings were placed.   The patient tolerated the procedure well.  There were no complications. She was taken to the recovery room in stable condition to be admitted for continued care.    ____________________________ Adah Salvageichard E. Excell Seltzerooper, MD rec:TT D: 08/05/2014 14:09:05 ET T: 08/05/2014 16:06:15 ET JOB#: [id number]  cc: Adah Salvageichard E. Excell Seltzerooper, MD, <Dictator> Lattie HawICHARD E COOPER MD ELECTRONICALLY SIGNED 08/05/2014 19:00

## 2014-11-10 NOTE — Consult Note (Signed)
Chief Complaint:  Subjective/Chief Complaint seen for biliary pancreatitis.  continues to improve.  no n/v, 2/10 abdominal pain, epigastric. passing flatus   VITAL SIGNS/ANCILLARY NOTES: **Vital Signs.:   24-Jan-16 08:00  Temperature Temperature (F) 97.9  Celsius 36.6  Temperature Source oral  Pulse Pulse 88  Respirations Respirations 18  Systolic BP Systolic BP 93  Diastolic BP (mmHg) Diastolic BP (mmHg) 62  Mean BP 72  Pulse Ox % Pulse Ox % 100  Pulse Ox Activity Level  At rest  Oxygen Delivery Room Air/ 21 %   Brief Assessment:  Cardiac Regular   Respiratory clear BS   Gastrointestinal details normal Soft  Nondistended  No masses palpable  No rebound tenderness  positive bowel sounds, tender to palpation in the epigastrum   Lab Results: Hepatic:  24-Jan-16 03:52   Bilirubin, Total 0.3  Alkaline Phosphatase 83 (46-116 NOTE: New Reference Range 01/29/14)  SGPT (ALT)  91 (14-63 NOTE: New Reference Range 01/29/14)  SGOT (AST) 30  Total Protein, Serum  6.0  Albumin, Serum  2.9  Routine Chem:  21-Jan-16 22:04   Lipase  8446 (Result(s) reported on 01 Aug 2014 at 10:37PM.)  23-Jan-16 04:18   Lipase  593 (Result(s) reported on 03 Aug 2014 at 04:59AM.)  24-Jan-16 03:52   Amylase, Serum 67 (Result(s) reported on 04 Aug 2014 at 04:38AM.)  Lipase 196 (Result(s) reported on 04 Aug 2014 at 04:38AM.)  Glucose, Serum 83  BUN  5  Creatinine (comp)  0.55  Sodium, Serum 142  Potassium, Serum 3.9  Chloride, Serum  108  CO2, Serum 26  Calcium (Total), Serum 8.9  Osmolality (calc) 280  eGFR (African American) >60  eGFR (Non-African American) >60 (eGFR values <65m/min/1.73 m2 may be an indication of chronic kidney disease (CKD). Calculated eGFR, using the MRDR Study equation, is useful in  patients with stable renal function. The eGFR calculation will not be reliable in acutely ill patients when serum creatinine is changing rapidly. It is not useful in patients on  dialysis. The eGFR calculation may not be applicable to patients at the low and high extremes of body sizes, pregnant women, and vegetarians.)  Anion Gap 8  Routine Hem:  24-Jan-16 03:52   WBC (CBC) 6.3  RBC (CBC) 3.98  Hemoglobin (CBC)  10.4  Hematocrit (CBC)  32.7  Platelet Count (CBC) 205  MCV 82  MCH 26.3  MCHC 32.0  RDW 14.1  Neutrophil % 63.2  Lymphocyte % 25.6  Monocyte % 7.7  Eosinophil % 3.0  Basophil % 0.5  Neutrophil # 4.0  Lymphocyte # 1.6  Monocyte # 0.5  Eosinophil # 0.2  Basophil # 0.0 (Result(s) reported on 04 Aug 2014 at 04:40AM.)   Radiology Results: [place]    22-Jan-16 01:50, [place]  [place]   REASON FOR EXAM:    RUQ/epigastric pain lipase 8446  COMMENTS:   Body Site: Gallbladder, Liver, Common Bile Duct    PROCEDURE: [place] - [place] LIMITED SURVEY  - Aug 02 2014  1:50AM     CLINICAL DATA:  Right upper quadrant pain with elevated lipase    EXAM:  [place] LIMITED - RIGHT UPPER QUADRANT    COMPARISON:  08/16/2013    FINDINGS:  Gallbladder:  Gallstones which are fixed in the gallbladder neck. The gallbladder  is distended and there is borderline wall thickening up to 3 mm, but  no striation or pericholecystic edema. Per sonographer exam the  gallbladder is focally  tender.    Common bile duct:    Diameter: 4 mm Where visualized, no filling defect.    Liver:    No focal lesion identified. Within normal limits in parenchymal  echogenicity.     IMPRESSION:  1. Non mobile gallstones with gallbladder tenderness and distension  - suspect gallbladder obstruction. No definitive inflammatory  changes of acute cholecystitis, although early acute cholecystitis  could have this appearance.  2. No biliary ductal enlargement.      Electronically Signed    By: Jorje Guild M.D.    On: 08/02/2014 02:00         Verified By: Gilford Silvius, M.D.,  Nuclear Med:    23-Jan-16 10:26, Hepatobiliary Image -  Nuc Med  Hepatobiliary Image - Nuc Med   REASON FOR EXAM:    possible cystic duct obstruction on ultrasound.  DO   NOT USE CCK.  COMMENTS:       PROCEDURE: NM  - NM HEPATOBILIARY IMAGE  - Aug 03 2014 10:26AM     CLINICAL DATA:  Evaluate for cholecystitis    EXAM:  NUCLEAR MEDICINE HEPATOBILIARY IMAGING    TECHNIQUE:  Sequential images of the abdomen were obtained out to 60 minutes  following intravenous administration of radiopharmaceutical.  RADIOPHARMACEUTICALS:  1.58 Millicurie EW-25R Choletec    COMPARISON:  Ultrasound 08/02/2014    FINDINGS:  Following the IV administration of the radiopharmaceutical there is  uniform tracer uptake by the liver with clearance from blood pool.  Radiotracer accumulation within the gallbladder occurs by 5 min.     IMPRESSION:  1. Negative exam. The cystic duct is patent without evidence for  acute cholecystitis.      Electronically Signed    By: Kerby Moors M.D.    On: 08/03/2014 10:30         Verified By: Angelita Ingles, M.D.,   Assessment/Plan:  Assessment/Plan:  Assessment 1) biliary pancreatitis-improving.  lipase normal, much less abd pain,   Plan 1) agree with clears, possible ccy per surgery. will follow at a distance.   Electronic Signatures: Loistine Simas (MD)  (Signed 24-Jan-16 15:01)  Authored: Chief Complaint, VITAL SIGNS/ANCILLARY NOTES, Brief Assessment, Lab Results, Radiology Results, Assessment/Plan   Last Updated: 24-Jan-16 15:01 by Loistine Simas (MD)

## 2014-12-02 ENCOUNTER — Telehealth: Payer: Self-pay | Admitting: *Deleted

## 2014-12-02 MED ORDER — FLUCONAZOLE 150 MG PO TABS
150.0000 mg | ORAL_TABLET | Freq: Once | ORAL | Status: DC
Start: 2014-12-02 — End: 2015-02-18

## 2014-12-02 NOTE — Telephone Encounter (Signed)
Pt called stating she needs a appt for a yeast infection but she could only come Thursday because she works first and she is off that day or she could come the following Tuesday I made her aware I didn't have anything for those days but I have something this afternoon, tomorrow and Wednesday, pt stated she had to work, pt states she has had this yeast infection for 2 days, pt wants to see if something can be called in or can she be worked in on Thursday. Please advise pt's main work number [phone]

## 2014-12-02 NOTE — Telephone Encounter (Signed)
Med sent to pharmacy.  Attempted to reach patient on # listed but received no answer.  No option to leave a message.

## 2014-12-02 NOTE — Telephone Encounter (Signed)
Is it ok to send 1 fluconazole?

## 2014-12-02 NOTE — Telephone Encounter (Signed)
pls send fluconazole 150 mg #1 tabs, tell her one should be enough to fully treat  Needs to come in at some time for regular f/u

## 2014-12-02 NOTE — Addendum Note (Signed)
Addended by: Kandis FantasiaSLADE, COURTNEY B on: 12/02/2014 11:17 AM   Modules accepted: Orders

## 2015-02-18 ENCOUNTER — Encounter: Payer: Self-pay | Admitting: Family Medicine

## 2015-02-18 ENCOUNTER — Ambulatory Visit (INDEPENDENT_AMBULATORY_CARE_PROVIDER_SITE_OTHER): Payer: 59 | Admitting: Family Medicine

## 2015-02-18 VITALS — BP 124/82 | HR 66 | Resp 16 | Ht 65.0 in | Wt 190.0 lb

## 2015-02-18 DIAGNOSIS — R5382 Chronic fatigue, unspecified: Secondary | ICD-10-CM

## 2015-02-18 DIAGNOSIS — R10A1 Flank pain, right side: Secondary | ICD-10-CM | POA: Insufficient documentation

## 2015-02-18 DIAGNOSIS — R109 Unspecified abdominal pain: Secondary | ICD-10-CM

## 2015-02-18 DIAGNOSIS — E559 Vitamin D deficiency, unspecified: Secondary | ICD-10-CM

## 2015-02-18 DIAGNOSIS — E8881 Metabolic syndrome: Secondary | ICD-10-CM

## 2015-02-18 DIAGNOSIS — Z23 Encounter for immunization: Secondary | ICD-10-CM

## 2015-02-18 DIAGNOSIS — N3090 Cystitis, unspecified without hematuria: Secondary | ICD-10-CM

## 2015-02-18 DIAGNOSIS — I10 Essential (primary) hypertension: Secondary | ICD-10-CM

## 2015-02-18 DIAGNOSIS — E669 Obesity, unspecified: Secondary | ICD-10-CM

## 2015-02-18 DIAGNOSIS — E785 Hyperlipidemia, unspecified: Secondary | ICD-10-CM

## 2015-02-18 DIAGNOSIS — Z1159 Encounter for screening for other viral diseases: Secondary | ICD-10-CM

## 2015-02-18 DIAGNOSIS — R5381 Other malaise: Secondary | ICD-10-CM

## 2015-02-18 LAB — COMPLETE METABOLIC PANEL WITH GFR
ALT: 14 U/L (ref 6–29)
AST: 17 U/L (ref 10–35)
Albumin: 4.2 g/dL (ref 3.6–5.1)
Alkaline Phosphatase: 72 U/L (ref 33–115)
BUN: 13 mg/dL (ref 7–25)
CO2: 29 mmol/L (ref 20–31)
Calcium: 9.7 mg/dL (ref 8.6–10.2)
Chloride: 105 mmol/L (ref 98–110)
Creat: 0.61 mg/dL (ref 0.50–1.10)
GFR, Est African American: >89 mL/min (ref >=60)
GFR, Est Non African American: >89 mL/min (ref >=60)
Glucose, Bld: 83 mg/dL (ref 65–99)
Potassium: 4.5 mmol/L (ref 3.5–5.3)
Sodium: 142 mmol/L (ref 135–146)
Total Bilirubin: 0.5 mg/dL (ref 0.2–1.2)
Total Protein: 6.9 g/dL (ref 6.1–8.1)

## 2015-02-18 LAB — POCT URINALYSIS DIPSTICK
Bilirubin, UA: NEGATIVE
Blood, UA: NEGATIVE
Glucose, UA: NEGATIVE
Ketones, UA: NEGATIVE
Leukocytes, UA: NEGATIVE
Nitrite, UA: NEGATIVE
Protein, UA: NEGATIVE
Spec Grav, UA: 1.02
Urobilinogen, UA: 1
pH, UA: 8.5

## 2015-02-18 LAB — CBC WITH DIFFERENTIAL/PLATELET
Basophils Absolute: 0 10*3/uL (ref 0.0–0.1)
Basophils Relative: 0 % (ref 0–1)
Eosinophils Absolute: 0.1 10*3/uL (ref 0.0–0.7)
Eosinophils Relative: 2 % (ref 0–5)
HCT: 37.4 % (ref 36.0–46.0)
Hemoglobin: 12.2 g/dL (ref 12.0–15.0)
Lymphocytes Relative: 33 % (ref 12–46)
Lymphs Abs: 1.9 10*3/uL (ref 0.7–4.0)
MCH: 26.2 pg (ref 26.0–34.0)
MCHC: 32.6 g/dL (ref 30.0–36.0)
MCV: 80.3 fL (ref 78.0–100.0)
MPV: 9.7 fL (ref 8.6–12.4)
Monocytes Absolute: 0.4 10*3/uL (ref 0.1–1.0)
Monocytes Relative: 6 % (ref 3–12)
Neutro Abs: 3.5 10*3/uL (ref 1.7–7.7)
Neutrophils Relative %: 59 % (ref 43–77)
Platelets: 265 10*3/uL (ref 150–400)
RBC: 4.66 MIL/uL (ref 3.87–5.11)
RDW: 14.6 % (ref 11.5–15.5)
WBC: 5.9 10*3/uL (ref 4.0–10.5)

## 2015-02-18 LAB — IRON: Iron: 129 ug/dL (ref 42–145)

## 2015-02-18 LAB — FERRITIN: Ferritin: 16 ng/mL (ref 10–291)

## 2015-02-18 LAB — TSH: TSH: 1.358 u[IU]/mL (ref 0.350–4.500)

## 2015-02-18 LAB — LIPID PANEL
Cholesterol: 224 mg/dL — ABNORMAL HIGH (ref 125–200)
HDL: 74 mg/dL (ref >=46)
LDL Cholesterol: 128 mg/dL (ref <130)
Total CHOL/HDL Ratio: 3 Ratio (ref <=5.0)
Triglycerides: 109 mg/dL (ref <150)
VLDL: 22 mg/dL (ref <30)

## 2015-02-18 MED ORDER — OMEPRAZOLE 20 MG PO CPDR: 20.0000 mg | DELAYED_RELEASE_CAPSULE | Freq: Every day | ORAL | Status: DC

## 2015-02-18 MED ORDER — AMPHETAMINE-DEXTROAMPHETAMINE 10 MG PO TABS: 10.0000 mg | ORAL_TABLET | Freq: Every day | ORAL | Status: DC

## 2015-02-18 NOTE — Patient Instructions (Addendum)
F/u with rectal in 4 weeks, call if you need me before  New med for chronic fatigue, you will need to suign a pain contract  CONGRATS on weightloss  You are referred for [place] of right flank due to pain  PLEASE schedule mammogram, past due, last done 4 years ago  CBC, iron, and ferritin and B12 levels, vit D, lipid, cmp and EGFr, TSH, HIV   You will get a script for aderall, do not fill until 8/11 AFTER I have seen lab to ensure thyroid is oK, also pain contract needed It is important that you exercise regularly at least 30 minutes 5 times a week. If you develop chest pain, have severe difficulty breathing, or feel very tired, stop exercising immediately and seek medical attention    TdAP today   Thanks for choosing [facility], we consider it a privelige to serve you.   Please work on good  health habits so that your health will improve. 1. Commitment to daily physical activity for 30 to 60  minutes, if you are able to do this.  2. Commitment to wise food choices. Aim for half of your  food intake to be vegetable and fruit, one quarter starchy foods, and one quarter protein. Try to eat on a regular schedule  3 meals per day, snacking between meals should be limited to vegetables or fruits or small portions of nuts. 64 ounces of water per day is generally recommended, unless you have specific health conditions, like heart failure or kidney failure where you will need to limit fluid intake.  3. Commitment to sufficient and a  good quality of physical and mental rest daily, generally between 6 to 8 hours per day.  WITH PERSISTANCE AND PERSEVERANCE, THE IMPOSSIBLE , BECOMES THE NORM!

## 2015-02-18 NOTE — Progress Notes (Signed)
   Subjective:    Patient ID: Kristy Travis, female    DOB: 12-29-1965, 49 y.o.   MRN: [id number]  HPI The PT is here for follow up and re-evaluation of chronic medical conditions, medication management and review of any available recent lab and radiology data.  Preventive health is updated, specifically  Cancer screening and Immunization.   Questions or concerns regarding consultations or procedures which the PT has had in the interim are  addressed. The PT denies any adverse reactions to current medications since the last visit.  C/o excessive fatigue, falls asleep when she sits for a while, no sleeping while driving, pr bypass sleep study mild sleep apnea Right flank pain worse with pressure and movement x 1 week, disturbs also has urinary frequency and discomfort foe 1 week, denies fever, chills or gross hematuria      Review of Systems See HPI Denies recent fever or chills. Denies sinus pressure, nasal congestion, ear pain or sore throat. Denies chest congestion, productive cough or wheezing. Denies chest pains, palpitations and leg swelling Denies abdominal pain, nausea, vomiting,diarrhea or constipation.   . Denies joint pain, swelling and limitation in mobility. Denies headaches, seizures, numbness, or tingling. Denies depression, anxiety or insomnia. Denies skin break down or rash.        Objective:   Physical Exam BP 124/82 mmHg  Pulse 66  Resp 16  Ht  (1.651 m)  Wt 190 lb (86.183 kg)  BMI 31.62 kg/m2  SpO2 100% Patient alert and oriented and in no cardiopulmonary distress.  HEENT: No facial asymmetry, EOMI,   oropharynx pink and moist.  Neck supple no JVD, no mass.  Chest: Clear to auscultation bilaterally.  CVS: S1, S2 no murmurs, no S3.Regular rate.  ABD: Soft right flank tenderness, no suprapubic tenderness, guarding or rebound, normal bS, no organomegaly or  mass  Ext: No edema  MS: Decreased  ROM thoracic  Spine,adequate in  shoulders, hips and knees.  Skin: Intact, no ulcerations or rash noted.  Psych: Good eye contact, normal affect. Memory intact not anxious or depressed appearing.  CNS: CN 2-12 intact, power,  normal throughout.no focal deficits noted.       Assessment & Plan:  Cystitis Symptomatic, however uA is normal  Obesity Improved. Pt applauded on succesful weight loss through lifestyle change, and encouraged to continue same. Weight loss goal set for the next several months.   Chronic fatigue and malaise Reports essentially normal sleep study prior to bariatric surgery in 2015, has suffered from chronic fatigue for years Will check thyroid function and anemia status, if neither abnormal, start adderall  HTN (hypertension) Controlled, no change in manangement,  Of fof medication following weight loss and change in diet DASH diet and commitment to daily physical activity for a minimum of 30 minutes discussed and encouraged, as a part of hypertension management. The importance of attaining a healthy weight is also discussed.  BP/Weight 02/18/2015 10/30/2013 06/20/2013 04/30/2013 12/20/2012 11/15/2012 08/21/2012  Systolic BP 124 142 150 158 122 142 148  Diastolic BP 82 92 82 88 76 90 90  Wt. (Lbs) 190 230.04 256 256.12 248 244.12 238  BMI 31.62 38.28 42.6 42.62 41.27 40.62 39.61        Vitamin D deficiency Updated lab needed at/ before next visit.   Need for Tdap vaccination After obtaining informed consent, the vaccine is  administered by LPN.   Right flank pain Disabling right flan pain, normal UA, needs [place]

## 2015-02-19 LAB — VITAMIN D 25 HYDROXY (VIT D DEFICIENCY, FRACTURES): Vit D, 25-Hydroxy: 25 ng/mL — ABNORMAL LOW (ref 30–100)

## 2015-02-19 LAB — HIV ANTIBODY (ROUTINE TESTING W REFLEX): HIV 1&2 Ab, 4th Generation: NONREACTIVE

## 2015-02-20 ENCOUNTER — Telehealth: Payer: Self-pay | Admitting: Family Medicine

## 2015-02-20 ENCOUNTER — Ambulatory Visit
Admission: RE | Admit: 2015-02-20 | Discharge: 2015-02-20 | Disposition: A | Discharge status: Home / Self Care | Payer: 59 | Source: Ambulatory Visit | Attending: Family Medicine | Admitting: Family Medicine

## 2015-02-20 DIAGNOSIS — R109 Unspecified abdominal pain: Secondary | ICD-10-CM | POA: Diagnosis not present

## 2015-02-20 MED ORDER — VITAMIN D (ERGOCALCIFEROL) 1.25 MG (50000 UNIT) PO CAPS: 50000.0000 [IU] | ORAL_CAPSULE | ORAL | Status: DC

## 2015-02-20 NOTE — Progress Notes (Signed)
Patient aware and med sent  

## 2015-02-20 NOTE — Telephone Encounter (Signed)
See result message. Will return patient call

## 2015-02-20 NOTE — Telephone Encounter (Signed)
Patient is calling requesting lab results from 02/11/15, please advise?

## 2015-02-24 ENCOUNTER — Telehealth: Payer: Self-pay

## 2015-02-24 DIAGNOSIS — N200 Calculus of kidney: Secondary | ICD-10-CM

## 2015-02-24 NOTE — Telephone Encounter (Signed)
Patient referred to Dr. Cherylann Ratel as requested for abnormal US Renal with findings of kidney stone

## 2015-02-24 NOTE — Telephone Encounter (Signed)
-----   Message from Kerri Perches, MD sent at 02/21/2015  9:53 AM EDT ----- pls let her know she appears to have a kidney stone , which is not causing any obstruction, pls refer her to urologist of her choice to further assess since she has pain

## 2015-02-25 NOTE — Addendum Note (Signed)
Addended by: Kandis Fantasia B on: 02/25/2015 10:47 AM   Modules accepted: Orders

## 2015-02-28 ENCOUNTER — Other Ambulatory Visit: Payer: Self-pay

## 2015-02-28 DIAGNOSIS — R5382 Chronic fatigue, unspecified: Principal | ICD-10-CM

## 2015-02-28 DIAGNOSIS — R5381 Other malaise: Secondary | ICD-10-CM

## 2015-02-28 MED ORDER — AMPHETAMINE-DEXTROAMPHETAMINE 10 MG PO TABS: 10.0000 mg | ORAL_TABLET | Freq: Every day | ORAL | Status: DC

## 2015-03-17 DIAGNOSIS — N3090 Cystitis, unspecified without hematuria: Secondary | ICD-10-CM | POA: Insufficient documentation

## 2015-03-17 DIAGNOSIS — Z23 Encounter for immunization: Secondary | ICD-10-CM | POA: Insufficient documentation

## 2015-03-17 NOTE — Assessment & Plan Note (Signed)
Reports essentially normal sleep study prior to bariatric surgery in 2015, has suffered from chronic fatigue for years Will check thyroid function and anemia status, if neither abnormal, start adderall

## 2015-03-17 NOTE — Assessment & Plan Note (Signed)
Updated lab needed at/ before next visit.   

## 2015-03-17 NOTE — Assessment & Plan Note (Signed)
Disabling right flan pain, normal UA, needs [place]

## 2015-03-17 NOTE — Assessment & Plan Note (Signed)
Symptomatic, however uA is normal

## 2015-03-17 NOTE — Assessment & Plan Note (Signed)
After obtaining informed consent, the vaccine is  administered by LPN.  

## 2015-03-17 NOTE — Assessment & Plan Note (Signed)
Improved. Pt applauded on succesful weight loss through lifestyle change, and encouraged to continue same. Weight loss goal set for the next several months.  

## 2015-03-17 NOTE — Assessment & Plan Note (Addendum)
Controlled, no change in manangement,  Of fof medication following weight loss and change in diet DASH diet and commitment to daily physical activity for a minimum of 30 minutes discussed and encouraged, as a part of hypertension management. The importance of attaining a healthy weight is also discussed.  BP/Weight 02/18/2015 10/30/2013 06/20/2013 04/30/2013 12/20/2012 11/15/2012 08/21/2012  Systolic BP 124 142 150 158 122 142 148  Diastolic BP 82 92 82 88 76 90 90  Wt. (Lbs) 190 230.04 256 256.12 248 244.12 238  BMI 31.62 38.28 42.6 42.62 41.27 40.62 39.61

## 2015-03-20 ENCOUNTER — Encounter: Payer: Self-pay | Admitting: Family Medicine

## 2015-03-20 ENCOUNTER — Ambulatory Visit (INDEPENDENT_AMBULATORY_CARE_PROVIDER_SITE_OTHER): Payer: 59 | Admitting: Family Medicine

## 2015-03-20 ENCOUNTER — Other Ambulatory Visit: Payer: Self-pay

## 2015-03-20 ENCOUNTER — Other Ambulatory Visit (HOSPITAL_COMMUNITY)
Admission: RE | Admit: 2015-03-20 | Discharge: 2015-03-20 | Disposition: A | Discharge status: Home / Self Care | Payer: 59 | Source: Ambulatory Visit | Attending: Family Medicine | Admitting: Family Medicine

## 2015-03-20 VITALS — BP 124/84 | HR 70 | Resp 16 | Ht 65.0 in | Wt 191.4 lb

## 2015-03-20 DIAGNOSIS — R1010 Upper abdominal pain, unspecified: Secondary | ICD-10-CM

## 2015-03-20 DIAGNOSIS — R5382 Chronic fatigue, unspecified: Secondary | ICD-10-CM

## 2015-03-20 DIAGNOSIS — R109 Unspecified abdominal pain: Secondary | ICD-10-CM | POA: Insufficient documentation

## 2015-03-20 DIAGNOSIS — N3090 Cystitis, unspecified without hematuria: Secondary | ICD-10-CM

## 2015-03-20 DIAGNOSIS — M5441 Lumbago with sciatica, right side: Secondary | ICD-10-CM

## 2015-03-20 DIAGNOSIS — Z113 Encounter for screening for infections with a predominantly sexual mode of transmission: Secondary | ICD-10-CM | POA: Diagnosis not present

## 2015-03-20 DIAGNOSIS — Z1211 Encounter for screening for malignant neoplasm of colon: Secondary | ICD-10-CM

## 2015-03-20 DIAGNOSIS — R1013 Epigastric pain: Secondary | ICD-10-CM | POA: Diagnosis not present

## 2015-03-20 DIAGNOSIS — Z23 Encounter for immunization: Secondary | ICD-10-CM | POA: Diagnosis not present

## 2015-03-20 DIAGNOSIS — N760 Acute vaginitis: Secondary | ICD-10-CM | POA: Diagnosis present

## 2015-03-20 LAB — COMPLETE METABOLIC PANEL WITH GFR
ALT: 16 U/L (ref 6–29)
AST: 17 U/L (ref 10–35)
Albumin: 4.1 g/dL (ref 3.6–5.1)
Alkaline Phosphatase: 85 U/L (ref 33–115)
BUN: 15 mg/dL (ref 7–25)
CO2: 29 mmol/L (ref 20–31)
Calcium: 9.3 mg/dL (ref 8.6–10.2)
Chloride: 105 mmol/L (ref 98–110)
Creat: 0.64 mg/dL (ref 0.50–1.10)
GFR, Est African American: >89 mL/min (ref >=60)
GFR, Est Non African American: >89 mL/min (ref >=60)
Glucose, Bld: 84 mg/dL (ref 65–99)
Potassium: 4.5 mmol/L (ref 3.5–5.3)
Sodium: 138 mmol/L (ref 135–146)
Total Bilirubin: 0.3 mg/dL (ref 0.2–1.2)
Total Protein: 6.6 g/dL (ref 6.1–8.1)

## 2015-03-20 LAB — POCT URINALYSIS DIPSTICK
Bilirubin, UA: NEGATIVE
Blood, UA: NEGATIVE
Glucose, UA: NEGATIVE
Ketones, UA: NEGATIVE
Leukocytes, UA: NEGATIVE
Nitrite, UA: NEGATIVE
Protein, UA: NEGATIVE
Spec Grav, UA: 1.01
Urobilinogen, UA: 0.2
pH, UA: 7

## 2015-03-20 LAB — LIPASE: Lipase: 26 U/L (ref 7–60)

## 2015-03-20 LAB — POC HEMOCCULT BLD/STL (OFFICE/1-CARD/DIAGNOSTIC): Fecal Occult Blood, POC: NEGATIVE

## 2015-03-20 LAB — AMYLASE: Amylase: 44 U/L (ref 0–105)

## 2015-03-20 MED ORDER — FLUCONAZOLE 150 MG PO TABS: ORAL_TABLET | ORAL | Status: DC

## 2015-03-20 MED ORDER — RANITIDINE HCL 150 MG PO CAPS: 150.0000 mg | ORAL_CAPSULE | Freq: Two times a day (BID) | ORAL | Status: DC

## 2015-03-20 MED ORDER — HYDROCODONE-ACETAMINOPHEN 5-325 MG PO TABS: ORAL_TABLET | ORAL | Status: DC

## 2015-03-20 MED ORDER — OMEPRAZOLE 40 MG PO CPDR: DELAYED_RELEASE_CAPSULE | ORAL | Status: DC

## 2015-03-20 NOTE — Assessment & Plan Note (Addendum)
2 day h/o severe epigastric pain, disabling, breath test, lab data to t/o acute pancreatitis and assess metabolic panel. Has pulsatile aorta, needs [place] also and will refer to GI for eval, may need upper endo Will double PPI dose and add zantac

## 2015-03-20 NOTE — Patient Instructions (Addendum)
F/u in 2.5 month, call if you need me before  Labs today, breath test, amylase, lipase and cmp and EGFr, stat Urine is normal, and no  Blood in stool  I will refer you to Dr Laural Golden to investigate upper and lower abdominal pain, also for an abdominal US to evaluate new severe abdominal pain    Increase in dose of omeprazole to 40 mg one twice daily for 1 month, and add zantac 150 mg one twice daily for 1 month  Back pain is from arthritis , limited amount of hydrocodone is prescribed, if persists you may need pain specialist for epiduaral injection Take tylenol ES one twice daily also please Fluconazole 1 tablet sent for vaginal itch

## 2015-03-20 NOTE — Progress Notes (Signed)
   Subjective:    Patient ID: Kristy Travis, female    DOB: February 01, 1966, 49 y.o.   MRN: [id number]  HPI 2 day h/o severe upper abdominal pain "like when I had pancreatitis". Also experiencing lower abdominal pain and tenderness. No one else affected. No fever or chills. No change in BM or visible blood in stool C/o back pain , concerned about kidney stone , worried about possible cause and UTI C/o vaginal itching and stinging, has a total hysterectomy Pleas Koch out of town on vacation in am and worried about ability to go Reports adequate response to aderrall as far as her chronic fatigue is concerned Review of Systems See HPI Denies recent fever or chills. Denies sinus pressure, nasal congestion, ear pain or sore throat. Denies chest congestion, productive cough or wheezing. Denies chest pains, palpitations and leg swelling Denies headaches, seizures, numbness, or tingling. Denies depression, does have anxiety and mild  insomnia. Denies skin break down or rash.        Objective:   Physical Exam  BP 124/84 mmHg  Pulse 70  Resp 16  Ht  (1.651 m)  Wt 191 lb 6.4 oz (86.818 kg)  BMI 31.85 kg/m2  SpO2 100% Patient alert and oriented and in no cardiopulmonary distress.Pt balled over in pain holding abdomen  HEENT: No facial asymmetry, EOMI,   oropharynx pink and moist.  Neck supple no JVD, no mass.  Chest: Clear to auscultation bilaterally.  CVS: S1, S2 no murmurs, no S3.Regular rate.  ABD: Soft , tender in epigastrium an also in both lower quadrants, no guarding or rebound, suprapubic tenderness, no renal angle tenderness Pulsatile epigastric mass Rectal; no mass heme negative stool   Pelvic: No uterus, cervix or adnexal mass. N ulceration of external or internal genitalia noted. Tender in right adnexa Physiologic discharge, not malodorous  Ext: No edema  MS: Decreased  ROM lumbar  spine, adequate in shoulders, hips and knees.Tender over lumbosacral spine  and sacroiliac joint  Skin: Intact, no ulcerations or rash noted.  Psych: Good eye contact, normal affect. Memory intact  Anxious not  depressed appearing.  CNS: CN 2-12 intact, power,  normal throughout.no focal deficits noted.       Assessment & Plan:  Abdominal pain, epigastric 2 day h/o severe epigastric pain, disabling, breath test, lab data to t/o acute pancreatitis and assess metabolic panel. Has pulsatile aorta, needs [place] also and will refer to GI for eval, may need upper endo Will double PPI dose and add zantac  Abdominal pain Generalized abdominal pain with tenderness in lower abdominal quadrant left greater than right, may need lower endo/ colonoscopy, GI to further eval  BACK PAIN, LUMBAR Acute flare on exam pain in back is musculoskeletal,normal UA and exam is positive for tenderness in L/S region and S/I area, limited / short course of hydrocodone prescribed, has established back disease  Chronic fatigue Improved on aderall, continue same dose  Vaginitis and vulvovaginitis Specimens sent for testing based on complaint, exam is within normal, low suspicion for vaginal infection  Cystitis Symptomatic however UA is entirely nornmal  Need for prophylactic vaccination and inoculation against influenza After obtaining informed consent, the vaccine is  administered by LPN.

## 2015-03-21 ENCOUNTER — Ambulatory Visit (HOSPITAL_COMMUNITY): Admission: RE | Admit: 2015-03-21 | Payer: 59 | Source: Ambulatory Visit

## 2015-03-21 ENCOUNTER — Telehealth: Payer: Self-pay | Admitting: *Deleted

## 2015-03-21 LAB — H. PYLORI BREATH TEST: H. pylori Breath Test: NOT DETECTED

## 2015-03-21 LAB — CERVICOVAGINAL ANCILLARY ONLY
Chlamydia: NEGATIVE
Neisseria Gonorrhea: NEGATIVE

## 2015-03-21 NOTE — Telephone Encounter (Signed)
I called patient and gave her the number to [facility] [phone], I told her to schedule her ultrasound for a time and day that will work best for her, pt stated she will call and schedule it.

## 2015-03-23 ENCOUNTER — Other Ambulatory Visit: Payer: Self-pay | Admitting: Family Medicine

## 2015-03-23 DIAGNOSIS — Z23 Encounter for immunization: Secondary | ICD-10-CM | POA: Insufficient documentation

## 2015-03-23 DIAGNOSIS — R1032 Left lower quadrant pain: Secondary | ICD-10-CM

## 2015-03-23 DIAGNOSIS — R1013 Epigastric pain: Secondary | ICD-10-CM

## 2015-03-23 NOTE — Assessment & Plan Note (Signed)
Acute flare on exam pain in back is musculoskeletal,normal UA and exam is positive for tenderness in L/S region and S/I area, limited / short course of hydrocodone prescribed, has established back disease

## 2015-03-23 NOTE — Assessment & Plan Note (Signed)
Improved on aderall, continue same dose

## 2015-03-23 NOTE — Assessment & Plan Note (Signed)
Specimens sent for testing based on complaint, exam is within normal, low suspicion for vaginal infection

## 2015-03-23 NOTE — Assessment & Plan Note (Signed)
After obtaining informed consent, the vaccine is  administered by LPN.  

## 2015-03-23 NOTE — Assessment & Plan Note (Signed)
Generalized abdominal pain with tenderness in lower abdominal quadrant left greater than right, may need lower endo/ colonoscopy, GI to further eval

## 2015-03-23 NOTE — Assessment & Plan Note (Signed)
Symptomatic however UA is entirely nornmal

## 2015-03-24 LAB — CERVICOVAGINAL ANCILLARY ONLY: Wet Prep (BD Affirm): POSITIVE — AB

## 2015-03-25 MED ORDER — FLUCONAZOLE 150 MG PO TABS: 150.0000 mg | ORAL_TABLET | Freq: Once | ORAL | Status: DC

## 2015-03-25 MED ORDER — METRONIDAZOLE 500 MG PO TABS: 500.0000 mg | ORAL_TABLET | Freq: Two times a day (BID) | ORAL | Status: DC

## 2015-03-25 NOTE — Addendum Note (Signed)
Addended by: Abner Greenspan on: 03/25/2015 12:52 PM   Modules accepted: Orders

## 2015-03-31 ENCOUNTER — Other Ambulatory Visit: Payer: Self-pay | Admitting: Family Medicine

## 2015-03-31 DIAGNOSIS — Z1231 Encounter for screening mammogram for malignant neoplasm of breast: Secondary | ICD-10-CM

## 2015-04-01 ENCOUNTER — Encounter (INDEPENDENT_AMBULATORY_CARE_PROVIDER_SITE_OTHER): Payer: Self-pay | Admitting: *Deleted

## 2015-04-01 ENCOUNTER — Other Ambulatory Visit: Payer: Self-pay | Admitting: Family Medicine

## 2015-04-01 ENCOUNTER — Ambulatory Visit
Admission: RE | Admit: 2015-04-01 | Discharge: 2015-04-01 | Disposition: A | Discharge status: Home / Self Care | Payer: 59 | Source: Ambulatory Visit | Attending: Family Medicine | Admitting: Family Medicine

## 2015-04-01 DIAGNOSIS — Z1231 Encounter for screening mammogram for malignant neoplasm of breast: Secondary | ICD-10-CM | POA: Insufficient documentation

## 2015-04-01 DIAGNOSIS — R922 Inconclusive mammogram: Secondary | ICD-10-CM | POA: Diagnosis not present

## 2015-04-04 ENCOUNTER — Other Ambulatory Visit: Payer: Self-pay | Admitting: Family Medicine

## 2015-04-04 DIAGNOSIS — R928 Other abnormal and inconclusive findings on diagnostic imaging of breast: Secondary | ICD-10-CM

## 2015-04-07 ENCOUNTER — Other Ambulatory Visit: Payer: Self-pay | Admitting: Family Medicine

## 2015-04-07 DIAGNOSIS — N6489 Other specified disorders of breast: Secondary | ICD-10-CM

## 2015-04-10 ENCOUNTER — Ambulatory Visit
Admission: RE | Admit: 2015-04-10 | Discharge: 2015-04-10 | Disposition: A | Discharge status: Home / Self Care | Payer: 59 | Source: Ambulatory Visit | Attending: Family Medicine | Admitting: Family Medicine

## 2015-04-10 DIAGNOSIS — N6489 Other specified disorders of breast: Secondary | ICD-10-CM | POA: Diagnosis present

## 2015-04-10 DIAGNOSIS — N63 Unspecified lump in breast: Secondary | ICD-10-CM | POA: Insufficient documentation

## 2015-04-10 DIAGNOSIS — R928 Other abnormal and inconclusive findings on diagnostic imaging of breast: Secondary | ICD-10-CM

## 2015-04-30 ENCOUNTER — Ambulatory Visit (INDEPENDENT_AMBULATORY_CARE_PROVIDER_SITE_OTHER): Payer: 59 | Admitting: Internal Medicine

## 2015-05-05 ENCOUNTER — Encounter (INDEPENDENT_AMBULATORY_CARE_PROVIDER_SITE_OTHER): Payer: Self-pay | Admitting: *Deleted

## 2015-06-17 ENCOUNTER — Emergency Department
Admission: EM | Admit: 2015-06-17 | Discharge: 2015-06-17 | Disposition: A | Discharge status: Home / Self Care | Payer: 59 | Attending: Emergency Medicine | Admitting: Emergency Medicine

## 2015-06-17 ENCOUNTER — Emergency Department: Payer: 59

## 2015-06-17 ENCOUNTER — Encounter: Payer: Self-pay | Admitting: Emergency Medicine

## 2015-06-17 DIAGNOSIS — R112 Nausea with vomiting, unspecified: Secondary | ICD-10-CM | POA: Diagnosis not present

## 2015-06-17 DIAGNOSIS — R1013 Epigastric pain: Secondary | ICD-10-CM | POA: Insufficient documentation

## 2015-06-17 DIAGNOSIS — I10 Essential (primary) hypertension: Secondary | ICD-10-CM | POA: Diagnosis not present

## 2015-06-17 LAB — URINALYSIS COMPLETE WITH MICROSCOPIC (ARMC ONLY)
Bacteria, UA: NONE SEEN
Bilirubin Urine: NEGATIVE
Glucose, UA: NEGATIVE mg/dL
Hgb urine dipstick: NEGATIVE
Leukocytes, UA: NEGATIVE
Nitrite: NEGATIVE
Protein, ur: NEGATIVE mg/dL
Specific Gravity, Urine: 1.018 (ref 1.005–1.030)
pH: 8 (ref 5.0–8.0)

## 2015-06-17 LAB — COMPREHENSIVE METABOLIC PANEL
ALT: 17 U/L (ref 14–54)
AST: 19 U/L (ref 15–41)
Albumin: 4.5 g/dL (ref 3.5–5.0)
Alkaline Phosphatase: 70 U/L (ref 38–126)
Anion gap: 8 (ref 5–15)
BUN: 22 mg/dL — ABNORMAL HIGH (ref 6–20)
CO2: 28 mmol/L (ref 22–32)
Calcium: 9.9 mg/dL (ref 8.9–10.3)
Chloride: 104 mmol/L (ref 101–111)
Creatinine, Ser: 0.7 mg/dL (ref 0.44–1.00)
GFR calc Af Amer: >60 mL/min (ref >60)
GFR calc non Af Amer: >60 mL/min (ref >60)
Glucose, Bld: 98 mg/dL (ref 65–99)
Potassium: 3.8 mmol/L (ref 3.5–5.1)
Sodium: 140 mmol/L (ref 135–145)
Total Bilirubin: 0.5 mg/dL (ref 0.3–1.2)
Total Protein: 8.1 g/dL (ref 6.5–8.1)

## 2015-06-17 LAB — CBC
HCT: 39.5 % (ref 35.0–47.0)
Hemoglobin: 12.6 g/dL (ref 12.0–16.0)
MCH: 25.6 pg — ABNORMAL LOW (ref 26.0–34.0)
MCHC: 31.8 g/dL — ABNORMAL LOW (ref 32.0–36.0)
MCV: 80.5 fL (ref 80.0–100.0)
Platelets: 259 10*3/uL (ref 150–440)
RBC: 4.91 MIL/uL (ref 3.80–5.20)
RDW: 14 % (ref 11.5–14.5)
WBC: 7.3 10*3/uL (ref 3.6–11.0)

## 2015-06-17 LAB — LIPASE, BLOOD: Lipase: 26 U/L (ref 11–51)

## 2015-06-17 LAB — TROPONIN I: Troponin I: <0.03 ng/mL (ref <0.031)

## 2015-06-17 MED ORDER — OXYCODONE-ACETAMINOPHEN 5-325 MG PO TABS
1.0000 | ORAL_TABLET | Freq: Once | ORAL | Status: AC
  Administered 2015-06-17: 1 via ORAL

## 2015-06-17 MED ORDER — HYDROMORPHONE HCL 1 MG/ML IJ SOLN
INTRAMUSCULAR | Status: AC
  Administered 2015-06-17: 1 mg via INTRAVENOUS
  Filled 2015-06-17: qty 1

## 2015-06-17 MED ORDER — ONDANSETRON 4 MG PO TBDP
4.0000 mg | ORAL_TABLET | Freq: Once | ORAL | Status: AC
  Administered 2015-06-17: 4 mg via ORAL

## 2015-06-17 MED ORDER — OXYCODONE-ACETAMINOPHEN 5-325 MG PO TABS: 2.0000 | ORAL_TABLET | Freq: Four times a day (QID) | ORAL | Status: DC | PRN

## 2015-06-17 MED ORDER — ONDANSETRON HCL 4 MG/2ML IJ SOLN
4.0000 mg | Freq: Once | INTRAMUSCULAR | Status: AC
  Administered 2015-06-17: 4 mg via INTRAVENOUS
  Filled 2015-06-17: qty 2

## 2015-06-17 MED ORDER — HYDROMORPHONE HCL 1 MG/ML IJ SOLN
1.0000 mg | Freq: Once | INTRAMUSCULAR | Status: AC
  Administered 2015-06-17: 1 mg via INTRAVENOUS

## 2015-06-17 MED ORDER — ONDANSETRON 4 MG PO TBDP
ORAL_TABLET | ORAL | Status: AC
  Filled 2015-06-17: qty 1

## 2015-06-17 MED ORDER — GI COCKTAIL ~~LOC~~
30.0000 mL | Freq: Once | ORAL | Status: AC
  Administered 2015-06-17: 30 mL via ORAL
  Filled 2015-06-17: qty 30

## 2015-06-17 MED ORDER — OXYCODONE-ACETAMINOPHEN 5-325 MG PO TABS
ORAL_TABLET | ORAL | Status: AC
  Administered 2015-06-17: 1 via ORAL
  Filled 2015-06-17: qty 1

## 2015-06-17 MED ORDER — FENTANYL CITRATE (PF) 100 MCG/2ML IJ SOLN
50.0000 ug | Freq: Once | INTRAMUSCULAR | Status: AC
  Administered 2015-06-17: 50 ug via INTRAVENOUS
  Filled 2015-06-17: qty 2

## 2015-06-17 MED ORDER — ONDANSETRON HCL 4 MG PO TABS: 4.0000 mg | ORAL_TABLET | Freq: Every day | ORAL | Status: DC | PRN

## 2015-06-17 NOTE — ED Notes (Signed)
AAOx3.  Skin warm and dry.  Resting.

## 2015-06-17 NOTE — Discharge Instructions (Signed)
Nausea and Vomiting Nausea is a sick feeling that often comes before throwing up (vomiting). Vomiting is a reflex where stomach contents come out of your mouth. Vomiting can cause severe loss of body fluids (dehydration). Children and elderly adults can become dehydrated quickly, especially if they also have diarrhea. Nausea and vomiting are symptoms of a condition or disease. It is important to find the cause of your symptoms. CAUSES   Direct irritation of the stomach lining. This irritation can result from increased acid production (gastroesophageal reflux disease), infection, food poisoning, taking certain medicines (such as nonsteroidal anti-inflammatory drugs), alcohol use, or tobacco use.  Signals from the brain.These signals could be caused by a headache, heat exposure, an inner ear disturbance, increased pressure in the brain from injury, infection, a tumor, or a concussion, pain, emotional stimulus, or metabolic problems.  An obstruction in the gastrointestinal tract (bowel obstruction).  Illnesses such as diabetes, hepatitis, gallbladder problems, appendicitis, kidney problems, cancer, sepsis, atypical symptoms of a heart attack, or eating disorders.  Medical treatments such as chemotherapy and radiation.  Receiving medicine that makes you sleep (general anesthetic) during surgery. DIAGNOSIS Your caregiver may ask for tests to be done if the problems do not improve after a few days. Tests may also be done if symptoms are severe or if the reason for the nausea and vomiting is not clear. Tests may include:  Urine tests.  Blood tests.  Stool tests.  Cultures (to look for evidence of infection).  X-rays or other imaging studies. Test results can help your caregiver make decisions about treatment or the need for additional tests. TREATMENT You need to stay well hydrated. Drink frequently but in small amounts.You may wish to drink water, sports drinks, clear broth, or eat frozen  ice pops or gelatin dessert to help stay hydrated.When you eat, eating slowly may help prevent nausea.There are also some antinausea medicines that may help prevent nausea. HOME CARE INSTRUCTIONS   Take all medicine as directed by your caregiver.  If you do not have an appetite, do not force yourself to eat. However, you must continue to drink fluids.  If you have an appetite, eat a normal diet unless your caregiver tells you differently.  Eat a variety of complex carbohydrates (rice, wheat, potatoes, bread), lean meats, yogurt, fruits, and vegetables.  Avoid high-fat foods because they are more difficult to digest.  Drink enough water and fluids to keep your urine clear or pale yellow.  If you are dehydrated, ask your caregiver for specific rehydration instructions. Signs of dehydration may include:  Severe thirst.  Dry lips and mouth.  Dizziness.  Dark urine.  Decreasing urine frequency and amount.  Confusion.  Rapid breathing or pulse. SEEK IMMEDIATE MEDICAL CARE IF:   You have blood or brown flecks (like coffee grounds) in your vomit.  You have black or bloody stools.  You have a severe headache or stiff neck.  You are confused.  You have severe abdominal pain.  You have chest pain or trouble breathing.  You do not urinate at least once every 8 hours.  You develop cold or clammy skin.  You continue to vomit for longer than 24 to 48 hours.  You have a fever. MAKE SURE YOU:   Understand these instructions.  Will watch your condition.  Will get help right away if you are not doing well or get worse.   This information is not intended to replace advice given to you by your health care provider. Make sure  you discuss any questions you have with your health care provider.   Document Released: 06/28/2005 Document Revised: 09/20/2011 Document Reviewed: 11/25/2010 Elsevier Interactive Patient Education 2016 [employer] Heartburn Heartburn is a type of  pain or discomfort that can happen in the throat or chest. It is often described as a burning pain. It may also cause a bad taste in the mouth. Heartburn may feel worse when you lie down or bend over, and it is often worse at night. Heartburn may be caused by stomach contents that move back up into the esophagus (reflux). HOME CARE INSTRUCTIONS Take these actions to decrease your discomfort and to help avoid complications. Diet  Follow a diet as recommended by your health care provider. This may involve avoiding foods and drinks such as:  Coffee and tea (with or without caffeine).  Drinks that contain alcohol.  Energy drinks and sports drinks.  Carbonated drinks or sodas.  Chocolate and cocoa.  Peppermint and mint flavorings.  Garlic and onions.  Horseradish.  Spicy and acidic foods, including peppers, chili powder, curry powder, vinegar, hot sauces, and barbecue sauce.  Citrus fruit juices and citrus fruits, such as oranges, lemons, and limes.  Tomato-based foods, such as red sauce, chili, salsa, and pizza with red sauce.  Fried and fatty foods, such as donuts, french fries, potato chips, and high-fat dressings.  High-fat meats, such as hot dogs and fatty cuts of red and white meats, such as rib eye steak, sausage, ham, and bacon.  High-fat dairy items, such as whole milk, butter, and cream cheese.  Eat small, frequent meals instead of large meals.  Avoid drinking large amounts of liquid with your meals.  Avoid eating meals during the 2-3 hours before bedtime.  Avoid lying down right after you eat.  Do not exercise right after you eat. General Instructions  Pay attention to any changes in your symptoms.  Take over-the-counter and prescription medicines only as told by your health care provider. Do not take aspirin, ibuprofen, or other NSAIDs unless your health care provider told you to do so.  Do not use any tobacco products, including cigarettes, chewing tobacco,  and e-cigarettes. If you need help quitting, ask your health care provider.  Wear loose-fitting clothing. Do not wear anything tight around your waist that causes pressure on your abdomen.  Raise (elevate) the head of your bed about 6 inches (15 cm).  Try to reduce your stress, such as with yoga or meditation. If you need help reducing stress, ask your health care provider.  If you are overweight, reduce your weight to an amount that is healthy for you. Ask your health care provider for guidance about a safe weight loss goal.  Keep all follow-up visits as told by your health care provider. This is important. SEEK MEDICAL CARE IF:  You have new symptoms.  You have unexplained weight loss.  You have difficulty swallowing, or it hurts to swallow.  You have wheezing or a persistent cough.  Your symptoms do not improve with treatment.  You have frequent heartburn for more than two weeks. SEEK IMMEDIATE MEDICAL CARE IF:  You have pain in your arms, neck, jaw, teeth, or back.  You feel sweaty, dizzy, or light-headed.  You have chest pain or shortness of breath.  You vomit and your vomit looks like blood or coffee grounds.  Your stool is bloody or black.   This information is not intended to replace advice given to you by your health care provider. Make  sure you discuss any questions you have with your health care provider.   Document Released: 11/14/2008 Document Revised: 03/19/2015 Document Reviewed: 10/23/2014 Elsevier Interactive Patient Education [employer]

## 2015-06-17 NOTE — ED Notes (Signed)
AAOx3.  Skin warm and dry.  NAD.  D/C home 

## 2015-06-17 NOTE — ED Provider Notes (Signed)
[facility] Emergency Department Provider Note     Time seen: ----------------------------------------- 2:35 PM on 06/17/2015 -----------------------------------------    I have reviewed the triage vital signs and the nursing notes.   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 Patient states that a history of this and was diagnosed with pancreatitis that was thought to be due to her gallbladder, in the past they did take her gallbladder out. She did have nausea with vomiting once. No nausea present, she got fentanyl prior to my evaluation which improved her symptoms. She had a burning epigastric pain and a for self Maalox made her worse. Past Medical History  Diagnosis Date  . Carpal tunnel syndrome   . Arthritis   . Chronic fatigue   . Hypertension   . Obesity   . Pancreatitis 07/2014    had  gall bladder removed , spent 1 week in hospital    Patient Active Problem List   Diagnosis Date Noted  . Need for prophylactic vaccination and inoculation against influenza 03/23/2015  . Abdominal pain, epigastric 03/20/2015  . Abdominal pain 03/20/2015  . Vaginitis and vulvovaginitis 03/20/2015  . Cystitis 03/17/2015  . Need for Tdap vaccination 03/17/2015  . Right flank pain 02/18/2015  . Chronic fatigue 02/18/2015  . Chronic fatigue and malaise 10/30/2013  . Annual physical exam 12/30/2012  . Hypersomnia with sleep apnea, unspecified 12/20/2012  . Nonspecific abnormal electrocardiogram (ECG) (EKG) 12/20/2012  . Dyspepsia 08/21/2012  . HTN (hypertension) 04/04/2012  . Seasonal allergies 04/04/2012  . Vitamin D deficiency 08/27/2011  . GOITER 09/02/2010  . PAIN IN JOINT, MULTIPLE SITES 09/02/2010  . Metabolic syndrome X 09/02/2010  . Hyperlipidemia 02/06/2010  . BACK PAIN, LUMBAR 01/15/2008  . Obesity 12/21/2007    Past Surgical  History  Procedure Laterality Date  . Laparoscopy to eval dyspepsia  2004  . Dilation and curettage of uterus    . Cyst removed from left foot and bone removed from left foot  April and June 2010  . Abdominal hysterectomy  06/2014    tah and BSO, vaginal, excess blleeding  . Cholecystectomy  07/2014  . Bariatric surgery N/A 09/17/2013    sleeve    Allergies Maxzide  Social History Social History  Substance Use Topics  . Smoking status: Never Smoker   . Smokeless tobacco: None  . Alcohol Use: No    Review of Systems Constitutional: Negative for fever. Eyes: Negative for visual changes. ENT: Negative for sore throat. Cardiovascular: Negative for chest pain. Respiratory: Negative for shortness of breath. Gastrointestinal: Positive for abdominal pain and vomiting. Genitourinary: Negative for dysuria. Musculoskeletal: Negative for back pain. Skin: Negative for rash. Neurological: Negative for headaches, focal weakness or numbness.  10-point ROS otherwise negative.  ____________________________________________   PHYSICAL EXAM:  VITAL SIGNS: ED Triage Vitals  Enc Vitals Group     BP 06/17/15 1352 148/96 mmHg     Pulse Rate 06/17/15 1352 81     Resp 06/17/15 1352 18     Temp 06/17/15 1352 97.6 F (36.4 C)     Temp Source 06/17/15 1352 Oral     SpO2 06/17/15 1352 100 %     Weight 06/17/15 1352 190 lb (86.183 kg)     Height 06/17/15 1352  (1.651 m)     Head Cir --      Peak Flow --  Pain Score 06/17/15 1352 6     Pain Loc --      Pain Edu? --      Excl. in GC? --     Constitutional: Alert and oriented. Mild distress. Eyes: Conjunctivae are normal. PERRL. Normal extraocular movements. ENT   Head: Normocephalic and atraumatic.   Nose: No congestion/rhinnorhea.   Mouth/Throat: Mucous membranes are moist.   Neck: No stridor. Cardiovascular: Normal rate, regular rhythm. Normal and symmetric distal pulses are present in all extremities. No  murmurs, rubs, or gallops. Respiratory: Normal respiratory effort without tachypnea nor retractions. Breath sounds are clear and equal bilaterally. No wheezes/rales/rhonchi. Gastrointestinal: Epigastric tenderness, no rebound or guarding. Normal bowel sounds. Musculoskeletal: Nontender with normal range of motion in all extremities. No joint effusions.  No lower extremity tenderness nor edema. Neurologic:  Normal speech and language. No gross focal neurologic deficits are appreciated. Speech is normal.  Skin:  Skin is warm, dry and intact. No rash noted. Psychiatric: Mood and affect are normal. Speech and behavior are normal. Patient exhibits appropriate insight and judgment. ____________________________________________  EKG: Interpreted by me. Normal sinus rhythm with a rate of 70 bpm, normal PR interval, normal QS with, normal QT interval.  ____________________________________________  ED COURSE:  Pertinent labs & imaging results that were available during my care of the patient were reviewed by me and considered in my medical decision making (see chart for details). Patient's no acute distress, unclear epigastric discomfort. She'll receive GI cocktail and basic labs. ____________________________________________    LABS (pertinent positives/negatives)  Labs Reviewed  COMPREHENSIVE METABOLIC PANEL - Abnormal; Notable for the following:    BUN 22 (*)    All other components within normal limits  CBC - Abnormal; Notable for the following:    MCH 25.6 (*)    MCHC 31.8 (*)    All other components within normal limits  LIPASE, BLOOD  TROPONIN I    RADIOLOGY  Abdomen 2 view Is pending at this time ____________________________________________  FINAL ASSESSMENT AND PLAN  Epigastric pain and vomiting  Plan: Patient with labs and imaging as dictated above. Patient's labs are reassuring, her pain seems to be under control currently. Pain seems to be only in the epigastrium.  Urinalysis is still pending at this time as well.   Emily FilbertWilliams, Jonathan E, MD   Emily FilbertJonathan E Williams, MD 06/17/15 [phone]

## 2015-06-17 NOTE — ED Notes (Signed)
Pt c/o epigastric pain for 2 hours; history of pancreatitis and says this feels like a flare-up; nausea with vomiting once-no nausea at present;

## 2015-08-07 ENCOUNTER — Encounter: Payer: Self-pay | Admitting: Obstetrics and Gynecology

## 2015-08-12 ENCOUNTER — Ambulatory Visit (INDEPENDENT_AMBULATORY_CARE_PROVIDER_SITE_OTHER): Payer: 59 | Admitting: Obstetrics and Gynecology

## 2015-08-12 ENCOUNTER — Encounter: Payer: Self-pay | Admitting: Obstetrics and Gynecology

## 2015-08-12 VITALS — BP 145/85 | HR 83 | Ht 65.0 in | Wt 191.2 lb

## 2015-08-12 DIAGNOSIS — Z01419 Encounter for gynecological examination (general) (routine) without abnormal findings: Secondary | ICD-10-CM | POA: Diagnosis not present

## 2015-08-12 DIAGNOSIS — N760 Acute vaginitis: Secondary | ICD-10-CM | POA: Diagnosis not present

## 2015-08-12 DIAGNOSIS — E66811 Obesity, class 1: Secondary | ICD-10-CM

## 2015-08-12 DIAGNOSIS — B9689 Other specified bacterial agents as the cause of diseases classified elsewhere: Secondary | ICD-10-CM

## 2015-08-12 DIAGNOSIS — A499 Bacterial infection, unspecified: Secondary | ICD-10-CM | POA: Diagnosis not present

## 2015-08-12 DIAGNOSIS — E669 Obesity, unspecified: Secondary | ICD-10-CM | POA: Diagnosis not present

## 2015-08-12 DIAGNOSIS — N951 Menopausal and female climacteric states: Secondary | ICD-10-CM | POA: Diagnosis not present

## 2015-08-12 DIAGNOSIS — R928 Other abnormal and inconclusive findings on diagnostic imaging of breast: Secondary | ICD-10-CM | POA: Diagnosis not present

## 2015-08-12 MED ORDER — METRONIDAZOLE 500 MG PO TABS: 500.0000 mg | ORAL_TABLET | Freq: Two times a day (BID) | ORAL | Status: DC

## 2015-08-12 MED ORDER — CLONIDINE HCL 0.1 MG PO TABS: 0.1000 mg | ORAL_TABLET | Freq: Two times a day (BID) | ORAL | Status: DC

## 2015-08-12 NOTE — Progress Notes (Signed)
ANNUAL PREVENTATIVE CARE GYN  ENCOUNTER NOTE  Subjective:       Kristy Travis is a 50 y.o. [phone] female here for a routine annual gynecologic exam.  The patient wears seatbelts.  She has never had a blood transfusion.  The patient does not engage in regular exercise.  She is curently sexually active.  Current complaints: 1.  Vasomotor symptoms.  Was previously taking Premarin HRT, however discontinued in September after abnormal mammogram, although results show likely benign right breast mass.  2. Vaginal discharge with odor x 2 weeks. Worse after intercourse. Denies itching/burning.   Gynecologic History Patient's last menstrual period was 06/24/2014. Contraception: status post hysterectomy Last Pap: 11/2012. Results were: normal.  Denies h/o abnormal pap smears.  Last mammogram: 04/01/2015. Results were: abnormal (assymetries of right breast, BIRADS-0; diagnostic mammogram performed 04/10/15 with likely benign nodular densities within the upper-outer right breast with a 2 mm hypoechoic mass at the 11 o'clock position identified sonographically likely corresponding to 1 of these nodules. Six-month followup is recommended to ensure stability.)  Obstetric History OB History  Gravida Para Term Preterm AB SAB TAB Ectopic Multiple Living  # Outcome Date GA Lbr Len/2nd Weight Sex Delivery Anes PTL Lv  3 SAB           2 Term      Vag-Spont     1 Term      Vag-Spont         Past Medical History  Diagnosis Date  . Carpal tunnel syndrome   . Arthritis   . Chronic fatigue   . Hypertension   . Obesity   . Pancreatitis 07/2014    had  gall bladder removed , spent 1 week in hospital    Past Surgical History  Procedure Laterality Date  . Laparoscopy to eval dyspepsia  2004  . Dilation and curettage of uterus    . Cyst removed from left foot and bone removed from left foot  April and June 2010  . Cholecystectomy  07/2014  . Bariatric surgery N/A 09/17/2013     sleeve  . Total vaginal hysterectomy  06/2014    with BSO    Current Outpatient Prescriptions on File Prior to Visit  Medication Sig Dispense Refill  . amphetamine-dextroamphetamine (ADDERALL) 10 MG tablet Take 1 tablet (10 mg total) by mouth daily with breakfast. (Patient not taking: Reported on 08/12/2015) 30 tablet 0   No current facility-administered medications on file prior to visit.    Allergies  Allergen Reactions  . Maxzide [Triamterene-Hctz] Nausea Only    Social History   Social History  . Marital Status: Married    Spouse Name: N/A  . Number of Children: N/A  . Years of Education: N/A   Occupational History  . Not on file.   Social History Main Topics  . Smoking status: Never Smoker   . Smokeless tobacco: Not on file  . Alcohol Use: No  . Drug Use: No  . Sexual Activity: Yes    Birth Control/ Protection: Surgical   Other Topics Concern  . Not on file   Social History Narrative    Family History  Problem Relation Age of Onset  . Thrombosis Mother   . Diabetes Father   . Pulmonary Hypertension Father     The following portions of the patient's history were reviewed and updated as appropriate: allergies, current medications, past family history, past medical  history, past social history, past surgical history and problem list.  Review of Systems ROS Review of Systems - General ROS: negative for - chills, fatigue, fever, hot flashes, night sweats, weight gain or weight loss Psychological ROS: negative for - anxiety, decreased libido, depression, mood swings, physical abuse or sexual abuse Ophthalmic ROS: negative for - blurry vision, eye pain or loss of vision ENT ROS: negative for - headaches, hearing change, visual changes or vocal changes Allergy and Immunology ROS: negative for - hives, itchy/watery eyes or seasonal allergies Hematological and Lymphatic ROS: negative for - bleeding problems, bruising, swollen lymph nodes or weight  loss Endocrine ROS: positive for  hot flashes; negative for - galactorrhea, hair pattern changes, malaise/lethargy, mood swings, palpitations, polydipsia/polyuria, skin changes, temperature intolerance or unexpected weight changes Breast ROS: negative for - new or changing breast lumps or nipple discharge Respiratory ROS: negative for - cough or shortness of breath Cardiovascular ROS: negative for - chest pain, irregular heartbeat, palpitations or shortness of breath Gastrointestinal ROS: no abdominal pain, change in bowel habits, or black or bloody stools Genito-Urinary ROS: positive for vaginal discharge; negative for dysuria, trouble voiding, or hematuria Musculoskeletal ROS: negative for - joint pain or joint stiffness Neurological ROS: negative for - bowel and bladder control changes Dermatological ROS: negative for rash and skin lesion changes   Objective:   BP 145/85 mmHg  Pulse 83  Ht  (1.651 m)  Wt 191 lb 3.2 oz (86.728 kg)  BMI 31.82 kg/m2  LMP 06/24/2014   CONSTITUTIONAL: Well-developed, well-nourished female in no acute distress.  PSYCHIATRIC: Normal mood and affect. Normal behavior. Normal judgment and thought content. NEUROLGIC: Alert and oriented to person, place, and time. Normal muscle tone coordination. No cranial nerve deficit noted. HENT:  Normocephalic, atraumatic, External right and left ear normal. Oropharynx is clear and moist EYES: Conjunctivae and EOM are normal. Pupils are equal, round, and reactive to light. No scleral icterus.  NECK: Normal range of motion, supple, no masses.  Normal thyroid.  SKIN: Skin is warm and dry. No rash noted. Not diaphoretic. No erythema. No pallor. CARDIOVASCULAR: Normal heart rate noted, regular rhythm, no murmur. RESPIRATORY: Clear to auscultation bilaterally. Effort and breath sounds normal, no problems with respiration noted. BREASTS: Symmetric in size. No masses, skin changes, nipple drainage, or  lymphadenopathy. ABDOMEN: Soft, normal bowel sounds, no distention noted.  No tenderness, rebound or guarding.  BLADDER: Normal PELVIC:  Bladder no bladder distension noted  Urethra: normal appearing urethra with no masses, tenderness or lesions  Vulva: normal appearing vulva with no masses, tenderness or lesions  Vagina: normal appearing vagina with normal color, no lesions.  Small amount of thin white discharge, malodorous  Cervix: surgically absent  Uterus: not indicated and surgically absent, vaginal cuff well healed  Adnexa: surgically absent bilateral  Rectal: not indicated MUSCULOSKELETAL: Normal range of motion. No tenderness.  No cyanosis, clubbing, or edema.  2+ distal pulses. LYMPHATIC: No Axillary, Supraclavicular, or Inguinal Adenopathy.   Microscopic wet-mount exam shows clue cells, no WBCs, trichomonads, or yeast.  Normal epithelial cells.  KOH done.    Labs:  Lab Results  Component Value Date   WBC 7.3 06/17/2015   HGB 12.6 06/17/2015   HCT 39.5 06/17/2015   MCV 80.5 06/17/2015   PLT 259 06/17/2015    Lab Results  Component Value Date   CREATININE 0.70 06/17/2015   BUN 22* 06/17/2015   NA 140 06/17/2015   K 3.8 06/17/2015   CL 104 06/17/2015  CO2 28 06/17/2015   Lab Results  Component Value Date   ALT 17 06/17/2015   AST 19 06/17/2015   ALKPHOS 70 06/17/2015   BILITOT 0.5 06/17/2015    Lab Results  Component Value Date   TSH 1.358 02/18/2015    Lab Results  Component Value Date   CHOL 224* 02/18/2015   HDL 74 02/18/2015   LDLCALC 128 02/18/2015   TRIG 109 02/18/2015   CHOLHDL 3.0 02/18/2015   Lab Results  Component Value Date   HGBA1C 6.2 08/16/2013    Assessment:   Annual gynecologic examination 50 y.o. Contraception: status post hysterectomy Obesity 1  Vasomotor symptoms Bacterial vaginosis BIRADS-3 right breast mammogram  Plan:   Pap: Not needed.  Patient s/p hysterectomy with no h/o abnormal pap smears in the past.   Mammogram: repeat scheduled for March 2017.  Stool Guaiac Testing:  Not Indicated.   To be scheduled for colonoscopy.  Labs: previously performed.   Routine preventative health maintenance measures emphasized: Exercise/Diet/Weight control, Alcohol/Substance use risks and Stress Management Declines STI testing.  Patient with bothersome menopausal vasomotor symptoms. Discussed lifestyle interventions such as wearing light clothing, remaining in cool environments, having fan/air conditioner in the room, avoiding hot beverages etc.  Discussed using hormone therapy and concerns about increased risk of heart disease, cerebrovascular disease, thromboembolic disease,  and breast cancer.  Also discussed other medical options such as Clonidine, Paxil, Effexor or Neurontin.  Patient declines further hormonal therapy and inquires about non-hormonal management.  Patient opted for Clonidine therapy for now, but if this does not work, then desires to try Brisdelle. She will return in 6 weeks for reevaluation. Will treat Bacterial Vaginosis with Flagyl 500 mg BID x 7 days.   Return to Clinic - 1 Year for annual exam.    Hildred Laser, MD [facility]

## 2015-08-12 NOTE — Patient Instructions (Signed)

## 2015-08-13 ENCOUNTER — Telehealth: Payer: Self-pay | Admitting: Obstetrics and Gynecology

## 2015-08-13 ENCOUNTER — Encounter: Payer: Self-pay | Admitting: Obstetrics and Gynecology

## 2015-08-13 DIAGNOSIS — B379 Candidiasis, unspecified: Secondary | ICD-10-CM

## 2015-08-13 MED ORDER — FLUCONAZOLE 150 MG PO TABS: 150.0000 mg | ORAL_TABLET | Freq: Once | ORAL | Status: DC

## 2015-08-13 NOTE — Telephone Encounter (Signed)
RX sent in, please inform patient. Thanks.

## 2015-08-13 NOTE — Telephone Encounter (Signed)
Pt prescribed antibiotic and forgot to ask for a pill for yeast .  She always gets a yeast inf when on antibiotics

## 2015-08-26 DIAGNOSIS — H524 Presbyopia: Secondary | ICD-10-CM | POA: Diagnosis not present

## 2015-09-03 ENCOUNTER — Telehealth: Payer: Self-pay | Admitting: Family Medicine

## 2015-09-03 DIAGNOSIS — R928 Other abnormal and inconclusive findings on diagnostic imaging of breast: Secondary | ICD-10-CM

## 2015-09-03 NOTE — Telephone Encounter (Signed)
Kristy Travis is calling stating that Diagnostic Mammo orders are supposed to be put in or sent to [facility] for her to have her repeat mammo, please advise?

## 2015-09-04 NOTE — Telephone Encounter (Signed)
Orders entered

## 2015-09-23 ENCOUNTER — Ambulatory Visit: Payer: Self-pay | Admitting: Physician Assistant

## 2015-09-23 ENCOUNTER — Ambulatory Visit (INDEPENDENT_AMBULATORY_CARE_PROVIDER_SITE_OTHER): Payer: 59 | Admitting: Obstetrics and Gynecology

## 2015-09-23 ENCOUNTER — Encounter: Payer: Self-pay | Admitting: Obstetrics and Gynecology

## 2015-09-23 ENCOUNTER — Encounter: Payer: Self-pay | Admitting: Physician Assistant

## 2015-09-23 VITALS — BP 122/80 | HR 82 | Temp 98.4°F

## 2015-09-23 VITALS — BP 124/78 | HR 86 | Ht 65.0 in | Wt 196.9 lb

## 2015-09-23 DIAGNOSIS — N952 Postmenopausal atrophic vaginitis: Secondary | ICD-10-CM | POA: Diagnosis not present

## 2015-09-23 DIAGNOSIS — B9689 Other specified bacterial agents as the cause of diseases classified elsewhere: Secondary | ICD-10-CM

## 2015-09-23 DIAGNOSIS — J069 Acute upper respiratory infection, unspecified: Secondary | ICD-10-CM

## 2015-09-23 DIAGNOSIS — A499 Bacterial infection, unspecified: Secondary | ICD-10-CM | POA: Diagnosis not present

## 2015-09-23 DIAGNOSIS — N760 Acute vaginitis: Secondary | ICD-10-CM

## 2015-09-23 DIAGNOSIS — N958 Other specified menopausal and perimenopausal disorders: Secondary | ICD-10-CM | POA: Diagnosis not present

## 2015-09-23 DIAGNOSIS — Z90710 Acquired absence of both cervix and uterus: Secondary | ICD-10-CM

## 2015-09-23 DIAGNOSIS — E8940 Asymptomatic postprocedural ovarian failure: Secondary | ICD-10-CM

## 2015-09-23 MED ORDER — AZITHROMYCIN 250 MG PO TABS: ORAL_TABLET | ORAL | Status: DC

## 2015-09-23 MED ORDER — METRONIDAZOLE 0.75 % VA GEL: 1.0000 | Freq: Every day | VAGINAL | Status: DC

## 2015-09-23 MED ORDER — METRONIDAZOLE 500 MG PO TABS: 500.0000 mg | ORAL_TABLET | ORAL | Status: DC | PRN

## 2015-09-23 NOTE — Progress Notes (Signed)
   GYNECOLOGY CLINIC PROGRESS NOTE  Subjective:     Kristy Travis is a 50 y.o. [phone] female who presents for follow up of menopausal vasomotor symptoms.  She was placed on Clonidine ~ 6 weeks ago for hot flushes and night sweats.  Notes that the medication is working well to control symptoms.   Patient also desires evaluation of an abnormal vaginal discharge and vaginal itching. Symptoms have been present for several weeks. Vaginal symptoms: discharge described as white, malodorous and thin, local irritation and odor.  Patient is s/p hysterectomy last year. She denies abnormal bleeding, lesions, pain and vulvar itching Sexually transmitted infection risk: very low risk of STD exposure.   The following portions of the patient's history were reviewed and updated as appropriate: allergies, current medications, past family history, past medical history, past social history, past surgical history and problem list.   Review of Systems Pertinent items noted in HPI and remainder of comprehensive ROS otherwise negative.    Objective:    BP 124/78 mmHg  Pulse 86  Ht 5\' 5"  (1.651 m)  Wt 196 lb 14.4 oz (89.313 kg)  BMI 32.77 kg/m2  LMP 06/24/2014 General appearance: alert and no distress Abdomen: soft, non-tender; bowel sounds normal; no masses,  no organomegaly Pelvic: external genitalia normal, rectovaginal septum normal, uterus surgically absent and positive findings: Vagina with thin white discharge. Very mild atrophy noted of vagina Extremities: extremities normal, atraumatic, no cyanosis or edema Skin: Skin color, texture, turgor normal. No rashes or lesions      Microscopic wet-mount exam shows clue cells. No trichomonads, WBCs. KOH done.  No yeast or budding hyphae.   Assessment:    Bacterial vaginosis.   Cannot r/o postmenopausal atrophy (although mild) Vasomotor symptoms   Plan:   - Bacterial vaginosis - Metrogel prescribed (patient notes trying the pills last  visit, desires to see if gel would help more).  Also discussed that since this is noted to be recurring (mostly after intercourse) to take a prophylactic dose after intercourse (Flagyl 500 mg) tablet. Discussed appropriate hygiene measures (using milder soaps, patient currently using Dial soap; no strong detergents or perfumes in vaginal area).  - Discussed possibility of mild vaginal atrophy being cause of persistent vaginal itching/irritation.  If no resolution of itching after antibiotic use, would recommend trial of Premarin cream in vagina, ~ once weekly.  Sample given.Patient to notify MD by phone if samples used, and if symptoms improved.  - Can continue with Clonidine for vasomotor symptoms.  - To f/u if symptoms worsen or fail to improve.     Hildred LaserAnika Harm Jou, MD [facility]

## 2015-09-23 NOTE — Progress Notes (Signed)
S: C/o runny nose and congestion with sore throat for 2 days, no fever, chills, cp/sob, v/d;  Cough is dry, grandson has strep and has been drinking after her Using otc meds: robitussin  O: PE: vitals wnl, nad,  perrl eomi, normocephalic, tms dull, nasal mucosa red and swollen, throat injected, neck supple no lymph, lungs c t a, cv rrr, neuro intact, cough is dry  A:  Acute uri   P: zpack, drink fluids, continue regular meds , use otc meds of choice, return if not improving in 5 days, return earlier if worsening

## 2015-10-09 ENCOUNTER — Other Ambulatory Visit: Payer: Self-pay | Admitting: Family Medicine

## 2015-10-09 ENCOUNTER — Ambulatory Visit
Admission: RE | Admit: 2015-10-09 | Discharge: 2015-10-09 | Disposition: A | Discharge status: Home / Self Care | Payer: 59 | Source: Ambulatory Visit | Attending: Family Medicine | Admitting: Family Medicine

## 2015-10-09 DIAGNOSIS — R928 Other abnormal and inconclusive findings on diagnostic imaging of breast: Secondary | ICD-10-CM | POA: Diagnosis not present

## 2015-10-09 DIAGNOSIS — N63 Unspecified lump in breast: Secondary | ICD-10-CM | POA: Diagnosis not present

## 2015-10-09 DIAGNOSIS — N6489 Other specified disorders of breast: Secondary | ICD-10-CM | POA: Diagnosis not present

## 2015-11-27 ENCOUNTER — Ambulatory Visit: Payer: Self-pay | Admitting: Physician Assistant

## 2015-11-27 ENCOUNTER — Encounter: Payer: Self-pay | Admitting: Physician Assistant

## 2015-11-27 VITALS — BP 128/90 | HR 80 | Temp 98.5°F

## 2015-11-27 DIAGNOSIS — J3089 Other allergic rhinitis: Secondary | ICD-10-CM

## 2015-11-27 DIAGNOSIS — K219 Gastro-esophageal reflux disease without esophagitis: Secondary | ICD-10-CM

## 2015-11-27 MED ORDER — RANITIDINE HCL 150 MG PO TABS: 150.0000 mg | ORAL_TABLET | Freq: Two times a day (BID) | ORAL | Status: DC

## 2015-11-27 MED ORDER — FLUTICASONE PROPIONATE 50 MCG/ACT NA SUSP: 2.0000 | Freq: Every day | NASAL | Status: DC

## 2015-11-27 NOTE — Progress Notes (Signed)
S: c/o runny nose, congestion, watery eyes, some sinus pressure, and headache;  sx for 3 days, denies fever/chills/body aches, cough, cp/sob, or v/d, states he would also like a different reflux med, her doctor started her on prilosec but its causing memory loss  O: vitals wnl, nad, perrl eomi, conjunctiva wnl, tms dull, nasal mucosa swollen and boggy, throat wnl, neck supple no lymph, lungs c t a, cv rrr  A: acute seasonal allergies, hx of gerd  P: saline nasal rinse, claritin, flonase, zantac instead of prilosec

## 2015-12-18 ENCOUNTER — Telehealth: Payer: Self-pay

## 2015-12-18 DIAGNOSIS — I10 Essential (primary) hypertension: Secondary | ICD-10-CM

## 2015-12-18 DIAGNOSIS — E559 Vitamin D deficiency, unspecified: Secondary | ICD-10-CM

## 2015-12-18 DIAGNOSIS — E785 Hyperlipidemia, unspecified: Secondary | ICD-10-CM

## 2015-12-18 NOTE — Telephone Encounter (Signed)
Patient is asking to come in earlier for physical due to being off from work next week.  Lab orders placed for visit.   Will call patient back with appointment information

## 2015-12-24 ENCOUNTER — Other Ambulatory Visit: Payer: Self-pay | Admitting: Family Medicine

## 2015-12-24 DIAGNOSIS — E785 Hyperlipidemia, unspecified: Secondary | ICD-10-CM | POA: Diagnosis not present

## 2015-12-24 DIAGNOSIS — I10 Essential (primary) hypertension: Secondary | ICD-10-CM | POA: Diagnosis not present

## 2015-12-24 DIAGNOSIS — E559 Vitamin D deficiency, unspecified: Secondary | ICD-10-CM | POA: Diagnosis not present

## 2015-12-25 ENCOUNTER — Ambulatory Visit (INDEPENDENT_AMBULATORY_CARE_PROVIDER_SITE_OTHER): Payer: 59 | Admitting: Family Medicine

## 2015-12-25 ENCOUNTER — Other Ambulatory Visit (HOSPITAL_COMMUNITY)
Admission: RE | Admit: 2015-12-25 | Discharge: 2015-12-25 | Disposition: A | Discharge status: Home / Self Care | Payer: 59 | Source: Ambulatory Visit | Attending: Family Medicine | Admitting: Family Medicine

## 2015-12-25 ENCOUNTER — Encounter: Payer: Self-pay | Admitting: Family Medicine

## 2015-12-25 ENCOUNTER — Encounter (INDEPENDENT_AMBULATORY_CARE_PROVIDER_SITE_OTHER): Payer: Self-pay

## 2015-12-25 VITALS — BP 108/74 | HR 80 | Resp 18 | Ht 65.0 in | Wt 194.0 lb

## 2015-12-25 DIAGNOSIS — R1013 Epigastric pain: Secondary | ICD-10-CM

## 2015-12-25 DIAGNOSIS — R1010 Upper abdominal pain, unspecified: Secondary | ICD-10-CM

## 2015-12-25 DIAGNOSIS — Z01419 Encounter for gynecological examination (general) (routine) without abnormal findings: Secondary | ICD-10-CM | POA: Diagnosis not present

## 2015-12-25 DIAGNOSIS — Z0000 Encounter for general adult medical examination without abnormal findings: Secondary | ICD-10-CM | POA: Diagnosis not present

## 2015-12-25 DIAGNOSIS — Z1151 Encounter for screening for human papillomavirus (HPV): Secondary | ICD-10-CM | POA: Diagnosis not present

## 2015-12-25 DIAGNOSIS — Z1211 Encounter for screening for malignant neoplasm of colon: Secondary | ICD-10-CM | POA: Diagnosis not present

## 2015-12-25 DIAGNOSIS — N760 Acute vaginitis: Secondary | ICD-10-CM | POA: Diagnosis not present

## 2015-12-25 DIAGNOSIS — Z1272 Encounter for screening for malignant neoplasm of vagina: Secondary | ICD-10-CM | POA: Diagnosis not present

## 2015-12-25 LAB — VITAMIN D 25 HYDROXY (VIT D DEFICIENCY, FRACTURES): Vit D, 25-Hydroxy: 29 ng/mL — ABNORMAL LOW (ref 30–100)

## 2015-12-25 LAB — LIPID PANEL
Cholesterol: 233 mg/dL — ABNORMAL HIGH (ref 125–200)
HDL: 82 mg/dL (ref >=46)
LDL Cholesterol: 135 mg/dL — ABNORMAL HIGH (ref <130)
Total CHOL/HDL Ratio: 2.8 Ratio (ref <=5.0)
Triglycerides: 78 mg/dL (ref <150)
VLDL: 16 mg/dL (ref <30)

## 2015-12-25 LAB — TSH: TSH: 1.71 mIU/L

## 2015-12-25 LAB — POC HEMOCCULT BLD/STL (OFFICE/1-CARD/DIAGNOSTIC): Fecal Occult Blood, POC: NEGATIVE

## 2015-12-25 MED ORDER — SUCRALFATE 1 GM/10ML PO SUSP: 1.0000 g | Freq: Three times a day (TID) | ORAL | Status: DC

## 2015-12-25 NOTE — Patient Instructions (Signed)
F/u in 6 month, call if you need me sooner  You are again referred to [place] your abdominal pain, VERY IMPORTANT that you KEEP appt!  Continue daily omeprazole and pepcid and I have sent in new additional medication , carafate to take 4 times daily also, until  You see Dr Karilyn Cotaehman  Please stop caffeine, cofee, tea, sodas as these make stomach pains worse  Please work on good  health habits so that your health will improve. 1. Commitment to daily physical activity for 30 to 60  minutes, if you are able to do this.  2. Commitment to wise food choices. Aim for half of your  food intake to be vegetable and fruit, one quarter starchy foods, and one quarter protein. Try to eat on a regular schedule  3 meals per day, snacking between meals should be limited to vegetables or fruits or small portions of nuts. 64 ounces of water per day is generally recommended, unless you have specific health conditions, like heart failure or kidney failure where you will need to limit fluid intake.  3. Commitment to sufficient and a  good quality of physical and mental rest daily, generally between 6 to 8 hours per day.  WITH PERSISTANCE AND PERSEVERANCE, THE IMPOSSIBLE , BECOMES THE NORM!   Thank you  for choosing [facility]. We consider it a privelige to serve you.  Delivering excellent health care in a caring and  compassionate way is our goal.  Partnering with you,  so that together we can achieve this goal is our strategy.

## 2015-12-25 NOTE — Assessment & Plan Note (Signed)

## 2015-12-25 NOTE — Progress Notes (Signed)
   Subjective:    Patient ID: Kristy Travis Kristy Travis, female    DOB: 03/27/1966, 50 y.o.   MRN: [id number]  HPI Patient is in for annual physical exam. C/o chronic abdominal pain, hurts whenever she eats, has needed to be seen in [place] as well as at urgent care over past 9 months, [facility], now reports "ready to follow through" for help. C/o increased vaginal d/c with odor , wants this tested  Recent labs,  are reviewed. Immunization is reviewed , and  updated if needed.    Review of Systems See HPI     Objective:   Physical Exam  BP 108/74 mmHg  Pulse 80  Resp 18  Ht 5\' 5"  (1.651 m)  Wt 194 lb (87.998 kg)  BMI 32.28 kg/m2  SpO2 100%  LMP 06/24/2014  Pleasant well nourished female, alert and oriented x 3, in no cardio-pulmonary distress. Afebrile. HEENT No facial trauma or asymetry. Sinuses non tender.  Extra occullar muscles intact, pupils equally reactive to light. External ears normal, tympanic membranes clear. Oropharynx moist, no exudate,fairly  good dentition. Neck: supple, no adenopathy,JVD , mild  thyromegaly.No bruits.  Chest: Clear to ascultation bilaterally.No crackles or wheezes. Non tender to palpation  Breast: No asymetry,no masses or lumps. No tenderness. No nipple discharge or inversion. No axillary or supraclavicular adenopathy  Cardiovascular system; Heart sounds normal,  S1 and  S2 ,no S3.  No murmur, or thrill. Apical beat not displaced Peripheral pulses normal.  Abdomen: Soft, mild epigastric  tenderness, no organomegaly or masses. No bruits. Bowel sounds normal. No guarding, tenderness or rebound.  Rectal:  Normal sphincter tone. No mass.No rectal masses.  Guaiac negative stool.  GU: External genitalia normal female genitalia , female distribution of hair. No lesions. Urethral meatus normal in size, no  Prolapse, no lesions visibly  Present. Bladder non tender. Vagina pink and moist , with no visible lesions  ,white  discharge present . Adequate pelvic support no  cystocele or rectocele noted Cervix pink and appears healthy, no lesions or ulcerations noted, no discharge noted from os Uterus absent, no adnexal masses, no  adnexal tenderness.   Musculoskeletal exam: Full ROM of spine, hips , shoulders and knees. No deformity ,swelling or crepitus noted. No muscle wasting or atrophy.   Neurologic: Cranial nerves 2 to 12 intact. Power, tone ,sensation and reflexes normal throughout. No disturbance in gait. No tremor.  Skin: Intact, no ulceration, erythema , scaling or rash noted. Pigmentation normal throughout  Psych; Normal mood and affect. Judgement and concentration normal       Assessment & Plan:  Annual physical exam Annual exam as documented. Counseling done  re healthy lifestyle involving commitment to 150 minutes exercise per week, heart healthy diet, and attaining healthy weight.The importance of adequate sleep also discussed. Regular seat belt use and home safety, is also discussed. Changes in health habits are decided on by the patient with goals and time frames  set for achieving them. Immunization and cancer screening needs are specifically addressed at this visit.   Vaginitis and vulvovaginitis Specimens sent for culture  Abdominal pain 9 month h/o epigastric pain, worse with eating food, needs upper endoscopy, now agreeable to following through with GI, add carafate to current regime in the interim

## 2015-12-26 NOTE — Addendum Note (Signed)
Addended by: Abner GreenspanHUDY, Nelani Schmelzle H on: 12/26/2015 03:29 PM   Modules accepted: Orders

## 2015-12-28 NOTE — Assessment & Plan Note (Signed)
Specimens sent for culture 

## 2015-12-28 NOTE — Assessment & Plan Note (Signed)
9 month h/o epigastric pain, worse with eating food, needs upper endoscopy, now agreeable to following through with GI, add carafate to current regime in the interim

## 2015-12-29 LAB — CERVICOVAGINAL ANCILLARY ONLY: Wet Prep (BD Affirm): NEGATIVE

## 2015-12-29 LAB — CYTOLOGY - PAP

## 2015-12-30 LAB — COMPREHENSIVE METABOLIC PANEL
ALT: 11 U/L (ref 6–29)
AST: 13 U/L (ref 10–35)
Albumin: 4.2 g/dL (ref 3.6–5.1)
Alkaline Phosphatase: 72 U/L (ref 33–115)
BUN: 15 mg/dL (ref 7–25)
CO2: 28 mmol/L (ref 20–31)
Calcium: 9.4 mg/dL (ref 8.6–10.2)
Chloride: 106 mmol/L (ref 98–110)
Creat: 0.62 mg/dL (ref 0.50–1.10)
Glucose, Bld: 93 mg/dL (ref 65–99)
Potassium: 4.6 mmol/L (ref 3.5–5.3)
Sodium: 143 mmol/L (ref 135–146)
Total Bilirubin: 0.3 mg/dL (ref 0.2–1.2)
Total Protein: 6.8 g/dL (ref 6.1–8.1)

## 2016-01-06 ENCOUNTER — Other Ambulatory Visit (INDEPENDENT_AMBULATORY_CARE_PROVIDER_SITE_OTHER): Payer: Self-pay | Admitting: Internal Medicine

## 2016-01-06 ENCOUNTER — Encounter (INDEPENDENT_AMBULATORY_CARE_PROVIDER_SITE_OTHER): Payer: Self-pay | Admitting: *Deleted

## 2016-01-06 ENCOUNTER — Encounter (INDEPENDENT_AMBULATORY_CARE_PROVIDER_SITE_OTHER): Payer: Self-pay | Admitting: Internal Medicine

## 2016-01-06 ENCOUNTER — Ambulatory Visit (INDEPENDENT_AMBULATORY_CARE_PROVIDER_SITE_OTHER): Payer: 59 | Admitting: Internal Medicine

## 2016-01-06 VITALS — BP 126/80 | HR 76 | Temp 98.7°F | Ht 65.0 in | Wt 194.6 lb

## 2016-01-06 DIAGNOSIS — K219 Gastro-esophageal reflux disease without esophagitis: Secondary | ICD-10-CM

## 2016-01-06 MED ORDER — PANTOPRAZOLE SODIUM 40 MG PO TBEC: 40.0000 mg | DELAYED_RELEASE_TABLET | Freq: Two times a day (BID) | ORAL | Status: DC

## 2016-01-06 NOTE — Patient Instructions (Signed)
Rx for Protonix 40mg  BID til procedure.  The risks and benefits such as perforation, bleeding, and infection were reviewed with the patient and is agreeable.

## 2016-01-06 NOTE — Progress Notes (Signed)
Subjective:    Patient ID: Kristy Travis, female    DOB: Oct 16, 1965, 50 y.o.   MRN: [id number] PCP: Dr. Lodema HongSimpson.  HPI Hx of Gastric Sleeve in March 2015 by [facility]. She has lost almost a 100 pounds since her surgery. She tells me she has epigastric pain when she eats. Symptoms x 5 months. She describes the pain as a"hurt" after she eats.  She has tried Omeprazole which did not help. She is taking Carafate which she started yesterday. No nausea or vomiting. No fever. No dysphagia.  Her appetite for the most part is good. She usually has a BM daily. No melena or BRRB. No NSAIDs.  No family hx of colon cancer. She did have a 1st cousin that had colon cancer in her 260s.   CMP     Component Value Date/Time   NA 143 12/24/2015 0823   NA 142 08/06/2014 0529   K 4.6 12/24/2015 0823   K 4.1 08/06/2014 0529   CL 106 12/24/2015 0823   CL 105 08/06/2014 0529   CO2 28 12/24/2015 0823   CO2 27 08/06/2014 0529   GLUCOSE 93 12/24/2015 0823   GLUCOSE 82 08/06/2014 0529   BUN 15 12/24/2015 0823   BUN 6* 08/06/2014 0529   CREATININE 0.62 12/24/2015 0823   CREATININE 0.70 06/17/2015 1409   CREATININE 0.52* 08/06/2014 0529   CALCIUM 9.4 12/24/2015 0823   CALCIUM 9.5 08/06/2014 0529   PROT 6.8 12/24/2015 0823   PROT 6.8 08/06/2014 0529   ALBUMIN 4.2 12/24/2015 0823   ALBUMIN 3.2* 08/06/2014 0529   AST 13 12/24/2015 0823   AST 40* 08/06/2014 0529   ALT 11 12/24/2015 0823   ALT 68* 08/06/2014 0529   ALKPHOS 72 12/24/2015 0823   ALKPHOS 90 08/06/2014 0529   BILITOT 0.3 12/24/2015 0823   BILITOT 0.2 08/06/2014 0529   GFRNONAA >60 06/17/2015 1409   GFRNONAA >89 03/20/2015 1136   GFRNONAA >60 08/06/2014 0529   GFRNONAA >60 10/03/2013 0507   GFRAA >60 06/17/2015 1409   GFRAA >89 03/20/2015 1136   GFRAA >60 08/06/2014 0529   GFRAA >60 10/03/2013 0507      Review of Systems Past Medical History  Diagnosis Date  . Carpal tunnel syndrome    . Arthritis   . Chronic fatigue   . Hypertension   . Obesity   . Pancreatitis 07/2014    had  gall bladder removed , spent 1 week in hospital    Past Surgical History  Procedure Laterality Date  . Laparoscopy to eval dyspepsia  2004  . Dilation and curettage of uterus    . Cyst removed from left foot and bone removed from left foot  April and June 2010  . Cholecystectomy  07/2014  . Bariatric surgery N/A 09/17/2013    sleeve  . Total vaginal hysterectomy  06/2014    with BSO    Allergies  Allergen Reactions  . Maxzide [Triamterene-Hctz] Nausea Only    Current Outpatient Prescriptions on File Prior to Visit  Medication Sig Dispense Refill  . fluticasone (FLONASE) 50 MCG/ACT nasal spray Place 2 sprays into both nostrils daily. (Patient taking differently: Place 2 sprays into both nostrils as needed. ) 16 g 6  . ranitidine (ZANTAC) 150 MG tablet Take 1 tablet (150 mg total) by mouth 2 (two) times daily. 60 tablet 3  . sucralfate (CARAFATE) 1 GM/10ML suspension Take 10 mLs (1 g total) by mouth 4 (four)  times daily -  with meals and at bedtime. 420 mL 0  . omeprazole (PRILOSEC) 20 MG capsule Take 20 mg by mouth daily. Reported on 01/06/2016     No current facility-administered medications on file prior to visit.        Objective:   Physical ExamBlood pressure 126/80, pulse 76, temperature 98.7 F (37.1 C), height 5\' 5"  (1.651 m), weight 194 lb 9.6 oz (88.27 kg), last menstrual period 06/24/2014. Alert and oriented. Skin warm and dry. Oral mucosa is moist.   . Sclera anicteric, conjunctivae is pink. Thyroid not enlarged. No cervical lymphadenopathy. Lungs clear. Heart regular rate and rhythm.  Abdomen is soft. Bowel sounds are positive. No hepatomegaly. No abdominal masses felt. No tenderness.  No edema to lower extremities.          Assessment & Plan:  GERD. PUD needs to be ruled out.  Rx for Protonix BID. EGD. The risks and benefits such as perforation, bleeding, and  infection were reviewed with the patient and is agreeable.

## 2016-03-03 ENCOUNTER — Ambulatory Visit (HOSPITAL_COMMUNITY)
Admission: RE | Admit: 2016-03-03 | Discharge: 2016-03-03 | Disposition: A | Discharge status: Home / Self Care | Payer: 59 | Source: Ambulatory Visit | Attending: Internal Medicine | Admitting: Internal Medicine

## 2016-03-03 ENCOUNTER — Encounter (HOSPITAL_COMMUNITY): Payer: Self-pay | Admitting: *Deleted

## 2016-03-03 ENCOUNTER — Encounter (HOSPITAL_COMMUNITY)
Admission: RE | Disposition: A | Discharge status: Home / Self Care | Payer: Self-pay | Source: Ambulatory Visit | Attending: Internal Medicine

## 2016-03-03 DIAGNOSIS — Z9884 Bariatric surgery status: Secondary | ICD-10-CM | POA: Insufficient documentation

## 2016-03-03 DIAGNOSIS — I10 Essential (primary) hypertension: Secondary | ICD-10-CM | POA: Diagnosis not present

## 2016-03-03 DIAGNOSIS — Z79899 Other long term (current) drug therapy: Secondary | ICD-10-CM | POA: Diagnosis not present

## 2016-03-03 DIAGNOSIS — K3189 Other diseases of stomach and duodenum: Secondary | ICD-10-CM | POA: Diagnosis not present

## 2016-03-03 DIAGNOSIS — M1990 Unspecified osteoarthritis, unspecified site: Secondary | ICD-10-CM | POA: Diagnosis not present

## 2016-03-03 DIAGNOSIS — K210 Gastro-esophageal reflux disease with esophagitis: Secondary | ICD-10-CM | POA: Insufficient documentation

## 2016-03-03 DIAGNOSIS — K319 Disease of stomach and duodenum, unspecified: Secondary | ICD-10-CM | POA: Diagnosis not present

## 2016-03-03 DIAGNOSIS — R1013 Epigastric pain: Secondary | ICD-10-CM

## 2016-03-03 DIAGNOSIS — K2960 Other gastritis without bleeding: Secondary | ICD-10-CM | POA: Diagnosis not present

## 2016-03-03 DIAGNOSIS — K2970 Gastritis, unspecified, without bleeding: Secondary | ICD-10-CM | POA: Diagnosis not present

## 2016-03-03 DIAGNOSIS — K219 Gastro-esophageal reflux disease without esophagitis: Secondary | ICD-10-CM

## 2016-03-03 DIAGNOSIS — R12 Heartburn: Secondary | ICD-10-CM

## 2016-03-03 HISTORY — DX: Nausea with vomiting, unspecified: R11.2

## 2016-03-03 HISTORY — PX: ESOPHAGOGASTRODUODENOSCOPY: SHX5428

## 2016-03-03 HISTORY — DX: Nausea with vomiting, unspecified: Z98.890

## 2016-03-03 SURGERY — EGD (ESOPHAGOGASTRODUODENOSCOPY)
Anesthesia: Moderate Sedation

## 2016-03-03 MED ORDER — PANTOPRAZOLE SODIUM 40 MG PO TBEC: 40.0000 mg | DELAYED_RELEASE_TABLET | Freq: Two times a day (BID) | ORAL | 3 refills | Status: DC

## 2016-03-03 MED ORDER — RANITIDINE HCL 150 MG PO TABS: 150.0000 mg | ORAL_TABLET | Freq: Every evening | ORAL | 3 refills | Status: DC | PRN

## 2016-03-03 MED ORDER — BUTAMBEN-TETRACAINE-BENZOCAINE 2-2-14 % EX AERO
INHALATION_SPRAY | CUTANEOUS | Status: DC | PRN
  Administered 2016-03-03: 2 via TOPICAL

## 2016-03-03 MED ORDER — MIDAZOLAM HCL 5 MG/5ML IJ SOLN
INTRAMUSCULAR | Status: DC | PRN
  Administered 2016-03-03: 2 mg via INTRAVENOUS
  Administered 2016-03-03: 3 mg via INTRAVENOUS
  Administered 2016-03-03: 2 mg via INTRAVENOUS

## 2016-03-03 MED ORDER — MEPERIDINE HCL 50 MG/ML IJ SOLN
INTRAMUSCULAR | Status: AC
  Filled 2016-03-03: qty 1

## 2016-03-03 MED ORDER — MIDAZOLAM HCL 5 MG/5ML IJ SOLN
INTRAMUSCULAR | Status: AC
  Filled 2016-03-03: qty 10

## 2016-03-03 MED ORDER — STERILE WATER FOR IRRIGATION IR SOLN
Status: DC | PRN
  Administered 2016-03-03: 100 mL

## 2016-03-03 MED ORDER — SODIUM CHLORIDE 0.9 % IV SOLN
INTRAVENOUS | Status: DC
  Administered 2016-03-03: 1000 mL via INTRAVENOUS

## 2016-03-03 MED ORDER — MEPERIDINE HCL 50 MG/ML IJ SOLN
INTRAMUSCULAR | Status: DC | PRN
  Administered 2016-03-03 (×2): 25 mg via INTRAVENOUS

## 2016-03-03 NOTE — Op Note (Signed)
[facility] Patient Name: Kristy Travis Procedure Date: 03/03/2016 1:45 PM MRN: [id number] Date of Birth: 1965-11-02 Attending MD: Lionel December , MD CSN: [id number] Age: 50 Admit Type: Outpatient Procedure:                Upper GI endoscopy Indications:              Epigastric abdominal pain and heartburn in a                            patient with history of sleeve gastrectomy. Providers:                Lionel December, MD, Edrick Kins, RN, Birder Robson, Technician Referring MD:             Milus Mallick. Simpson MD, MD Medicines:                Cetacaine spray, Meperidine 50 mg IV, Midazolam 7                            mg IV Complications:            No immediate complications. Estimated Blood Loss:     Estimated blood loss was minimal. Procedure:                Pre-Anesthesia Assessment:                           - Prior to the procedure, a History and Physical                            was performed, and patient medications and                            allergies were reviewed. The patient's tolerance of                            previous anesthesia was also reviewed. The risks                            and benefits of the procedure and the sedation                            options and risks were discussed with the patient.                            All questions were answered, and informed consent                            was obtained. Prior Anticoagulants: The patient has                            taken no previous anticoagulant or antiplatelet  agents. ASA Grade Assessment: I - A normal, healthy                            patient. After reviewing the risks and benefits,                            the patient was deemed in satisfactory condition to                            undergo the procedure.                           After obtaining informed consent, the endoscope was   passed under direct vision. Throughout the                            procedure, the patient's blood pressure, pulse, and                            oxygen saturations were monitored continuously. The                            EG-[id number] ([id number]) scope was introduced through the                            mouth, and advanced to the second part of duodenum.                            The upper GI endoscopy was accomplished without                            difficulty. The patient tolerated the procedure                            well. Scope In: 1:58:06 PM Scope Out: 2:09:19 PM Total Procedure Duration: 0 hours 11 minutes 13 seconds  Findings:      The upper third of the esophagus and middle third of the esophagus were       normal.      LA Grade B (one or more mucosal breaks greater than 5 mm, not extending       between the tops of two mucosal folds) esophagitis was found 33 to 35 cm       from the incisors.      The Z-line was regular and was found 35 cm from the incisors.      Evidence of a sleeve gastrectomy was found in the gastric fundus and in       the gastric body. This was characterized by healthy appearing mucosa.      Patchy mild inflammation characterized by erosions and erythema was       found in the prepyloric region of the stomach. Biopsies were taken with       a cold forceps for histology.      A small healed ulcer was found in the gastric antrum.      The duodenal bulb and second portion of the duodenum were normal. Impression:               -  Normal upper third of esophagus and middle third                            of esophagus.                           - LA Grade B reflux esophagitis. Erosions involving                            distal 2 cm of esophageal mucosa.                           - Z-line regular, 35 cm from the incisors.                           - A sleeve gastrectomy was found, characterized by                            healthy appearing mucosa.                            - Gastritis. Biopsied.                           - Scar in the gastric antrum.                           - Normal duodenal bulb and second portion of the                            duodenum. Moderate Sedation:      Moderate (conscious) sedation was administered by the endoscopy nurse       and supervised by the endoscopist. The following parameters were       monitored: oxygen saturation, heart rate, blood pressure, CO2       capnography and response to care. Total physician intraservice time was       16 minutes. Recommendation:           - Patient has a contact number available for                            emergencies. The signs and symptoms of potential                            delayed complications were discussed with the                            patient. Return to normal activities tomorrow.                            Written discharge instructions were provided to the                            patient.                           -  6 small meals daily today.                           - Continue present medications.                           - Discontinue PPIs today.                           - Use Protonix (pantoprazole) 40 mg PO BID today.                           - Await pathology results.                           - Return to GI office in 2 months. Procedure Code(s):        --- Professional ---                           [phone], Esophagogastroduodenoscopy, flexible,                            transoral; with biopsy, single or multiple                           99152, Moderate sedation services provided by the                            same physician or other qualified health care                            professional performing the diagnostic or                            therapeutic service that the sedation supports,                            requiring the presence of an independent trained                            observer to assist in the  monitoring of the                            patient's level of consciousness and physiological                            status; initial 15 minutes of intraservice time,                            patient age 72 years or older Diagnosis Code(s):        --- Professional ---                           K21.0, Gastro-esophageal reflux disease with  esophagitis                           Z98.84, Bariatric surgery status                           K29.70, Gastritis, unspecified, without bleeding                           K31.89, Other diseases of stomach and duodenum                           R10.13, Epigastric pain                           R12, Heartburn CPT copyright 2016 [employer]. All rights reserved. The codes documented in this report are preliminary and upon coder review may  be revised to meet current compliance requirements. Lionel DecemberNajeeb Asanti Craigo, MD Lionel DecemberNajeeb Kimori Tartaglia, MD 03/03/2016 2:26:56 PM This report has been signed electronically. Number of Addenda: 0

## 2016-03-03 NOTE — H&P (Signed)
Kristy Travis is an 50 y.o. female.   Chief Complaint: Patient is here for EGD. HPI: Patient is 50 year old female who presents with one-year history of intermittent epigastric pain which occurs after meals. She has not experienced nausea vomiting melena or rectal bleeding. She says she lost 100 pounds following sleeve gastrectomy in March 2015. She states she has gained 10 pounds since no weight has leveled off. She is on PPI for chronic heartburn. She does not take OTC NSAIDs. No history of peptic ulcer disease.  Past Medical History:  Diagnosis Date  . Arthritis   . Carpal tunnel syndrome   . Chronic fatigue   . Hypertension   . Obesity   . Pancreatitis 07/2014   had  gall bladder removed , spent 1 week in hospital  . PONV (postoperative nausea and vomiting)     Past Surgical History:  Procedure Laterality Date  . 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 CHOLECYSTECTOMY  07/2014  . cyst removed from left foot and bone removed from left foot  April and June 2010  . DILATION AND CURETTAGE OF UTERUS    . laparoscopy to eval dyspepsia  2004  . TOTAL VAGINAL HYSTERECTOMY  06/2014   with BSO    Family History  Problem Relation Age of Onset  . Thrombosis Mother   . Diabetes Father   . Pulmonary Hypertension Father   . Breast cancer Neg Hx    Social History:  reports that she has never smoked. She has never used smokeless tobacco. She reports that she drinks alcohol. She reports that she does not use drugs.  Allergies:  Allergies  Allergen Reactions  . Maxzide [Triamterene-Hctz] Nausea Only    Medications Prior to Admission  Medication Sig Dispense Refill  . cholecalciferol (VITAMIN D) 1000 units tablet Take 800 Units by mouth daily.    Marland Kitchen. omeprazole (PRILOSEC) 20 MG capsule Take 20 mg by mouth daily. Reported on 01/06/2016    . sucralfate (CARAFATE) 1 GM/10ML suspension Take 10 mLs (1 g total) by mouth 4 (four) times daily -  with meals and at bedtime. 420 mL 0  .  fluticasone (FLONASE) 50 MCG/ACT nasal spray Place 2 sprays into both nostrils daily. (Patient taking differently: Place 2 sprays into both nostrils as needed. ) 16 g 6  . pantoprazole (PROTONIX) 40 MG tablet Take 1 tablet (40 mg total) by mouth 2 (two) times daily before a meal. 60 tablet 0  . ranitidine (ZANTAC) 150 MG tablet Take 1 tablet (150 mg total) by mouth 2 (two) times daily. 60 tablet 3    No results found for this or any previous visit (from the past 48 hour(s)). No results found.  ROS  Blood pressure 118/76, pulse 75, temperature 98.8 F (37.1 C), temperature source Oral, resp. rate 20, height 5\' 5"  (1.651 m), weight 190 lb (86.2 kg), last menstrual period 06/24/2014, SpO2 100 %. Physical Exam  Constitutional: She appears well-developed and well-nourished.  HENT:  Mouth/Throat: Oropharynx is clear and moist.  Eyes: Conjunctivae are normal. No scleral icterus.  Neck: No thyromegaly present.  Cardiovascular: Normal rate, regular rhythm and normal heart sounds.   No murmur heard. Respiratory: Effort normal and breath sounds normal.  GI:  Abdomen symmetrical and soft. Mild midepigastric tenderness noted. No organomegaly or masses.  Musculoskeletal: She exhibits no edema.  Lymphadenopathy:    She has no cervical adenopathy.  Neurological: She is alert.  Skin: Skin is warm and dry.  Assessment/Plan Postprandial epigastric pain in a patient with prior sleeve gastrectomy(March 2015) for obesity. Chronic GERD. Diagnostic EGD.  Kristy DecemberNajeeb Perrion Diesel, MD 03/03/2016, 1:46 PM

## 2016-03-03 NOTE — Discharge Instructions (Addendum)
Discontinue omeprazole. Begin pantoprazole 40 mg by mouth 30 minutes before breakfast and evening meal daily. Can take Zantac OTC 150 mg by mouth at bedtime on as-needed basis. Resume other medications as before. Six small meals daily. No driving for 24 hours. Physician will call with biopsy results. Office visit in 8 weeks.  April 28, 2016 at 3:30PM with Delrae Rend at Dr. Patty Sermons office.  Gastrointestinal Endoscopy, Care After Refer to this sheet in the next few weeks. These instructions provide you with information on caring for yourself after your procedure. Your caregiver may also give you more specific instructions. Your treatment has been planned according to current medical practices, but problems sometimes occur. Call your caregiver if you have any problems or questions after your procedure. HOME CARE INSTRUCTIONS  If you were given medicine to help you relax (sedative), do not drive, operate machinery, or sign important documents for 24 hours.  Avoid alcohol and hot or warm beverages for the first 24 hours after the procedure.  Only take over-the-counter or prescription medicines for pain, discomfort, or fever as directed by your caregiver. You may resume taking your normal medicines unless your caregiver tells you otherwise. Ask your caregiver when you may resume taking medicines that may cause bleeding, such as aspirin, clopidogrel, or warfarin.  You may return to your normal diet and activities on the day after your procedure, or as directed by your caregiver. Walking may help to reduce any bloated feeling in your abdomen.  Drink enough fluids to keep your urine clear or pale yellow.  You may gargle with salt water if you have a sore throat. SEEK IMMEDIATE MEDICAL CARE IF:  You have severe nausea or vomiting.  You have severe abdominal pain, abdominal cramps that last longer than 6 hours, or abdominal swelling (distention).  You have severe shoulder or back  pain.  You have trouble swallowing.  You have shortness of breath, your breathing is shallow, or you are breathing faster than normal.  You have a fever or a rapid heartbeat.  You vomit blood or material that looks like coffee grounds.  You have bloody, black, or tarry stools. MAKE SURE YOU:  Understand these instructions.  Will watch your condition.  Will get help right away if you are not doing well or get worse.   This information is not intended to replace advice given to you by your health care provider. Make sure you discuss any questions you have with your health care provider.   Gastroesophageal Reflux Disease, Adult Normally, food travels down the esophagus and stays in the stomach to be digested. However, when a person has gastroesophageal reflux disease (GERD), food and stomach acid move back up into the esophagus. When this happens, the esophagus becomes sore and inflamed. Over time, GERD can create small holes (ulcers) in the lining of the esophagus.  CAUSES This condition is caused by a problem with the muscle between the esophagus and the stomach (lower esophageal sphincter, or LES). Normally, the LES muscle closes after food passes through the esophagus to the stomach. When the LES is weakened or abnormal, it does not close properly, and that allows food and stomach acid to go back up into the esophagus. The LES can be weakened by certain dietary substances, medicines, and medical conditions, including:  Tobacco use.  Pregnancy.  Having a hiatal hernia.  Heavy alcohol use.  Certain foods and beverages, such as coffee, chocolate, onions, and peppermint. RISK FACTORS This condition is more likely to develop  in:  People who have an increased body weight.  People who have connective tissue disorders.  People who use NSAID medicines. SYMPTOMS Symptoms of this condition include:  Heartburn.  Difficult or painful swallowing.  The feeling of having a lump in  the throat.  Abitter taste in the mouth.  Bad breath.  Having a large amount of saliva.  Having an upset or bloated stomach.  Belching.  Chest pain.  Shortness of breath or wheezing.  Ongoing (chronic) cough or a night-time cough.  Wearing away of tooth enamel.  Weight loss. Different conditions can cause chest pain. Make sure to see your health care provider if you experience chest pain. DIAGNOSIS Your health care provider will take a medical history and perform a physical exam. To determine if you have mild or severe GERD, your health care provider may also monitor how you respond to treatment. You may also have other tests, including:  An endoscopy toexamine your stomach and esophagus with a small camera.  A test thatmeasures the acidity level in your esophagus.  A test thatmeasures how much pressure is on your esophagus.  A barium swallow or modified barium swallow to show the shape, size, and functioning of your esophagus. TREATMENT The goal of treatment is to help relieve your symptoms and to prevent complications. Treatment for this condition may vary depending on how severe your symptoms are. Your health care provider may recommend:  Changes to your diet.  Medicine.  Surgery. HOME CARE INSTRUCTIONS Diet  Follow a diet as recommended by your health care provider. This may involve avoiding foods and drinks such as:  Coffee and tea (with or without caffeine).  Drinks that containalcohol.  Energy drinks and sports drinks.  Carbonated drinks or sodas.  Chocolate and cocoa.  Peppermint and mint flavorings.  Garlic and onions.  Horseradish.  Spicy and acidic foods, including peppers, chili powder, curry powder, vinegar, hot sauces, and barbecue sauce.  Citrus fruit juices and citrus fruits, such as oranges, lemons, and limes.  Tomato-based foods, such as red sauce, chili, salsa, and pizza with red sauce.  Fried and fatty foods, such as donuts,  french fries, potato chips, and high-fat dressings.  High-fat meats, such as hot dogs and fatty cuts of red and white meats, such as rib eye steak, sausage, ham, and bacon.  High-fat dairy items, such as whole milk, butter, and cream cheese.  Eat small, frequent meals instead of large meals.  Avoid drinking large amounts of liquid with your meals.  Avoid eating meals during the 2-3 hours before bedtime.  Avoid lying down right after you eat.  Do not exercise right after you eat. General Instructions  Pay attention to any changes in your symptoms.  Take over-the-counter and prescription medicines only as told by your health care provider. Do not take aspirin, ibuprofen, or other NSAIDs unless your health care provider told you to do so.  Do not use any tobacco products, including cigarettes, chewing tobacco, and e-cigarettes. If you need help quitting, ask your health care provider.  Wear loose-fitting clothing. Do not wear anything tight around your waist that causes pressure on your abdomen.  Raise (elevate) the head of your bed 6 inches (15cm).  Try to reduce your stress, such as with yoga or meditation. If you need help reducing stress, ask your health care provider.  If you are overweight, reduce your weight to an amount that is healthy for you. Ask your health care provider for guidance about a safe  weight loss goal.  Keep all follow-up visits as told by your health care provider. This is important. SEEK MEDICAL CARE IF:  You have new symptoms.  You have unexplained weight loss.  You have difficulty swallowing, or it hurts to swallow.  You have wheezing or a persistent cough.  Your symptoms do not improve with treatment.  You have a hoarse voice. SEEK IMMEDIATE MEDICAL CARE IF:  You have pain in your arms, neck, jaw, teeth, or back.  You feel sweaty, dizzy, or light-headed.  You have chest pain or shortness of breath.  You vomit and your vomit looks like  blood or coffee grounds.  You faint.  Your stool is bloody or black.  You cannot swallow, drink, or eat.   This information is not intended to replace advice given to you by your health care provider. Make sure you discuss any questions you have with your health care provider.

## 2016-03-08 ENCOUNTER — Encounter (HOSPITAL_COMMUNITY): Payer: Self-pay | Admitting: Internal Medicine

## 2016-04-20 ENCOUNTER — Encounter: Payer: Self-pay | Admitting: Physician Assistant

## 2016-04-20 ENCOUNTER — Ambulatory Visit: Payer: Self-pay | Admitting: Physician Assistant

## 2016-04-20 VITALS — BP 110/80 | HR 72 | Temp 97.7°F

## 2016-04-20 DIAGNOSIS — N390 Urinary tract infection, site not specified: Secondary | ICD-10-CM

## 2016-04-20 DIAGNOSIS — R310 Gross hematuria: Secondary | ICD-10-CM

## 2016-04-20 DIAGNOSIS — R319 Hematuria, unspecified: Secondary | ICD-10-CM

## 2016-04-20 LAB — POCT URINALYSIS DIPSTICK
Bilirubin, UA: NEGATIVE
Blood, UA: NEGATIVE
Glucose, UA: NEGATIVE
Ketones, UA: NEGATIVE
Nitrite, UA: NEGATIVE
Spec Grav, UA: 1.02
Urobilinogen, UA: 1
pH, UA: 7

## 2016-04-20 MED ORDER — CIPROFLOXACIN HCL 500 MG PO TABS: 500.0000 mg | ORAL_TABLET | Freq: Two times a day (BID) | ORAL | 0 refills | Status: DC

## 2016-04-20 NOTE — Progress Notes (Signed)
S: c/o back pain and blood in urine states she has several kidney stones but was given medicine to dissolve them, just doesn't feel well, no fever/chills/v/d;   O: vitals wnl, nad, lungs c t a, cv rrr, no cva tenderness, abd soft nontender bs normal all 4 quads, ua protein, 1+leuks  A: uti  P: cipro 500mg  bid x 3d, f/u with urology

## 2016-04-22 DIAGNOSIS — N23 Unspecified renal colic: Secondary | ICD-10-CM | POA: Diagnosis not present

## 2016-04-22 DIAGNOSIS — N200 Calculus of kidney: Secondary | ICD-10-CM | POA: Diagnosis not present

## 2016-04-22 DIAGNOSIS — R110 Nausea: Secondary | ICD-10-CM | POA: Diagnosis not present

## 2016-04-23 ENCOUNTER — Telehealth: Payer: Self-pay | Admitting: Physician Assistant

## 2016-04-23 ENCOUNTER — Other Ambulatory Visit: Payer: Self-pay | Admitting: Emergency Medicine

## 2016-04-23 MED ORDER — FLUCONAZOLE 150 MG PO TABS: 150.0000 mg | ORAL_TABLET | Freq: Once | ORAL | 0 refills | Status: AC

## 2016-04-23 NOTE — Progress Notes (Signed)
Patient contacted office left message stated that she now has a yeast inf. Per Darl PikesSusan ok'd Diflucan

## 2016-04-27 DIAGNOSIS — N200 Calculus of kidney: Secondary | ICD-10-CM | POA: Diagnosis not present

## 2016-04-28 ENCOUNTER — Ambulatory Visit (INDEPENDENT_AMBULATORY_CARE_PROVIDER_SITE_OTHER): Payer: 59 | Admitting: Internal Medicine

## 2016-04-28 ENCOUNTER — Encounter (INDEPENDENT_AMBULATORY_CARE_PROVIDER_SITE_OTHER): Payer: Self-pay | Admitting: Internal Medicine

## 2016-04-28 VITALS — BP 138/70 | HR 72 | Temp 98.2°F | Ht 65.0 in | Wt 196.2 lb

## 2016-04-28 DIAGNOSIS — K219 Gastro-esophageal reflux disease without esophagitis: Secondary | ICD-10-CM | POA: Diagnosis not present

## 2016-04-28 NOTE — Progress Notes (Signed)
Subjective:    Patient ID: Kristy Travis, female    DOB: 1965/11/04, 50 y.o.   MRN: [id number]  HPI Here today for f/u after recently undergoing EGD in August for epigastric pain, heart burn, with hx of sleeve gastrectomy.  Impression:               - Normal upper third of esophagus and middle third                            of esophagus.                           - LA Grade B reflux esophagitis. Erosions involving                            distal 2 cm of esophageal mucosa.                           - Z-line regular, 35 cm from the incisors.                           - A sleeve gastrectomy was found, characterized by                            healthy appearing mucosa.                           - Gastritis. Biopsied.                           - Scar in the gastric antrum.                           - Normal duodenal bulb and second portion of the                            duodenum.  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 Pylori. She tells me she is doing good. The Protonix has helps. She occasionally has break thru acid reflux. She is avoiding spicy foods. Appetite is good. She is trying to loose weight.  Her BMs are normal.  She feels 50% better,  She occasionally has chest pain . No NSAIDS.   Review of Systems  Past Medical History:  Diagnosis Date  . Arthritis   . Carpal tunnel syndrome   . Chronic fatigue   . Hypertension   . Obesity   . Pancreatitis 07/2014   had  gall bladder removed , spent 1 week in hospital  . PONV (postoperative nausea and vomiting)     Past Surgical History:  Procedure Laterality Date  . 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 CHOLECYSTECTOMY  07/2014  . cyst removed from left foot and bone removed from left foot  April and June 2010  . DILATION AND CURETTAGE OF UTERUS    . ESOPHAGOGASTRODUODENOSCOPY N/A 03/03/2016   Procedure: ESOPHAGOGASTRODUODENOSCOPY (EGD);  Surgeon: Malissa HippoNajeeb U Rehman, MD;   Location: [facility];  Service: Endoscopy;  Laterality: N/A;  3:00 - moved to 2:00 - Ann to  notify pt  . laparoscopy to eval dyspepsia  2004  . TOTAL VAGINAL HYSTERECTOMY  06/2014   with BSO    Allergies  Allergen Reactions  . Maxzide [Triamterene-Hctz] Nausea Only    Current Outpatient Prescriptions on File Prior to Visit  Medication Sig Dispense Refill  . cholecalciferol (VITAMIN D) 1000 units tablet Take 800 Units by mouth daily.    . pantoprazole (PROTONIX) 40 MG tablet Take 1 tablet (40 mg total) by mouth 2 (two) times daily before a meal. 60 tablet 3   No current facility-administered medications on file prior to visit.        Objective:   Physical Exam Blood pressure 138/70, pulse 72, temperature 98.2 F (36.8 C), height 5\' 5"  (1.651 m), weight 196 lb 3.2 oz (89 kg), last menstrual period 06/24/2014. Alert and oriented. Skin warm and dry. Oral mucosa is moist.   . Sclera anicteric, conjunctivae is pink. Thyroid not enlarged. No cervical lymphadenopathy. Lungs clear. Heart regular rate and rhythm.  Abdomen is soft. Bowel sounds are positive. No hepatomegaly. No abdominal masses felt. No tenderness.  No edema to lower extremities.          Assessment & Plan:  GERD. Continue the Protonix before breakfast and 30 minutes before supper. Zantac at hs GERD diet given to patient.   OV in 3 months

## 2016-04-28 NOTE — Patient Instructions (Signed)
Continue Protonix BID and Zantac at hs.

## 2016-05-11 DIAGNOSIS — N23 Unspecified renal colic: Secondary | ICD-10-CM | POA: Diagnosis not present

## 2016-05-11 DIAGNOSIS — M5489 Other dorsalgia: Secondary | ICD-10-CM | POA: Diagnosis not present

## 2016-05-31 ENCOUNTER — Other Ambulatory Visit: Payer: Self-pay

## 2016-05-31 DIAGNOSIS — R928 Other abnormal and inconclusive findings on diagnostic imaging of breast: Secondary | ICD-10-CM

## 2016-06-15 ENCOUNTER — Encounter (HOSPITAL_COMMUNITY): Payer: Self-pay

## 2016-06-15 ENCOUNTER — Telehealth: Payer: 59 | Admitting: Family

## 2016-06-15 DIAGNOSIS — B9689 Other specified bacterial agents as the cause of diseases classified elsewhere: Secondary | ICD-10-CM | POA: Diagnosis not present

## 2016-06-15 DIAGNOSIS — J028 Acute pharyngitis due to other specified organisms: Secondary | ICD-10-CM | POA: Diagnosis not present

## 2016-06-15 MED ORDER — BENZONATATE 100 MG PO CAPS: 100.0000 mg | ORAL_CAPSULE | Freq: Three times a day (TID) | ORAL | 0 refills | Status: DC | PRN

## 2016-06-15 MED ORDER — PREDNISONE 5 MG PO TABS: 5.0000 mg | ORAL_TABLET | ORAL | 0 refills | Status: DC

## 2016-06-15 NOTE — Progress Notes (Signed)
We are sorry that you are not feeling well.  Here is how we plan to help!  Based on what you have shared with me it looks like you have upper respiratory tract inflammation that has resulted in a significant cough.  Inflammation and infection in the upper respiratory tract is commonly called bronchitis and has four common causes:  Allergies, Viral Infections, Acid Reflux and Bacterial Infections.  Allergies, viruses and acid reflux are treated by controlling symptoms or eliminating the cause. An example might be a cough caused by taking certain blood pressure medications. You stop the cough by changing the medication. Another example might be a cough caused by acid reflux. Controlling the reflux helps control the cough.  Based on your presentation I believe you most likely have A cough due to a virus.  This is called viral bronchitis and is best treated by rest, plenty of fluids and control of the cough.  You may use Ibuprofen or Tylenol as directed to help your symptoms.     In addition you may use A non-prescription cough medication called Mucinex DM: take 2 tablets every 12 hours. and A prescription cough medication called Tessalon Perles 100mg. You may take 1-2 capsules every 8 hours as needed for your cough.  Sterapred 5 mg dosepak  USE OF BRONCHODILATOR ("RESCUE") INHALERS: There is a risk from using your bronchodilator too frequently.  The risk is that over-reliance on a medication which only relaxes the muscles surrounding the breathing tubes can reduce the effectiveness of medications prescribed to reduce swelling and congestion of the tubes themselves.  Although you feel brief relief from the bronchodilator inhaler, your asthma may actually be worsening with the tubes becoming more swollen and filled with mucus.  This can delay other crucial treatments, such as oral steroid medications. If you need to use a bronchodilator inhaler daily, several times per day, you should discuss this with your  provider.  There are probably better treatments that could be used to keep your asthma under control.     HOME CARE . Only take medications as instructed by your medical team. . Complete the entire course of an antibiotic. . Drink plenty of fluids and get plenty of rest. . Avoid close contacts especially the very young and the elderly . Cover your mouth if you cough or cough into your sleeve. . Always remember to wash your hands . A steam or ultrasonic humidifier can help congestion.   GET HELP RIGHT AWAY IF: . You develop worsening fever. . You become short of breath . You cough up blood. . Your symptoms persist after you have completed your treatment plan MAKE SURE YOU   Understand these instructions.  Will watch your condition.  Will get help right away if you are not doing well or get worse.  Your e-visit answers were reviewed by a board certified advanced clinical practitioner to complete your personal care plan.  Depending on the condition, your plan could have included both over the counter or prescription medications. If there is a problem please reply  once you have received a response from your provider. Your safety is important to us.  If you have drug allergies check your prescription carefully.    You can use MyChart to ask questions about today's visit, request a non-urgent call back, or ask for a work or school excuse for 24 hours related to this e-Visit. If it has been greater than 24 hours you will need to follow up with your provider,   or enter a new e-Visit to address those concerns. You will get an e-mail in the next two days asking about your experience.  I hope that your e-visit has been valuable and will speed your recovery. Thank you for using e-visits.   

## 2016-06-15 NOTE — Addendum Note (Signed)
Addended by: Beau FannyWITHROW, Kielan Dreisbach C on: 06/15/2016 01:24 PM   Modules accepted: Orders

## 2016-06-18 ENCOUNTER — Other Ambulatory Visit: Payer: Self-pay | Admitting: Family Medicine

## 2016-06-18 ENCOUNTER — Ambulatory Visit
Admission: RE | Admit: 2016-06-18 | Discharge: 2016-06-18 | Disposition: A | Discharge status: Home / Self Care | Payer: 59 | Source: Ambulatory Visit | Attending: Family Medicine | Admitting: Family Medicine

## 2016-06-18 DIAGNOSIS — N6001 Solitary cyst of right breast: Secondary | ICD-10-CM | POA: Diagnosis not present

## 2016-06-18 DIAGNOSIS — R928 Other abnormal and inconclusive findings on diagnostic imaging of breast: Secondary | ICD-10-CM

## 2016-06-28 ENCOUNTER — Encounter: Payer: Self-pay | Admitting: Family Medicine

## 2016-06-28 ENCOUNTER — Ambulatory Visit (INDEPENDENT_AMBULATORY_CARE_PROVIDER_SITE_OTHER): Payer: 59 | Admitting: Family Medicine

## 2016-06-28 VITALS — BP 118/82 | HR 92 | Temp 98.4°F | Resp 18 | Ht 65.0 in | Wt 197.1 lb

## 2016-06-28 DIAGNOSIS — E6609 Other obesity due to excess calories: Secondary | ICD-10-CM

## 2016-06-28 DIAGNOSIS — J209 Acute bronchitis, unspecified: Secondary | ICD-10-CM | POA: Diagnosis not present

## 2016-06-28 DIAGNOSIS — E559 Vitamin D deficiency, unspecified: Secondary | ICD-10-CM

## 2016-06-28 DIAGNOSIS — J301 Allergic rhinitis due to pollen: Secondary | ICD-10-CM

## 2016-06-28 DIAGNOSIS — E782 Mixed hyperlipidemia: Secondary | ICD-10-CM

## 2016-06-28 DIAGNOSIS — J0100 Acute maxillary sinusitis, unspecified: Secondary | ICD-10-CM

## 2016-06-28 MED ORDER — AZITHROMYCIN 250 MG PO TABS: ORAL_TABLET | ORAL | 0 refills | Status: DC

## 2016-06-28 MED ORDER — VITAMIN D 1000 UNITS PO TABS: 800.0000 [IU] | ORAL_TABLET | Freq: Every day | ORAL | 1 refills | Status: DC

## 2016-06-28 MED ORDER — PROMETHAZINE-DM 6.25-15 MG/5ML PO SYRP: ORAL_SOLUTION | ORAL | 0 refills | Status: DC

## 2016-06-28 NOTE — Assessment & Plan Note (Addendum)
1 week history of sinus pressure, z pack presctibed

## 2016-06-28 NOTE — Patient Instructions (Addendum)
Annual physical June 17 or after, call if you need me before  Azithromycin and cough suppressant are prescribed  Fasting lipid and vit D level this week please  PLAN T based eating is HEALTHY  Please work on good  health habits so that your health will improve. 1. Commitment to daily physical activity for 30 to 60  minutes, if you are able to do this.  2. Commitment to wise food choices. Aim for half of your  food intake to be vegetable and fruit, one quarter starchy foods, and one quarter protein. Try to eat on a regular schedule  3 meals per day, snacking between meals should be limited to vegetables or fruits or small portions of nuts. 64 ounces of water per day is generally recommended, unless you have specific health conditions, like heart failure or kidney failure where you will need to limit fluid intake.  3. Commitment to sufficient and a  good quality of physical and mental rest daily, generally between 6 to 8 hours per day.  WITH PERSISTANCE AND PERSEVERANCE, THE IMPOSSIBLE , BECOMES THE NORM!   Thank you  for choosing [facility]. We consider it a privelige to serve you.  Delivering excellent health care in a caring and  compassionate way is our goal.  Partnering with you,  so that together we can achieve this goal is our strategy.

## 2016-07-02 DIAGNOSIS — E782 Mixed hyperlipidemia: Secondary | ICD-10-CM | POA: Diagnosis not present

## 2016-07-02 DIAGNOSIS — E559 Vitamin D deficiency, unspecified: Secondary | ICD-10-CM | POA: Diagnosis not present

## 2016-07-05 DIAGNOSIS — J209 Acute bronchitis, unspecified: Secondary | ICD-10-CM | POA: Insufficient documentation

## 2016-07-05 NOTE — Progress Notes (Signed)
   Kristy Travis     MRN: [id number]      DOB: 11-21-1965   HPI Kristy Travis is here for follow up and re-evaluation of chronic medical conditions, medication management and review of any available recent lab and radiology data.  Preventive health is updated, specifically  Cancer screening and Immunization.   5 day h/o head and chest congestion with  Facial pressure and yellow sputum  ROS  Denies chest pains, palpitations and leg swelling Denies abdominal pain, nausea, vomiting,diarrhea or constipation.   Denies dysuria, frequency, hesitancy or incontinence. Denies joint pain, swelling and limitation in mobility. Denies headaches, seizures, numbness, or tingling. Denies depression, anxiety or insomnia. Denies skin break down or rash.   PE  BP 118/82 (BP Location: Right Arm, Patient Position: Sitting, Cuff Size: Normal)   Pulse 92   Temp 98.4 F (36.9 C) (Oral)   Resp 18   Ht 5\' 5"  (1.651 m)   Wt 197 lb 1.9 oz (89.4 kg)   LMP 06/24/2014   SpO2 98%   BMI 32.80 kg/m   Patient alert and oriented and in no cardiopulmonary distress.  HEENT: No facial asymmetry, EOMI,   oropharynx pink and moist.  Neck supple no JVD, no mass right .maxillary sinus tenderness  Chest: adequate air entry, bibasilar crackles, no wheezes CVS: S1, S2 no murmurs, no S3.Regular rate.  ABD: Soft non tender.   Ext: No edema  MS: Adequate ROM spine, shoulders, hips and knees.  Skin: Intact, no ulcerations or rash noted.  Psych: Good eye contact, normal affect. Memory intact not anxious or depressed appearing.  CNS: CN 2-12 intact, power,  normal throughout.no focal deficits noted.   Assessment & Plan  Maxillary sinusitis 1 week history of sinus pressure, z pack presctibed  Acute bronchitis Decongestant and antibiotic prescribed  Obesity Deteriorated. Patient re-educated about  the importance of commitment to a  minimum of 150 minutes of exercise per week.  The importance of  healthy food choices with portion control discussed. Encouraged to start a food diary, count calories and to consider  joining a support group. Sample diet sheets offered. Goals set by the patient for the next several months.   Weight /BMI 06/28/2016 04/28/2016 03/03/2016  WEIGHT 197 lb 1.9 oz 196 lb 3.2 oz 190 lb  HEIGHT 5\' 5"  5\' 5"  5\' 5"   BMI 32.8 kg/m2 32.65 kg/m2 31.62 kg/m2      Seasonal allergies No recent flare

## 2016-07-05 NOTE — Assessment & Plan Note (Signed)
Deteriorated. Patient re-educated about  the importance of commitment to a  minimum of 150 minutes of exercise per week.  The importance of healthy food choices with portion control discussed. Encouraged to start a food diary, count calories and to consider  joining a support group. Sample diet sheets offered. Goals set by the patient for the next several months.   Weight /BMI 06/28/2016 04/28/2016 03/03/2016  WEIGHT 197 lb 1.9 oz 196 lb 3.2 oz 190 lb  HEIGHT 5\' 5"  5\' 5"  5\' 5"   BMI 32.8 kg/m2 32.65 kg/m2 31.62 kg/m2

## 2016-07-05 NOTE — Assessment & Plan Note (Signed)
No recent flare.  

## 2016-07-05 NOTE — Assessment & Plan Note (Signed)
Decongestant and antibiotic prescribed 

## 2016-07-29 ENCOUNTER — Ambulatory Visit (INDEPENDENT_AMBULATORY_CARE_PROVIDER_SITE_OTHER): Payer: Self-pay | Admitting: Internal Medicine

## 2016-08-03 ENCOUNTER — Ambulatory Visit (INDEPENDENT_AMBULATORY_CARE_PROVIDER_SITE_OTHER): Payer: 59 | Admitting: Family Medicine

## 2016-08-03 ENCOUNTER — Encounter: Payer: Self-pay | Admitting: Family Medicine

## 2016-08-03 VITALS — BP 118/80 | HR 85 | Resp 16 | Ht 65.0 in | Wt 196.0 lb

## 2016-08-03 DIAGNOSIS — R1013 Epigastric pain: Secondary | ICD-10-CM | POA: Diagnosis not present

## 2016-08-03 DIAGNOSIS — E559 Vitamin D deficiency, unspecified: Secondary | ICD-10-CM | POA: Diagnosis not present

## 2016-08-03 DIAGNOSIS — E784 Other hyperlipidemia: Secondary | ICD-10-CM

## 2016-08-03 DIAGNOSIS — R350 Frequency of micturition: Secondary | ICD-10-CM | POA: Diagnosis not present

## 2016-08-03 DIAGNOSIS — E7849 Other hyperlipidemia: Secondary | ICD-10-CM

## 2016-08-03 DIAGNOSIS — Z634 Disappearance and death of family member: Secondary | ICD-10-CM

## 2016-08-03 DIAGNOSIS — F4321 Adjustment disorder with depressed mood: Secondary | ICD-10-CM

## 2016-08-03 LAB — POCT URINALYSIS DIPSTICK
Bilirubin, UA: NEGATIVE
Blood, UA: NEGATIVE
Glucose, UA: NEGATIVE
Nitrite, UA: NEGATIVE
Protein, UA: NEGATIVE
Spec Grav, UA: 1.02
Urobilinogen, UA: 1
pH, UA: 7.5

## 2016-08-03 MED ORDER — PHENTERMINE HCL 37.5 MG PO TABS: 37.5000 mg | ORAL_TABLET | Freq: Every day | ORAL | 1 refills | Status: DC

## 2016-08-03 NOTE — Patient Instructions (Signed)
F/u in 4 month, call if you need me before  Half phentermine daily and commit to healthy eating  Condolence and concern  Please reach out to employee health  Please work on good  health habits so that your health will improve. 1. Commitment to daily physical activity for 30 to 60  minutes, if you are able to do this.  2. Commitment to wise food choices. Aim for half of your  food intake to be vegetable and fruit, one quarter starchy foods, and one quarter protein. Try to eat on a regular schedule  3 meals per day, snacking between meals should be limited to vegetables or fruits or small portions of nuts. 64 ounces of water per day is generally recommended, unless you have specific health conditions, like heart failure or kidney failure where you will need to limit fluid intake.  3. Commitment to sufficient and a  good quality of physical and mental rest daily, generally between 6 to 8 hours per day.  WITH PERSISTANCE AND PERSEVERANCE, THE IMPOSSIBLE , BECOMES THE NORM!   Thank you  for choosing  [facility]. We consider it a privelige to serve you.  Delivering excellent health care in a caring and  compassionate way is our goal.  Partnering with you,  so that together we can achieve this goal is our strategy.

## 2016-08-04 ENCOUNTER — Telehealth: Payer: Self-pay | Admitting: Family Medicine

## 2016-08-04 ENCOUNTER — Other Ambulatory Visit (HOSPITAL_COMMUNITY)
Admission: RE | Admit: 2016-08-04 | Discharge: 2016-08-04 | Disposition: A | Discharge status: Home / Self Care | Payer: 59 | Source: Other Acute Inpatient Hospital | Attending: Family Medicine | Admitting: Family Medicine

## 2016-08-04 DIAGNOSIS — E785 Hyperlipidemia, unspecified: Secondary | ICD-10-CM

## 2016-08-04 DIAGNOSIS — E559 Vitamin D deficiency, unspecified: Secondary | ICD-10-CM

## 2016-08-04 DIAGNOSIS — R350 Frequency of micturition: Secondary | ICD-10-CM | POA: Diagnosis not present

## 2016-08-04 NOTE — Telephone Encounter (Signed)
Pls let pt know I have received and reviewd her Dec lab, cholesterol tOO HIGH, 263 with LDL of 163, explain low fat diet, needs to do this Vit D low 25, needs to commit to the dailt vit D she has on file, or with bypass may do better with weekly, if she wants that send in 6 month supply of 50, oou iU vit D3 weekly pls. Needs rept vit D and fasting lipid in 5 month also.

## 2016-08-06 ENCOUNTER — Encounter: Payer: Self-pay | Admitting: Family Medicine

## 2016-08-06 LAB — URINE CULTURE: Culture: NO GROWTH

## 2016-08-06 NOTE — Telephone Encounter (Signed)
Sent via [employer] and lab order mailed

## 2016-08-06 NOTE — Addendum Note (Signed)
Addended by: Abner GreenspanHUDY, Chevis Weisensel H on: 08/06/2016 08:36 AM   Modules accepted: Orders

## 2016-08-08 DIAGNOSIS — Z634 Disappearance and death of family member: Secondary | ICD-10-CM

## 2016-08-08 DIAGNOSIS — R350 Frequency of micturition: Secondary | ICD-10-CM | POA: Insufficient documentation

## 2016-08-08 DIAGNOSIS — F4321 Adjustment disorder with depressed mood: Secondary | ICD-10-CM | POA: Insufficient documentation

## 2016-08-08 NOTE — Progress Notes (Signed)
   Blenda Mountsamela J Devol     MRN: [id number]      DOB: [birth year]   HPI [name] is here for follow up and re-evaluation of chronic medical conditions, medication management and review of any available recent lab and radiology data.  Preventive health is updated, specifically  Cancer screening and Immunization.   Questions or concerns regarding consultations or procedures which the PT has had in the interim are  addressed. The PT denies any adverse reactions to current medications since the last visit.  Recently lost grandchild , mother had pre eclampsia and pregnancy had to be terminated, still having difficulty coping as less than 2 weeks ago, will consider therapy 5 day h/o urinary frequency, denies fever , flank pain or significant dysuria Requests short term phentermine to help with weight loss  ROS Denies recent fever or chills. Denies sinus pressure, nasal congestion, ear pain or sore throat. Denies chest congestion, productive cough or wheezing. Denies chest pains, palpitations and leg swelling Denies abdominal pain, nausea, vomiting,diarrhea or constipation.    Denies joint pain, swelling and limitation in mobility. Denies headaches, seizures, numbness, or tingling. Denies skin break down or rash.   PE  BP 118/80   Pulse 85   Resp 16   Ht 5\' 5"  (1.651 m)   Wt 196 lb (88.9 kg)   LMP 06/24/2014   SpO2 98%   BMI 32.62 kg/m   Patient alert and oriented and in no cardiopulmonary distress.  HEENT: No facial asymmetry, EOMI,   oropharynx pink and moist.  Neck supple no JVD, no mass.  Chest: Clear to auscultation bilaterally.  CVS: S1, S2 no murmurs, no S3.Regular rate.  ABD: Soft non tender.No flank or suprapubic tenderness  Ext: No edema  MS: Adequate ROM spine, shoulders, hips and knees.  Skin: Intact, no ulcerations or rash noted.  Psych: Poor eye contact, normal affect. Memory intact not anxious mildly  depressed appearing.  CNS: CN 2-12 intact, power,   normal throughout.no focal deficits noted.   Assessment & Plan  Obesity Unchanged Patient re-educated about  the importance of commitment to a  minimum of 150 minutes of exercise per week.  The importance of healthy food choices with portion control discussed. Encouraged to start a food diary, count calories and to consider  joining a support group. Sample diet sheets offered. Goals set by the patient for the next several months.   Weight /BMI 08/03/2016 06/28/2016 04/28/2016  WEIGHT 196 lb 197 lb 1.9 oz 196 lb 3.2 oz  HEIGHT 5\' 5"  5\' 5"  5\' 5"   BMI 32.62 kg/m2 32.8 kg/m2 32.65 kg/m2      Urinary frequency urinary frequency x several days, will test for infection, uA abnormal , specimen sent for culture,  Antibiotic held  Vitamin D deficiency Ongoing need for supplement  Hyperlipidemia Review of December lab elevated, needs to follow low fat diet  Abdominal pain, epigastric Controlled with zantac, has had full GI eval  Grief at loss of child Recent loss of grandchild as mother had pre eclampsia, encouraged her to seek therapy through work, considering this

## 2016-08-08 NOTE — Assessment & Plan Note (Addendum)
Unchanged Patient re-educated about  the importance of commitment to a  minimum of 150 minutes of exercise per week.  The importance of healthy food choices with portion control discussed. Encouraged to start a food diary, count calories and to consider  joining a support group. Sample diet sheets offered. Goals set by the patient for the next several months.   Weight /BMI 08/03/2016 06/28/2016 04/28/2016  WEIGHT 196 lb 197 lb 1.9 oz 196 lb 3.2 oz  HEIGHT 5\' 5"  5\' 5"  5\' 5"   BMI 32.62 kg/m2 32.8 kg/m2 32.65 kg/m2

## 2016-08-08 NOTE — Assessment & Plan Note (Signed)
Controlled with zantac, has had full GI eval

## 2016-08-08 NOTE — Assessment & Plan Note (Signed)
Recent loss of grandchild as mother had pre eclampsia, encouraged her to seek therapy through work, considering this

## 2016-08-08 NOTE — Assessment & Plan Note (Signed)
urinary frequency x several days, will test for infection, uA abnormal , specimen sent for culture,  Antibiotic held

## 2016-08-08 NOTE — Assessment & Plan Note (Signed)
Review of December lab elevated, needs to follow low fat diet

## 2016-08-08 NOTE — Assessment & Plan Note (Signed)
Ongoing need for supplement

## 2016-08-12 ENCOUNTER — Ambulatory Visit: Payer: Self-pay | Admitting: Family Medicine

## 2016-09-02 ENCOUNTER — Ambulatory Visit: Payer: Self-pay | Admitting: Physician Assistant

## 2016-09-02 ENCOUNTER — Encounter: Payer: Self-pay | Admitting: Physician Assistant

## 2016-09-02 VITALS — BP 120/80 | HR 87 | Temp 98.0°F

## 2016-09-02 DIAGNOSIS — E559 Vitamin D deficiency, unspecified: Secondary | ICD-10-CM

## 2016-09-02 DIAGNOSIS — R300 Dysuria: Secondary | ICD-10-CM

## 2016-09-02 DIAGNOSIS — N390 Urinary tract infection, site not specified: Secondary | ICD-10-CM

## 2016-09-02 LAB — POCT URINALYSIS DIPSTICK
Blood, UA: NEGATIVE
Glucose, UA: NEGATIVE
Nitrite, UA: NEGATIVE
Spec Grav, UA: 1.025
Urobilinogen, UA: 1
pH, UA: 5.5

## 2016-09-02 MED ORDER — ONDANSETRON HCL 4 MG PO TABS: 4.0000 mg | ORAL_TABLET | Freq: Three times a day (TID) | ORAL | 0 refills | Status: DC | PRN

## 2016-09-02 MED ORDER — NITROFURANTOIN MONOHYD MACRO 100 MG PO CAPS: 100.0000 mg | ORAL_CAPSULE | Freq: Two times a day (BID) | ORAL | 0 refills | Status: DC

## 2016-09-02 MED ORDER — VITAMIN D (ERGOCALCIFEROL) 1.25 MG (50000 UNIT) PO CAPS: 50000.0000 [IU] | ORAL_CAPSULE | ORAL | 3 refills | Status: DC

## 2016-09-02 MED ORDER — FLUCONAZOLE 150 MG PO TABS: 150.0000 mg | ORAL_TABLET | Freq: Once | ORAL | 0 refills | Status: AC

## 2016-09-02 NOTE — Progress Notes (Signed)
S:  C/o uti sx for 2 days, burning, urgency, frequency, low back pain for 3 days, had vomiting on Monday but is better now;  denies vaginal discharge, abdominal pain or flank pain:  Remainder ros neg, also ?if could get a rx for vit d bc her level is always low but otc isn't helping, hx of gastric sleeve  O:  Vitals wnl, nad, no cva tenderness, back nontender, lungs c t a,cv rrr, abd soft nontender, bs normal, n/v intact  A: uti  P: macrobid 100 bid x 7d, diflucan, zofran , vit d 50,000;  increase water intake, add cranberry juice, return if not improving in 2 -3 days, return earlier if worsening, discussed pyelonephritis sx

## 2016-11-25 ENCOUNTER — Telehealth: Payer: Self-pay | Admitting: Family Medicine

## 2016-11-25 NOTE — Telephone Encounter (Signed)
Patient would like to know when she is to do her labs.  She cancelled 5/22 bc she has to work that day.  Please let me know if she must be seen again prior to her physical in June.  The June appt was on a cancellation list to be rs'd.  She is now scheduled for 12/21/16 for CPE and would like to know when she can get her blood-work.

## 2016-11-26 NOTE — Telephone Encounter (Signed)
Patient aware she is to go a few days before her visit

## 2016-11-30 ENCOUNTER — Ambulatory Visit: Payer: Self-pay | Admitting: Family Medicine

## 2016-12-16 ENCOUNTER — Encounter: Payer: Self-pay | Admitting: *Deleted

## 2016-12-16 ENCOUNTER — Ambulatory Visit (INDEPENDENT_AMBULATORY_CARE_PROVIDER_SITE_OTHER)
Admit: 2016-12-16 | Discharge: 2016-12-16 | Disposition: A | Discharge status: Home / Self Care | Payer: 59 | Attending: Family Medicine | Admitting: Family Medicine

## 2016-12-16 ENCOUNTER — Ambulatory Visit
Admission: EM | Admit: 2016-12-16 | Discharge: 2016-12-16 | Disposition: A | Discharge status: Home / Self Care | Payer: 59 | Attending: Family Medicine | Admitting: Family Medicine

## 2016-12-16 DIAGNOSIS — R1030 Lower abdominal pain, unspecified: Secondary | ICD-10-CM | POA: Diagnosis not present

## 2016-12-16 DIAGNOSIS — R82998 Other abnormal findings in urine: Secondary | ICD-10-CM

## 2016-12-16 DIAGNOSIS — R1031 Right lower quadrant pain: Secondary | ICD-10-CM

## 2016-12-16 DIAGNOSIS — N3091 Cystitis, unspecified with hematuria: Secondary | ICD-10-CM

## 2016-12-16 DIAGNOSIS — R300 Dysuria: Secondary | ICD-10-CM | POA: Diagnosis not present

## 2016-12-16 DIAGNOSIS — R310 Gross hematuria: Secondary | ICD-10-CM

## 2016-12-16 DIAGNOSIS — N200 Calculus of kidney: Secondary | ICD-10-CM | POA: Diagnosis not present

## 2016-12-16 LAB — URINALYSIS, COMPLETE (UACMP) WITH MICROSCOPIC
Glucose, UA: NEGATIVE mg/dL
Ketones, ur: 15 mg/dL — AB
Nitrite: NEGATIVE
Protein, ur: 100 mg/dL — AB
Specific Gravity, Urine: 1.025 (ref 1.005–1.030)
pH: 5.5 (ref 5.0–8.0)

## 2016-12-16 MED ORDER — FLUCONAZOLE 150 MG PO TABS: 150.0000 mg | ORAL_TABLET | Freq: Once | ORAL | 0 refills | Status: AC

## 2016-12-16 MED ORDER — PHENAZOPYRIDINE HCL 200 MG PO TABS: 200.0000 mg | ORAL_TABLET | Freq: Three times a day (TID) | ORAL | 0 refills | Status: DC | PRN

## 2016-12-16 MED ORDER — NITROFURANTOIN MONOHYD MACRO 100 MG PO CAPS: 100.0000 mg | ORAL_CAPSULE | Freq: Two times a day (BID) | ORAL | 0 refills | Status: DC

## 2016-12-16 NOTE — ED Triage Notes (Signed)
Dysuria, hematuria, suprapubic abd pain, flank pain, onset- today.

## 2016-12-16 NOTE — Discharge Instructions (Signed)
Please follow-up with PCP special for the hematuria and lipid particles found in the urine. I do recommend repeat urine anywhere from 1-4 weeks to have proof that the urine has cleared the blood and clear the fat from her urine.

## 2016-12-16 NOTE — ED Provider Notes (Signed)
[facility]    CSN: [id number] Arrival date & time: 12/16/16  1139     History   Chief Complaint Chief Complaint  Patient presents with  . Abdominal Pain  . Hematuria  . Dysuria    HPI Kristy Travis is a 51 y.o. female.   Patient is a 51 year old Afro-American female who presents with hematuria right lower abdominal pain and deep back pain. She states everything started Tuesday as far as the symptoms. She states since noticed gross blood yesterday and worse today. She's had a history of UTIs but also kidney stones. Paranoid about 2 years ago she had a stone salt on ultrasound that was about 6 mm. She states that the last ultrasound she had did not show show the stone but she never thought she passed the stone. Because the report pedal sound was negative nothing was done but she feels is from the stone she's had recurrent UTIs other problems. Parents she's been worked up with the urologist as well but not happy with some the findings. She's had a gastric sling sat cholecystectomy and a total abdominal hysterectomy. She's had a history of hyperlipidemia chronic fatigue and joint pain. Father diabetes pulmonary hypertension. She's never smoked. She is allergic to [facility]. She reports frequency and burning with urination but the pain comes and goes and using the pain is only present when she is trying to urinate.   The history is provided by the patient. No language interpreter was used.  Abdominal Pain  Pain location:  RLQ Pain quality: aching, cramping, pressure, sharp and stabbing   Pain radiates to:  Groin and RLQ Pain severity:  Moderate Chronicity:  New Context: previous surgery   Relieved by:  Nothing Worsened by:  Nothing Ineffective treatments:  None tried Associated symptoms: dysuria and hematuria   Hematuria  Associated symptoms include abdominal pain.  Dysuria  Associated symptoms: abdominal pain     Past Medical History:  Diagnosis Date  .  Arthritis   . Carpal tunnel syndrome   . Chronic fatigue   . GERD (gastroesophageal reflux disease)   . Hypertension   . Obesity   . Pancreatitis 07/2014   had  gall bladder removed , spent 1 week in hospital  . PONV (postoperative nausea and vomiting)     Patient Active Problem List   Diagnosis Date Noted  . Urinary frequency 08/08/2016  . Grief at loss of child 08/08/2016  . Abdominal pain, epigastric 03/20/2015  . Seasonal allergies 04/04/2012  . Vitamin D deficiency 08/27/2011  . PAIN IN JOINT, MULTIPLE SITES 09/02/2010  . Metabolic syndrome X 09/02/2010  . Hyperlipidemia 02/06/2010  . BACK PAIN, LUMBAR 01/15/2008  . Obesity 12/21/2007    Past Surgical History:  Procedure Laterality Date  . 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 CHOLECYSTECTOMY  07/2014  . cyst removed from left foot and bone removed from left foot  April and June 2010  . DILATION AND CURETTAGE OF UTERUS    . ESOPHAGOGASTRODUODENOSCOPY N/A 03/03/2016   Procedure: ESOPHAGOGASTRODUODENOSCOPY (EGD);  Surgeon: Malissa HippoNajeeb U Rehman, MD;  Location: [facility];  Service: Endoscopy;  Laterality: N/A;  3:00 - moved to 2:00 - Ann to notify pt  . laparoscopy to eval dyspepsia  2004  . TOTAL VAGINAL HYSTERECTOMY  06/2014   with BSO    OB History    Gravida Para Term Preterm AB Living   3 2 2   1 2    SAB TAB Ectopic Multiple  Live Births   1               Home Medications    Prior to Admission medications   Medication Sig Start Date End Date Taking? Authorizing Provider  cholecalciferol (VITAMIN D) 1000 units tablet Take 1 tablet (1,000 Units total) by mouth daily. 06/28/16  Yes Kerri Perches, MD  pantoprazole (PROTONIX) 40 MG tablet Take 1 tablet (40 mg total) by mouth 2 (two) times daily before a meal. 03/03/16  Yes Rehman, Joline Maxcy, MD  ranitidine (ZANTAC) 150 MG tablet Take 150 mg by mouth 2 (two) times daily.   Yes [provider]  fluconazole (DIFLUCAN) 150 MG tablet Take 1 tablet  (150 mg total) by mouth once. 12/16/16 12/16/16  Hassan Rowan, MD  nitrofurantoin, macrocrystal-monohydrate, (MACROBID) 100 MG capsule Take 1 capsule (100 mg total) by mouth 2 (two) times daily. 09/02/16   Sherrie Mustache Roselyn Bering, PA-C  nitrofurantoin, macrocrystal-monohydrate, (MACROBID) 100 MG capsule Take 1 capsule (100 mg total) by mouth 2 (two) times daily. 12/16/16   Hassan Rowan, MD  ondansetron (ZOFRAN) 4 MG tablet Take 1 tablet (4 mg total) by mouth every 8 (eight) hours as needed for nausea or vomiting. 09/02/16   Sherrie Mustache Roselyn Bering, PA-C  phenazopyridine (PYRIDIUM) 200 MG tablet Take 1 tablet (200 mg total) by mouth 3 (three) times daily as needed for pain. 12/16/16   Hassan Rowan, MD  phentermine (ADIPEX-P) 37.5 MG tablet Take 1 tablet (37.5 mg total) by mouth daily before breakfast. 08/03/16   Kerri Perches, MD  Vitamin D, Ergocalciferol, (DRISDOL) 50000 units CAPS capsule Take 1 capsule (50,000 Units total) by mouth every 7 (seven) days. 09/02/16   Faythe Ghee, PA-C    Family History Family History  Problem Relation Age of Onset  . Thrombosis Mother   . Diabetes Father   . Pulmonary Hypertension Father   . Breast cancer Neg Hx     Social History Social History  Substance Use Topics  . Smoking status: Never Smoker  . Smokeless tobacco: Never Used  . Alcohol use Yes     Comment: occasionally     Allergies   Maxzide [triamterene-hctz]   Review of Systems Review of Systems  Gastrointestinal: Positive for abdominal pain.  Genitourinary: Positive for dysuria and hematuria.  All other systems reviewed and are negative.    Physical Exam Triage Vital Signs ED Triage Vitals  Enc Vitals Group     BP 12/16/16 1155 119/70     Pulse Rate 12/16/16 1155 80     Resp 12/16/16 1155 16     Temp 12/16/16 1155 98.1 F (36.7 C)     Temp Source 12/16/16 1155 Oral     SpO2 12/16/16 1155 100 %     Weight 12/16/16 1157 191 lb (86.6 kg)     Height 12/16/16 1157 5\' 5"  (1.651 m)     Head  Circumference --      Peak Flow --      Pain Score --      Pain Loc --      Pain Edu? --      Excl. in GC? --    No data found.   Updated Vital Signs BP 119/70 (BP Location: Left Arm)   Pulse 80   Temp 98.1 F (36.7 C) (Oral)   Resp 16   Ht 5\' 5"  (1.651 m)   Wt 191 lb (86.6 kg)   LMP 06/24/2014   SpO2 100%  BMI 31.78 kg/m   Visual Acuity Right Eye Distance:   Left Eye Distance:   Bilateral Distance:    Right Eye Near:   Left Eye Near:    Bilateral Near:     Physical Exam  Constitutional: She is oriented to person, place, and time. She appears well-developed and well-nourished.  HENT:  Head: Normocephalic and atraumatic.  Right Ear: External ear normal.  Left Ear: External ear normal.  Mouth/Throat: Oropharynx is clear and moist.  Eyes: Conjunctivae and EOM are normal. Pupils are equal, round, and reactive to light.  Neck: Normal range of motion. Neck supple.  Cardiovascular: Normal rate, regular rhythm and normal heart sounds.   Pulmonary/Chest: Effort normal and breath sounds normal.  Abdominal: Soft.  Musculoskeletal: Normal range of motion. She exhibits no edema or deformity.  Neurological: She is alert and oriented to person, place, and time. No cranial nerve deficit.  Skin: Skin is warm.  Psychiatric: She has a normal mood and affect.  Vitals reviewed.    UC Treatments / Results  Labs (all labs ordered are listed, but only abnormal results are displayed) Labs Reviewed  URINALYSIS, COMPLETE (UACMP) WITH MICROSCOPIC - Abnormal; Notable for the following:       Result Value   Color, Urine BROWN (*)    APPearance TURBID (*)    Hgb urine dipstick LARGE (*)    Bilirubin Urine SMALL (*)    Ketones, ur 15 (*)    Protein, ur 100 (*)    Leukocytes, UA SMALL (*)    Squamous Epithelial / LPF 0-5 (*)    Bacteria, UA FEW (*)    All other components within normal limits  URINE CULTURE    EKG  EKG Interpretation None       Radiology Ct Renal Stone  Study  Result Date: 12/16/2016 CLINICAL DATA:  Dysuria with gross hematuria and right suprapubic pain. Flank pain for 2 days. History of kidney stones. EXAM: CT ABDOMEN AND PELVIS WITHOUT CONTRAST TECHNIQUE: Multidetector CT imaging of the abdomen and pelvis was performed following the standard protocol without IV contrast. COMPARISON:  None. FINDINGS: Lower chest:  Small hiatal hernia. Hepatobiliary: 6 mm hypoattenuating lesion medial segment left liver is too small to characterize but most likely is a tiny cyst. Liver parenchyma otherwise unremarkable noncontrast appearance. Gallbladder surgically absent. No intrahepatic or extrahepatic biliary dilation. Pancreas: No focal mass lesion. No dilatation of the main duct. No intraparenchymal cyst. No peripancreatic edema. Spleen: No splenomegaly. No focal mass lesion. Adrenals/Urinary Tract: No adrenal nodule or mass. 3 mm nonobstructing stone identified lower pole right kidney. No left renal stones. No hydronephrosis or secondary change in either kidney. No evidence for hydroureter. The urinary bladder appears normal for the degree of distention. Stomach/Bowel: Suture line along the greater curvature of the stomach is compatible with the reported history of previous sleeve gastrectomy. Duodenum is normally positioned as is the ligament of Treitz. No small bowel wall thickening. No small bowel dilatation. The terminal ileum is normal. The appendix is normal. No gross colonic mass. No colonic wall thickening. No substantial diverticular change. Vascular/Lymphatic: No abdominal aortic aneurysm. No abdominal aortic atherosclerotic calcification. There is no gastrohepatic or hepatoduodenal ligament lymphadenopathy. No intraperitoneal or retroperitoneal lymphadenopathy. No pelvic sidewall lymphadenopathy. Reproductive: Uterus surgically absent.  There is no adnexal mass. Other: No intraperitoneal free fluid. Musculoskeletal: Bone windows reveal no worrisome lytic or  sclerotic osseous lesions. IMPRESSION: 1. 3 mm nonobstructing stone identified lower pole right kidney. No secondary changes in  either kidney or ureter. No ureteral or bladder stones. Otherwise no acute findings in the abdomen or pelvis. 2. Status post sleeve gastrectomy with small hiatal hernia. Electronically Signed   By: Kennith Center M.D.   On: 12/16/2016 13:36    Procedures Procedures (including critical care time)  Medications Ordered in UC Medications - No data to display   Results for orders placed or performed during the hospital encounter of 12/16/16  Urinalysis, Complete w Microscopic  Result Value Ref Range   Color, Urine BROWN (A) YELLOW   APPearance TURBID (A) CLEAR   Specific Gravity, Urine 1.025 1.005 - 1.030   pH 5.5 5.0 - 8.0   Glucose, UA NEGATIVE NEGATIVE mg/dL   Hgb urine dipstick LARGE (A) NEGATIVE   Bilirubin Urine SMALL (A) NEGATIVE   Ketones, ur 15 (A) NEGATIVE mg/dL   Protein, ur 161 (A) NEGATIVE mg/dL   Nitrite NEGATIVE NEGATIVE   Leukocytes, UA SMALL (A) NEGATIVE   Squamous Epithelial / LPF 0-5 (A) NONE SEEN   WBC, UA 6-30 0 - 5 WBC/hpf   RBC / HPF TOO NUMEROUS TO COUNT 0 - 5 RBC/hpf   Bacteria, UA FEW (A) NONE SEEN   Oval Fat Body PRESENT    Initial Impression / Assessment and Plan / UC Course  I have reviewed the triage vital signs and the nursing notes.  Pertinent labs & imaging results that were available during my care of the patient were reviewed by me and considered in my medical decision making (see chart for details).    We will obtain a CT scan form of a stone study to evaluate her for a kidney stone   Informed patient no signs of obstructing stone present. Will treat for hemorrhagic cystitis with Macrobid since patient's 5.500 mg twice a day for 10 days also treat with Pyridium 200 mg 1 tablet 3 times a day and Diflucan as well. I recommended strongly that she follow-up with the PCP repeat urine anywhere between 1-4 weeks to make sure that  the blood is cleared and urine and that drop is seen urine have also created as well. Later this could just be coming from hemorrhagic cystitis is warm make sure that she does not have some type of glomerulonephritis as well.   We'll give her a work note for today and tomorrow.     Final Clinical Impressions(s) / UC Diagnoses   Final diagnoses:  Hemorrhagic cystitis  Gross hematuria  Right lower quadrant abdominal pain  Lipiduria    New Prescriptions New Prescriptions   FLUCONAZOLE (DIFLUCAN) 150 MG TABLET    Take 1 tablet (150 mg total) by mouth once.   NITROFURANTOIN, MACROCRYSTAL-MONOHYDRATE, (MACROBID) 100 MG CAPSULE    Take 1 capsule (100 mg total) by mouth 2 (two) times daily.   PHENAZOPYRIDINE (PYRIDIUM) 200 MG TABLET    Take 1 tablet (200 mg total) by mouth 3 (three) times daily as needed for pain.    Note: This dictation was prepared with Dragon dictation along with smaller phrase technology. Any transcriptional errors that result from this process are unintentional.    Hassan Rowan, MD 12/16/16 1414

## 2016-12-16 NOTE — ED Notes (Signed)
No prior auth needed [facility], JWJ:XBJYNRep:Linda A.

## 2016-12-18 LAB — URINE CULTURE
Culture: NO GROWTH
Special Requests: NORMAL

## 2016-12-21 ENCOUNTER — Telehealth (INDEPENDENT_AMBULATORY_CARE_PROVIDER_SITE_OTHER): Payer: Self-pay | Admitting: Internal Medicine

## 2016-12-21 ENCOUNTER — Other Ambulatory Visit
Admission: RE | Admit: 2016-12-21 | Discharge: 2016-12-21 | Disposition: A | Discharge status: Home / Self Care | Payer: 59 | Source: Ambulatory Visit | Attending: Family Medicine | Admitting: Family Medicine

## 2016-12-21 ENCOUNTER — Encounter: Payer: Self-pay | Admitting: Family Medicine

## 2016-12-21 ENCOUNTER — Ambulatory Visit (INDEPENDENT_AMBULATORY_CARE_PROVIDER_SITE_OTHER): Payer: 59 | Admitting: Family Medicine

## 2016-12-21 ENCOUNTER — Telehealth: Payer: Self-pay

## 2016-12-21 VITALS — BP 120/80 | HR 91 | Resp 16 | Ht 65.0 in | Wt 193.0 lb

## 2016-12-21 DIAGNOSIS — G9332 Myalgic encephalomyelitis/chronic fatigue syndrome: Secondary | ICD-10-CM

## 2016-12-21 DIAGNOSIS — Z09 Encounter for follow-up examination after completed treatment for conditions other than malignant neoplasm: Secondary | ICD-10-CM | POA: Diagnosis not present

## 2016-12-21 DIAGNOSIS — E785 Hyperlipidemia, unspecified: Secondary | ICD-10-CM | POA: Diagnosis not present

## 2016-12-21 DIAGNOSIS — N200 Calculus of kidney: Secondary | ICD-10-CM | POA: Diagnosis not present

## 2016-12-21 DIAGNOSIS — K219 Gastro-esophageal reflux disease without esophagitis: Secondary | ICD-10-CM | POA: Diagnosis not present

## 2016-12-21 DIAGNOSIS — E7849 Other hyperlipidemia: Secondary | ICD-10-CM

## 2016-12-21 DIAGNOSIS — E559 Vitamin D deficiency, unspecified: Secondary | ICD-10-CM | POA: Diagnosis not present

## 2016-12-21 DIAGNOSIS — R5382 Chronic fatigue, unspecified: Secondary | ICD-10-CM | POA: Diagnosis not present

## 2016-12-21 DIAGNOSIS — E784 Other hyperlipidemia: Secondary | ICD-10-CM | POA: Diagnosis not present

## 2016-12-21 HISTORY — DX: Calculus of kidney: N20.0

## 2016-12-21 LAB — LIPID PANEL
Cholesterol: 258 mg/dL — ABNORMAL HIGH (ref 0–200)
HDL: 78 mg/dL (ref >40)
LDL Cholesterol: 163 mg/dL — ABNORMAL HIGH (ref 0–99)
Total CHOL/HDL Ratio: 3.3 RATIO
Triglycerides: 84 mg/dL (ref <150)
VLDL: 17 mg/dL (ref 0–40)

## 2016-12-21 MED ORDER — AMPHETAMINE-DEXTROAMPHET ER 20 MG PO CP24: 20.0000 mg | ORAL_CAPSULE | Freq: Every day | ORAL | 0 refills | Status: DC

## 2016-12-21 MED ORDER — HYDROCODONE-ACETAMINOPHEN 5-325 MG PO TABS: ORAL_TABLET | ORAL | 0 refills | Status: DC

## 2016-12-21 MED ORDER — AMPHETAMINE-DEXTROAMPHETAMINE 20 MG PO TABS: 20.0000 mg | ORAL_TABLET | Freq: Every day | ORAL | 0 refills | Status: DC

## 2016-12-21 MED ORDER — PANTOPRAZOLE SODIUM 40 MG PO TBEC: 40.0000 mg | DELAYED_RELEASE_TABLET | Freq: Two times a day (BID) | ORAL | 3 refills | Status: DC

## 2016-12-21 NOTE — Telephone Encounter (Signed)
Patient presented to the office requesting a refill for Pantoprazole 40mg .  [phone]

## 2016-12-21 NOTE — Patient Instructions (Addendum)
Annual physical July 3 or after call if you need me sooner  You are being referred for management of kidney stone to urologist  You do not have a urinary tract infection, so stop antibiotic  Ten day supply of pain med for night time use only is prescribed  Phentermine risk outweights benefit, since only 3 pound loss, continue plant based diet  And exercise commitment, no more phentermine  New for chronic fatigue is once daily aderal  You need to call GI and sched appt for f/u GERD

## 2016-12-21 NOTE — Assessment & Plan Note (Addendum)
Presented to urgent care on 6/7 , requests f/u with urologist will refer, still experioencing right flank pain rated at a 6

## 2016-12-21 NOTE — Telephone Encounter (Signed)
Rx sent to her pharmacy 

## 2016-12-22 ENCOUNTER — Encounter: Payer: Self-pay | Admitting: Family Medicine

## 2016-12-22 DIAGNOSIS — R5382 Chronic fatigue, unspecified: Secondary | ICD-10-CM | POA: Insufficient documentation

## 2016-12-22 DIAGNOSIS — G9332 Myalgic encephalomyelitis/chronic fatigue syndrome: Secondary | ICD-10-CM | POA: Insufficient documentation

## 2016-12-22 DIAGNOSIS — Z09 Encounter for follow-up examination after completed treatment for conditions other than malignant neoplasm: Secondary | ICD-10-CM | POA: Insufficient documentation

## 2016-12-22 LAB — VITAMIN D 25 HYDROXY (VIT D DEFICIENCY, FRACTURES): Vit D, 25-Hydroxy: 25 ng/mL — ABNORMAL LOW (ref 30.0–100.0)

## 2016-12-22 NOTE — Assessment & Plan Note (Signed)
Remains symptomatic , urology referral placed, limited quantity of hydrocodone for bedtime use as needed only

## 2016-12-22 NOTE — Progress Notes (Signed)
   Kristy Travis     MRN: [id number]      DOB: July 10, 1966   HPI Kristy Travis is here for follow up from recent urgent care visit for renal colic seen on 7/8/29566/01/2017 with dx of renal colic , small non obstructing right renal stone and also dx of UTI, urine culture subsequently negative. Still reports pain in right flank at an 8 Needs refill on high dose  GERD med and f/u with GI is past due Has chronic fatigue and normal sleep study, trial of adderral appropriate  ROS See HPI  Denies recent fever or chills. Denies sinus pressure, nasal congestion, ear pain or sore throat. Denies chest congestion, productive cough or wheezing. Denies chest pains, palpitations and leg swelling . Denies headaches, seizures, numbness, or tingling. Denies depression, anxiety or insomnia. Denies skin break down or rash.   PE  BP 120/80   Pulse 91   Resp 16   Ht 5\' 5"  (1.651 m)   Wt 193 lb (87.5 kg)   LMP 06/24/2014   SpO2 100%   BMI 32.12 kg/m   Patient alert and oriented and in no cardiopulmonary distress.  HEENT: No facial asymmetry, EOMI,   oropharynx pink and moist.  Neck supple no JVD, no mass.  Chest: Clear to auscultation bilaterally.  CVS: S1, S2 no murmurs, no S3.Regular rate.  ABD: Soft non tender.   Ext: No edema  MS: Adequate ROM spine, shoulders, hips and knees. Tender over right flank Skin: Intact, no ulcerations or rash noted.  Psych: Good eye contact, normal affect. Memory intact not anxious or depressed appearing.  CNS: CN 2-12 intact, power,  normal throughout.no focal deficits noted.   Assessment & Plan  Kidney stone on right side Presented to urgent care on 6/7 , requests f/u with urologist will refer, still experioencing right flank pain rated at a 6  Obesity Deteriorated. Patient re-educated about  the importance of commitment to a  minimum of 150 minutes of exercise per week.  The importance of healthy food choices with portion control  discussed. Encouraged to start a food diary, count calories and to consider  joining a support group. Sample diet sheets offered. Goals set by the patient for the next several months.   Weight /BMI 12/21/2016 12/16/2016 08/03/2016  WEIGHT 193 lb 191 lb 196 lb  HEIGHT 5\' 5"  5\' 5"  5\' 5"   BMI 32.12 kg/m2 31.78 kg/m2 32.62 kg/m2   Discontinue phentermine , no benefit   Vitamin D deficiency unchanged needs to commit to regularly taking med prescribed  Hyperlipidemia Hyperlipidemia:Low fat diet discussed and encouraged.   Lipid Panel  Lab Results  Component Value Date   CHOL 258 (H) 12/21/2016   HDL 78 12/21/2016   LDLCALC 163 (H) 12/21/2016   TRIG 84 12/21/2016   CHOLHDL 3.3 12/21/2016   Deteriorated Dietary change required to lower risk of vascular disease    Encounter for examination following treatment at hospital Remains symptomatic , urology referral placed, limited quantity of hydrocodone for bedtime use as needed only  Chronic fatigue disorder Pt does not have sleep apnea and isa not depressed, she does have chronic fatigue Start adder all and f/u in 2 monhts

## 2016-12-22 NOTE — Assessment & Plan Note (Signed)
Pt does not have sleep apnea and isa not depressed, she does have chronic fatigue Start adder all and f/u in 2 monhts

## 2016-12-22 NOTE — Assessment & Plan Note (Signed)
Hyperlipidemia:Low fat diet discussed and encouraged.   Lipid Panel  Lab Results  Component Value Date   CHOL 258 (H) 12/21/2016   HDL 78 12/21/2016   LDLCALC 163 (H) 12/21/2016   TRIG 84 12/21/2016   CHOLHDL 3.3 12/21/2016   Deteriorated Dietary change required to lower risk of vascular disease

## 2016-12-22 NOTE — Assessment & Plan Note (Signed)
unchanged needs to commit to regularly taking med prescribed

## 2016-12-22 NOTE — Assessment & Plan Note (Signed)
Deteriorated. Patient re-educated about  the importance of commitment to a  minimum of 150 minutes of exercise per week.  The importance of healthy food choices with portion control discussed. Encouraged to start a food diary, count calories and to consider  joining a support group. Sample diet sheets offered. Goals set by the patient for the next several months.   Weight /BMI 12/21/2016 12/16/2016 08/03/2016  WEIGHT 193 lb 191 lb 196 lb  HEIGHT 5\' 5"  5\' 5"  5\' 5"   BMI 32.12 kg/m2 31.78 kg/m2 32.62 kg/m2   Discontinue phentermine , no benefit

## 2016-12-29 ENCOUNTER — Encounter: Payer: Self-pay | Admitting: Family Medicine

## 2016-12-29 NOTE — Telephone Encounter (Signed)
error 

## 2016-12-30 ENCOUNTER — Encounter: Payer: Self-pay | Admitting: Urology

## 2016-12-30 ENCOUNTER — Ambulatory Visit (INDEPENDENT_AMBULATORY_CARE_PROVIDER_SITE_OTHER): Payer: 59 | Admitting: Urology

## 2016-12-30 DIAGNOSIS — R310 Gross hematuria: Secondary | ICD-10-CM

## 2016-12-30 DIAGNOSIS — N200 Calculus of kidney: Secondary | ICD-10-CM

## 2016-12-30 LAB — MICROSCOPIC EXAMINATION
Bacteria, UA: NONE SEEN
RBC, UA: NONE SEEN /hpf (ref 0–?)

## 2016-12-30 LAB — URINALYSIS, COMPLETE
Bilirubin, UA: NEGATIVE
Glucose, UA: NEGATIVE
Ketones, UA: NEGATIVE
Nitrite, UA: NEGATIVE
Protein, UA: NEGATIVE
RBC, UA: NEGATIVE
Specific Gravity, UA: 1.025 (ref 1.005–1.030)
Urobilinogen, Ur: 0.2 mg/dL (ref 0.2–1.0)
pH, UA: 6 (ref 5.0–7.5)

## 2016-12-30 NOTE — Progress Notes (Signed)
12/30/2016 10:03 AM   Kristy Travis 06/02/1966 [id number]  Referring provider: Kerri Perches, MD [address], [address] [place], [place] [address]  Chief Complaint  Patient presents with  . Nephrolithiasis    HPI: The patient is a 51 year old. Presents for follow-up of a 2 mm low pole right renal stone seen in the emergency department.  This CT was ordered due to right flank pain and gross hematuria. She does have a history of nephrolithiasis. She'll urine culture this time which was also negative. She continues to have right lower back and flank pain. Laying down and bending over makes it worse. She does not describe it as colicky in nature. She also noted gross hematuria which is since resolved. She had gross hematuria a few weeks ago when she went to the employee health clinic. Prior to this, she has never seen gross hematuria. She has had nephrolithiasis a few times prior to this. She was able to pass them spontaneously. She has never had surgery for stones.  Review of her imaging and CareEverywhere, this stone in her right lower pole has been there since at least a 2009 study at [facility] showing it to be the same size and in the same location.   PMH: Past Medical History:  Diagnosis Date  . Arthritis   . Carpal tunnel syndrome   . Chronic fatigue   . GERD (gastroesophageal reflux disease)   . Hypertension   . Obesity   . Pancreatitis 07/2014   had  gall bladder removed , spent 1 week in hospital  . PONV (postoperative nausea and vomiting)     Surgical History: Past Surgical History:  Procedure Laterality Date  . 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 CHOLECYSTECTOMY  07/2014  . cyst removed from left foot and bone removed from left foot  April and June 2010  . DILATION AND CURETTAGE OF UTERUS    . ESOPHAGOGASTRODUODENOSCOPY N/A 03/03/2016   Procedure: ESOPHAGOGASTRODUODENOSCOPY (EGD);  Surgeon: Malissa Hippo, MD;  [facility];  Service:  Endoscopy;  Laterality: N/A;  3:00 - moved to 2:00 - Ann to notify pt  . laparoscopy to eval dyspepsia  2004  . TOTAL VAGINAL HYSTERECTOMY  06/2014   with BSO    Home Medications:  Allergies as of 12/30/2016      Reactions   Maxzide [triamterene-hctz] Nausea Only      Medication List       Accurate as of 12/30/16 10:03 AM. Always use your most recent med list.          amphetamine-dextroamphetamine 20 MG 24 hr capsule Commonly known as:  ADDERALL XR Take 1 capsule (20 mg total) by mouth daily.   amphetamine-dextroamphetamine 20 MG tablet Commonly known as:  ADDERALL Take 1 tablet (20 mg total) by mouth daily. Start taking on:  01/18/2017   cholecalciferol 1000 units tablet Commonly known as:  VITAMIN D Take 1 tablet (1,000 Units total) by mouth daily.   HYDROcodone-acetaminophen 5-325 MG tablet Commonly known as:  NORCO/VICODIN One at bedtime as needed for uncontrolled pain   ondansetron 4 MG tablet Commonly known as:  ZOFRAN Take 1 tablet (4 mg total) by mouth every 8 (eight) hours as needed for nausea or vomiting.   pantoprazole 40 MG tablet Commonly known as:  PROTONIX Take 1 tablet (40 mg total) by mouth 2 (two) times daily before a meal.   ranitidine 150 MG tablet Commonly known as:  ZANTAC Take 150  mg by mouth 2 (two) times daily.   Vitamin D (Ergocalciferol) 50000 units Caps capsule Commonly known as:  DRISDOL Take 1 capsule (50,000 Units total) by mouth every 7 (seven) days.       Allergies:  Allergies  Allergen Reactions  . Maxzide [Triamterene-Hctz] Nausea Only    Family History: Family History  Problem Relation Age of Onset  . Thrombosis Mother   . Diabetes Father   . Pulmonary Hypertension Father   . Breast cancer Neg Hx   . Bladder Cancer Neg Hx   . Kidney cancer Neg Hx     Social History:  reports that she has never smoked. She has never used smokeless tobacco. She reports that she drinks alcohol. She reports that she does not use  drugs.  ROS: UROLOGY Frequent Urination?: Yes Hard to postpone urination?: No Burning/pain with urination?: Yes Get up at night to urinate?: Yes Leakage of urine?: No Urine stream starts and stops?: No Trouble starting stream?: No Do you have to strain to urinate?: No Blood in urine?: Yes Urinary tract infection?: Yes Sexually transmitted disease?: No Injury to kidneys or bladder?: No Painful intercourse?: No Weak stream?: No Currently pregnant?: No Vaginal bleeding?: No Last menstrual period?: n  Gastrointestinal Nausea?: No Vomiting?: No Indigestion/heartburn?: Yes Diarrhea?: No Constipation?: No  Constitutional Fever: No Night sweats?: No Weight loss?: No Fatigue?: Yes  Skin Skin rash/lesions?: Yes Itching?: Yes  Eyes Blurred vision?: No Double vision?: No  Ears/Nose/Throat Sore throat?: No Sinus problems?: Yes  Hematologic/Lymphatic Swollen glands?: No Easy bruising?: No  Cardiovascular Leg swelling?: No Chest pain?: No  Respiratory Cough?: No Shortness of breath?: No  Endocrine Excessive thirst?: No  Musculoskeletal Back pain?: Yes Joint pain?: No  Neurological Headaches?: No Dizziness?: No  Psychologic Depression?: No Anxiety?: No  Physical Exam: LMP 06/24/2014   Constitutional:  Alert and oriented, No acute distress. HEENT: [place] AT, moist mucus membranes.  Trachea midline, no masses. Cardiovascular: No clubbing, cyanosis, or edema. Respiratory: Normal respiratory effort, no increased work of breathing. GI: Abdomen is soft, nontender, nondistended, no abdominal masses GU: No CVA tenderness.  Skin: No rashes, bruises or suspicious lesions. Lymph: No cervical or inguinal adenopathy. Neurologic: Grossly intact, no focal deficits, moving all 4 extremities. Psychiatric: Normal mood and affect.  Laboratory Data: Lab Results  Component Value Date   WBC 7.3 06/17/2015   HGB 12.6 06/17/2015   HCT 39.5 06/17/2015   MCV 80.5  06/17/2015   PLT 259 06/17/2015    Lab Results  Component Value Date   CREATININE 0.62 12/24/2015    No results found for: PSA  No results found for: TESTOSTERONE  Lab Results  Component Value Date   HGBA1C 6.2 08/16/2013    Urinalysis    Component Value Date/Time   COLORURINE BROWN (A) 12/16/2016 1156   APPEARANCEUR TURBID (A) 12/16/2016 1156   APPEARANCEUR Clear 08/01/2014 2204   LABSPEC 1.025 12/16/2016 1156   LABSPEC 1.020 08/01/2014 2204   PHURINE 5.5 12/16/2016 1156   GLUCOSEU NEGATIVE 12/16/2016 1156   GLUCOSEU Negative 08/01/2014 2204   HGBUR LARGE (A) 12/16/2016 1156   HGBUR negative 10/06/2009 1407   BILIRUBINUR SMALL (A) 12/16/2016 1156   BILIRUBINUR 1+ 09/02/2016 0853   BILIRUBINUR Negative 08/01/2014 2204   KETONESUR 15 (A) 12/16/2016 1156   PROTEINUR 100 (A) 12/16/2016 1156   UROBILINOGEN 1.0 09/02/2016 0853   UROBILINOGEN 0.2 09/10/2010 1231   NITRITE NEGATIVE 12/16/2016 1156   LEUKOCYTESUR SMALL (A) 12/16/2016 1156   LEUKOCYTESUR  Negative 08/01/2014 2204    Pertinent Imaging: CT reviewed as above  Assessment & Plan:    1. 2 mm right nonobstructing renal calculus 2. Gross hematuria I discussed with the patient that her stone on the right side has been there unchanged for at least 9 years, and it does not explain her right flank pain. I do think that her right flank pain is non-urological in origin. We did discuss however that she did have gross hematuria without apparent source which is concerning. Her CT was unremarkable and urine culture was negative.  She was recommended to undergo cystoscopy. We'll arrange for her to undergo this procedure in the near future.  Return for cystoscopy.  Hildred LaserBrian James Mahamed Zalewski, MD  [facility] [address], [address] [place], [place] [address] [phone]

## 2017-01-04 ENCOUNTER — Other Ambulatory Visit: Payer: 59 | Admitting: Urology

## 2017-01-04 ENCOUNTER — Encounter: Payer: Self-pay | Admitting: Urology

## 2017-01-11 ENCOUNTER — Encounter: Payer: Self-pay | Admitting: Family Medicine

## 2017-01-11 ENCOUNTER — Ambulatory Visit (INDEPENDENT_AMBULATORY_CARE_PROVIDER_SITE_OTHER): Payer: 59 | Admitting: Family Medicine

## 2017-01-11 VITALS — BP 118/72 | HR 87 | Temp 96.6°F | Resp 16 | Ht 65.0 in | Wt 198.8 lb

## 2017-01-11 DIAGNOSIS — Z0000 Encounter for general adult medical examination without abnormal findings: Secondary | ICD-10-CM | POA: Diagnosis not present

## 2017-01-11 DIAGNOSIS — Z1211 Encounter for screening for malignant neoplasm of colon: Secondary | ICD-10-CM | POA: Diagnosis not present

## 2017-01-11 LAB — HEMOCCULT GUIAC POC 1CARD (OFFICE): Fecal Occult Blood, POC: NEGATIVE

## 2017-01-11 NOTE — Patient Instructions (Addendum)
F/u in 4 months , call if you need me before  Pls reduce fatty and friied foods  Call for help with nutritional support in weight loss  Fasting lipid and TSH, Vit D and chem 7 1 week before next visit  Please work on good  health habits so that your health will improve. 1. Commitment to daily physical activity for 30 to 60  minutes, if you are able to do this.  2. Commitment to wise food choices. Aim for half of your  food intake to be vegetable and fruit, one quarter starchy foods, and one quarter protein. Try to eat on a regular schedule  3 meals per day, snacking between meals should be limited to vegetables or fruits or small portions of nuts. 64 ounces of water per day is generally recommended, unless you have specific health conditions, like heart failure or kidney failure where you will need to limit fluid intake.  3. Commitment to sufficient and a  good quality of physical and mental rest daily, generally between 6 to 8 hours per day.  WITH PERSISTANCE AND PERSEVERANCE, THE IMPOSSIBLE , BECOMES THE NORM! Thank you  for choosing [facility]. We consider it a privelige to serve you.  Delivering excellent health care in a caring and  compassionate way is our goal.  Partnering with you,  so that together we can achieve this goal is our strategy.

## 2017-01-13 ENCOUNTER — Ambulatory Visit: Payer: 59 | Admitting: Urology

## 2017-01-15 DIAGNOSIS — Z1211 Encounter for screening for malignant neoplasm of colon: Secondary | ICD-10-CM | POA: Insufficient documentation

## 2017-01-15 NOTE — Assessment & Plan Note (Signed)
Heme negative stool 

## 2017-01-15 NOTE — Assessment & Plan Note (Signed)

## 2017-01-15 NOTE — Progress Notes (Signed)
    Kristy Travis     MRN: [id number]      DOB: [birth year]  HPI: Patient is in for annual physical exam. No other health concerns are expressed or addressed at the visit. Recent labs, if available are reviewed. Immunization is reviewed , and  updated if needed.   PE: Pleasant  female, alert and oriented x 3, in no cardio-pulmonary distress. Afebrile. HEENT No facial trauma or asymetry. Sinuses non tender.  Extra occullar muscles intact, pupils equally reactive to light. External ears normal, tympanic membranes clear. Oropharynx moist, no exudate. Neck: supple, no adenopathy,JVD or thyromegaly.No bruits.  Chest: Clear to ascultation bilaterally.No crackles or wheezes. Non tender to palpation  Breast: No asymetry,no masses or lumps. No tenderness. No nipple discharge or inversion. No axillary or supraclavicular adenopathy  Cardiovascular system; Heart sounds normal,  S1 and  S2 ,no S3.  No murmur, or thrill. Apical beat not displaced Peripheral pulses normal.  Abdomen: Soft, non tender, no organomegaly or masses. No bruits. Bowel sounds normal. No guarding, tenderness or rebound.  Rectal:  Normal sphincter tone. No rectal mass. Guaiac negative stool.  GU: Not examined, asymptomatic, pap not due, hysterectomy  Musculoskeletal exam: Full ROM of spine, hips , shoulders and knees. No deformity ,swelling or crepitus noted. No muscle wasting or atrophy.   Neurologic: Cranial nerves 2 to 12 intact. Power, tone ,sensation and reflexes normal throughout. No disturbance in gait. No tremor.  Skin: Intact, no ulceration, erythema , scaling or rash noted. Pigmentation normal throughout  Psych; Normal mood and affect. Judgement and concentration normal   Assessment & Plan:  Annual physical exam Annual exam as documented. Counseling done  re healthy lifestyle involving commitment to 150 minutes exercise per week, heart healthy diet, and attaining healthy  weight.The importance of adequate sleep also discussed. Regular seat belt use and home safety, is also discussed. Changes in health habits are decided on by the patient with goals and time frames  set for achieving them. Immunization and cancer screening needs are specifically addressed at this visit.   Colon cancer screening Heme negative stool

## 2017-01-19 DIAGNOSIS — L987 Excessive and redundant skin and subcutaneous tissue: Secondary | ICD-10-CM | POA: Diagnosis not present

## 2017-01-19 DIAGNOSIS — R634 Abnormal weight loss: Secondary | ICD-10-CM | POA: Insufficient documentation

## 2017-01-19 DIAGNOSIS — Z9884 Bariatric surgery status: Secondary | ICD-10-CM | POA: Diagnosis not present

## 2017-03-21 ENCOUNTER — Emergency Department
Admission: EM | Admit: 2017-03-21 | Discharge: 2017-03-21 | Disposition: A | Discharge status: Home / Self Care | Payer: 59 | Attending: Emergency Medicine | Admitting: Emergency Medicine

## 2017-03-21 ENCOUNTER — Emergency Department: Payer: 59

## 2017-03-21 DIAGNOSIS — Z79899 Other long term (current) drug therapy: Secondary | ICD-10-CM | POA: Diagnosis not present

## 2017-03-21 DIAGNOSIS — R2241 Localized swelling, mass and lump, right lower limb: Secondary | ICD-10-CM | POA: Diagnosis not present

## 2017-03-21 DIAGNOSIS — M7121 Synovial cyst of popliteal space [Baker], right knee: Secondary | ICD-10-CM | POA: Diagnosis not present

## 2017-03-21 DIAGNOSIS — I10 Essential (primary) hypertension: Secondary | ICD-10-CM | POA: Insufficient documentation

## 2017-03-21 MED ORDER — HYDROCODONE-ACETAMINOPHEN 5-325 MG PO TABS: 1.0000 | ORAL_TABLET | Freq: Four times a day (QID) | ORAL | 0 refills | Status: DC | PRN

## 2017-03-21 NOTE — ED Notes (Signed)
See triage note  States she is having increased pain and swelling to right knee  States pain started about 1 week ago but became worse today  Pain is mainly posterior   Ambulates with limp d/t pain

## 2017-03-21 NOTE — Discharge Instructions (Signed)
Wear knee immobilizer for added support. Take Norco as needed for pain. Call and make an appointment with Dr. Ernest PineHooten who is on-call for orthopedics today.

## 2017-03-21 NOTE — ED Provider Notes (Signed)
[facility] Emergency Department Provider Note  ____________________________________________   First MD Initiated Contact with Patient 03/21/17 [phone]     (approximate)  I have reviewed the triage vital signs and the nursing notes.   HISTORY  Chief Complaint Leg Swelling  HPI Kristy Travis is a 51 y.o. female began having right knee and leg pain approximately one week ago but worse today while walking at work. Patient denies any injury. She states that leg is swollen and extremely tender to touch. Pain is increased with walking or standing. She is concerned about a DVT since the visit family history. She rates her pain as a 7 out of 10.  Past Medical History:  Diagnosis Date  . Arthritis   . Carpal tunnel syndrome   . Chronic fatigue   . GERD (gastroesophageal reflux disease)   . Hypertension   . Obesity   . Pancreatitis 07/2014   had  gall bladder removed , spent 1 week in hospital  . PONV (postoperative nausea and vomiting)     Patient Active Problem List   Diagnosis Date Noted  . Colon cancer screening 01/15/2017  . Chronic fatigue disorder 12/22/2016  . Kidney stone on right side 12/21/2016  . Abdominal pain, epigastric 03/20/2015  . Annual physical exam 12/30/2012  . Seasonal allergies 04/04/2012  . Vitamin D deficiency 08/27/2011  . PAIN IN JOINT, MULTIPLE SITES 09/02/2010  . Metabolic syndrome X 09/02/2010  . Hyperlipidemia 02/06/2010  . BACK PAIN, LUMBAR 01/15/2008  . Obesity 12/21/2007    Past Surgical History:  Procedure Laterality Date  . 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 CHOLECYSTECTOMY  07/2014  . cyst removed from left foot and bone removed from left foot  April and June 2010  . DILATION AND CURETTAGE OF UTERUS    . ESOPHAGOGASTRODUODENOSCOPY N/A 03/03/2016   Procedure: ESOPHAGOGASTRODUODENOSCOPY (EGD);  Surgeon: Malissa Hippo, MD;  Location: [facility];  Service: Endoscopy;  Laterality: N/A;  3:00 -  moved to 2:00 - Ann to notify pt  . laparoscopy to eval dyspepsia  2004  . TOTAL VAGINAL HYSTERECTOMY  06/2014   with BSO    Prior to Admission medications   Medication Sig Start Date End Date Taking? Authorizing Provider  amphetamine-dextroamphetamine (ADDERALL XR) 20 MG 24 hr capsule Take 1 capsule (20 mg total) by mouth daily. 12/21/16   Kerri Perches, MD  cholecalciferol (VITAMIN D) 1000 units tablet Take 1 tablet (1,000 Units total) by mouth daily. Patient not taking: Reported on 01/11/2017 06/28/16   Kerri Perches, MD  docusate sodium (COLACE) 100 MG capsule Take 100 mg by mouth 2 (two) times daily.    [provider]  ferrous sulfate 325 (65 FE) MG EC tablet Take 325 mg by mouth 3 (three) times daily with meals.    [provider]  HYDROcodone-acetaminophen (NORCO/VICODIN) 5-325 MG tablet Take 1 tablet by mouth every 6 (six) hours as needed for moderate pain. 03/21/17   Tommi Rumps, PA-C  pantoprazole (PROTONIX) 40 MG tablet Take 1 tablet (40 mg total) by mouth 2 (two) times daily before a meal. 12/21/16   Setzer, Brand Males, NP  ranitidine (ZANTAC) 150 MG tablet Take 150 mg by mouth 2 (two) times daily.    [provider]  Vitamin D, Ergocalciferol, (DRISDOL) 50000 units CAPS capsule Take 1 capsule (50,000 Units total) by mouth every 7 (seven) days. 09/02/16   Sherrie Mustache Roselyn Bering, PA-C    Allergies Maxzide [  triamterene-hctz]  Family History  Problem Relation Age of Onset  . Thrombosis Mother   . Diabetes Father   . Pulmonary Hypertension Father   . Breast cancer Neg Hx   . Bladder Cancer Neg Hx   . Kidney cancer Neg Hx     Social History Social History  Substance Use Topics  . Smoking status: Never Smoker  . Smokeless tobacco: Never Used  . Alcohol use Yes     Comment: occasionally    Review of Systems Constitutional: No fever/chills Cardiovascular: Denies chest pain. Respiratory: Denies shortness of breath. Musculoskeletal:  Positive right leg pain. Skin: Negative for rash. Neurological: Negative for headaches, focal weakness or numbness. ____________________________________________   PHYSICAL EXAM:  VITAL SIGNS: ED Triage Vitals  Enc Vitals Group     BP 03/21/17 1509 (!) 139/59     Pulse Rate 03/21/17 1509 80     Resp 03/21/17 1509 18     Temp 03/21/17 1509 98.6 F (37 C)     Temp Source 03/21/17 1509 Oral     SpO2 03/21/17 1509 97 %     Weight 03/21/17 1509 198 lb (89.8 kg)     Height 03/21/17 1509  (1.651 m)     Head Circumference --      Peak Flow --      Pain Score 03/21/17 1508 7     Pain Loc --      Pain Edu? --      Excl. in GC? --    Constitutional: Alert and oriented. Well appearing and in no acute distress. Eyes: Conjunctivae are normal.  Head: Atraumatic. Neck: No stridor.   Cardiovascular: Normal rate, regular rhythm. Grossly normal heart sounds.  Good peripheral circulation. Respiratory: Normal respiratory effort.  No retractions. Lungs CTAB. Musculoskeletal: Examination of the right knee shows no gross deformity. There is moderate tenderness on palpation of the medial aspect and posterior. No erythema or warmth is appreciated. Range of motion is decreased secondary to pain. Pulses present. Skin is intact. Motor sensory function intact. Patient is able to bear weight and walk with a limp. Neurologic:  Normal speech and language. No gross focal neurologic deficits are appreciated.  Skin:  Skin is warm, dry and intact. No rash noted. Psychiatric: Mood and affect are normal. Speech and behavior are normal. ___________________________________________   LABS (all labs ordered are listed, but only abnormal results are displayed)  Labs Reviewed - No data to display  RADIOLOGY  US Venous Img Lower Unilateral Right  Result Date: 03/21/2017 CLINICAL DATA:  Right lower extremity swelling and pain for 4 weeks EXAM: RIGHT LOWER EXTREMITY VENOUS DOPPLER ULTRASOUND TECHNIQUE:  Gray-scale sonography with graded compression, as well as color Doppler and duplex ultrasound were performed to evaluate the lower extremity deep venous systems from the level of the common femoral vein and including the common femoral, femoral, profunda femoral, popliteal and calf veins including the posterior tibial, peroneal and gastrocnemius veins when visible. The superficial great saphenous vein was also interrogated. Spectral Doppler was utilized to evaluate flow at rest and with distal augmentation maneuvers in the common femoral, femoral and popliteal veins. COMPARISON:  None. FINDINGS: Contralateral Common Femoral Vein: Respiratory phasicity is normal and symmetric with the symptomatic side. No evidence of thrombus. Normal compressibility. Common Femoral Vein: No evidence of thrombus. Saphenofemoral Junction: No evidence of thrombus. Profunda Femoral Vein: No evidence of thrombus. Femoral Vein: No evidence of thrombus. Popliteal Vein: No evidence of thrombus. Calf Veins: No evidence of thrombus.  Superficial Great Saphenous Vein: No evidence of thrombus. Other Findings: There is a avascular hypoechoic collection in the right popliteal fossa measuring 5.2 x 1.4 x 2 cm. IMPRESSION: 1. No evidence of DVT within the right lower extremity. 2. 5 x 2 cm right popliteal fossa cyst, usually Baker cyst. Electronically Signed   By: Marnee SpringJonathon  Watts M.D.   On: 03/21/2017 16:13    ____________________________________________   PROCEDURES  Procedure(s) performed: None  Procedures  Critical Care performed: No  ____________________________________________   INITIAL IMPRESSION / ASSESSMENT AND PLAN / ED COURSE  Pertinent labs & imaging results that were available during my care of the patient were reviewed by me and considered in my medical decision making (see chart for details).  Ultrasound of the right lower extremity was reassuring that this is not a DVT. Patient has a Baker's cyst. She is referred  to Dr. Ernest PineHooten for further evaluation. With patient having more pain on bending her knee she was placed in a knee immobilizer. She is unable to take anti-inflammatories secondary to her gastric sleeve. She is given a prescription for Norco one every 6 hours as needed for pain. She is given a note to remain out of work and when returning wear her knee immobilizer as needed.   ___________________________________________   FINAL CLINICAL IMPRESSION(S) / ED DIAGNOSES  Final diagnoses:  Baker's cyst of knee, right      NEW MEDICATIONS STARTED DURING THIS VISIT:  Discharge Medication List as of 03/21/2017  4:49 PM    START taking these medications   Details  HYDROcodone-acetaminophen (NORCO/VICODIN) 5-325 MG tablet Take 1 tablet by mouth every 6 (six) hours as needed for moderate pain., Starting Mon 03/21/2017, Print         Note:  This document was prepared using Dragon voice recognition software and may include unintentional dictation errors.    Tommi RumpsSummers, Rhonda L, PA-C 03/21/17 1805    Sharman CheekStafford, Phillip, MD 03/22/17 2139

## 2017-03-21 NOTE — ED Triage Notes (Addendum)
Right knee and leg pain X 1 week, worse with walking. Family hx of blood clots. Swelling and tenderness to touch.

## 2017-04-01 DIAGNOSIS — M25561 Pain in right knee: Secondary | ICD-10-CM | POA: Diagnosis not present

## 2017-04-01 DIAGNOSIS — M7121 Synovial cyst of popliteal space [Baker], right knee: Secondary | ICD-10-CM | POA: Diagnosis not present

## 2017-04-01 DIAGNOSIS — M1711 Unilateral primary osteoarthritis, right knee: Secondary | ICD-10-CM | POA: Diagnosis not present

## 2017-04-06 ENCOUNTER — Ambulatory Visit (INDEPENDENT_AMBULATORY_CARE_PROVIDER_SITE_OTHER): Payer: 59 | Admitting: Internal Medicine

## 2017-04-06 ENCOUNTER — Encounter (INDEPENDENT_AMBULATORY_CARE_PROVIDER_SITE_OTHER): Payer: Self-pay | Admitting: Internal Medicine

## 2017-04-06 ENCOUNTER — Encounter (INDEPENDENT_AMBULATORY_CARE_PROVIDER_SITE_OTHER): Payer: Self-pay | Admitting: *Deleted

## 2017-04-06 VITALS — BP 144/84 | HR 84 | Ht 65.0 in | Wt 195.9 lb

## 2017-04-06 DIAGNOSIS — K219 Gastro-esophageal reflux disease without esophagitis: Secondary | ICD-10-CM

## 2017-04-06 DIAGNOSIS — R509 Fever, unspecified: Secondary | ICD-10-CM

## 2017-04-06 DIAGNOSIS — R1013 Epigastric pain: Secondary | ICD-10-CM | POA: Diagnosis not present

## 2017-04-06 HISTORY — DX: Gastro-esophageal reflux disease without esophagitis: K21.9

## 2017-04-06 NOTE — Patient Instructions (Signed)
Labs and CT. Further recommendations to follow 

## 2017-04-06 NOTE — Progress Notes (Signed)
   Subjective:    Patient ID: Kristy Travis, female    DOB: [birth year], 51 y.o.   MRN: [id number]  HPI Presents today with c/o  epigastric pain. Has been hurting since Monday. She has been vomiting. Rates the pain 8/10.  Last seen in October of 2017. Hx of chronic GERD. Underwent CT renal stone study in June which revealed a small hiatal hernia.(kidney stone).  She tells me she has had a fever. Has had some diarrhea.  No appetite.  She says she ate a baked potato at lunch and her stomach was tied in a knot.afterwards. Has not had a fever today. No urinary symptoms.  Underwent an EGD in August of 2017 for epigastric pain and heart burn with hx of sleeve gastrectomy. Impression:               - Normal upper third of esophagus and middle third                            of esophagus.                           - LA Grade B reflux esophagitis. Erosions involving                            distal 2 cm of esophageal mucosa.                           - Z-line regular, 35 cm from the incisors.                           - A sleeve gastrectomy was found, characterized by                            healthy appearing mucosa.                           - Gastritis. Biopsied.                           - Scar in the gastric antrum.                           - Normal duodenal bulb and second portion of the                            duodenum.  Review of Systems     Objective:   Physical Exam Blood pressure (!) 144/84, pulse 84, height  (1.651 m), weight 195 lb 14.4 oz (88.9 kg), last menstrual period 06/24/2014. Alert and oriented. Skin warm and dry. Oral mucosa is moist.   . Sclera anicteric, conjunctivae is pink. Thyroid not enlarged. No cervical lymphadenopathy. Lungs clear. Heart regular rate and rhythm.  Abdomen is soft. Bowel sounds are positive. No hepatomegaly. No abdominal masses felt. Slight epigastric tenderness.  No rebound. No edema to lower extremities.           Assessment &  Plan:  Epigastric pain. Will get a CT abdomen with CM.  CBC and hepatic

## 2017-04-07 ENCOUNTER — Ambulatory Visit: Admission: RE | Admit: 2017-04-07 | Payer: 59 | Source: Ambulatory Visit

## 2017-04-07 LAB — CBC WITH DIFFERENTIAL/PLATELET
Basophils Absolute: 19 cells/uL (ref 0–200)
Basophils Relative: 0.3 %
Eosinophils Absolute: 112 cells/uL (ref 15–500)
Eosinophils Relative: 1.8 %
HCT: 41.3 % (ref 35.0–45.0)
Hemoglobin: 13.5 g/dL (ref 11.7–15.5)
Lymphs Abs: 663 cells/uL — ABNORMAL LOW (ref 850–3900)
MCH: 25.8 pg — ABNORMAL LOW (ref 27.0–33.0)
MCHC: 32.7 g/dL (ref 32.0–36.0)
MCV: 79 fL — ABNORMAL LOW (ref 80.0–100.0)
MPV: 10.2 fL (ref 7.5–12.5)
Monocytes Relative: 8.3 %
Neutro Abs: 4892 cells/uL (ref 1500–7800)
Neutrophils Relative %: 78.9 %
Platelets: 256 10*3/uL (ref 140–400)
RBC: 5.23 10*6/uL — ABNORMAL HIGH (ref 3.80–5.10)
RDW: 13.3 % (ref 11.0–15.0)
Total Lymphocyte: 10.7 %
WBC mixed population: 515 cells/uL (ref 200–950)
WBC: 6.2 10*3/uL (ref 3.8–10.8)

## 2017-04-07 LAB — URINALYSIS
Bilirubin Urine: NEGATIVE
Glucose, UA: NEGATIVE
Hgb urine dipstick: NEGATIVE
Nitrite: NEGATIVE
Protein, ur: NEGATIVE
Specific Gravity, Urine: 1.029 (ref 1.001–1.03)
pH: 5.5 (ref 5.0–8.0)

## 2017-04-07 LAB — HEPATIC FUNCTION PANEL
AG Ratio: 1.5 (calc) (ref 1.0–2.5)
ALT: 16 U/L (ref 6–29)
AST: 18 U/L (ref 10–35)
Albumin: 3.9 g/dL (ref 3.6–5.1)
Alkaline phosphatase (APISO): 69 U/L (ref 33–115)
Bilirubin, Direct: 0.1 mg/dL (ref 0.0–0.2)
Globulin: 2.6 g/dL (calc) (ref 1.9–3.7)
Indirect Bilirubin: 0.2 mg/dL (calc) (ref 0.2–1.2)
Total Bilirubin: 0.3 mg/dL (ref 0.2–1.2)
Total Protein: 6.5 g/dL (ref 6.1–8.1)

## 2017-04-12 ENCOUNTER — Ambulatory Visit
Admission: RE | Admit: 2017-04-12 | Discharge: 2017-04-12 | Disposition: A | Discharge status: Home / Self Care | Payer: 59 | Source: Ambulatory Visit | Attending: Internal Medicine | Admitting: Internal Medicine

## 2017-04-12 DIAGNOSIS — N200 Calculus of kidney: Secondary | ICD-10-CM | POA: Diagnosis not present

## 2017-04-12 DIAGNOSIS — R509 Fever, unspecified: Secondary | ICD-10-CM | POA: Insufficient documentation

## 2017-04-12 DIAGNOSIS — R1013 Epigastric pain: Secondary | ICD-10-CM | POA: Diagnosis not present

## 2017-04-12 MED ORDER — IOPAMIDOL (ISOVUE-300) INJECTION 61%
100.0000 mL | Freq: Once | INTRAVENOUS | Status: AC | PRN
  Administered 2017-04-12: 100 mL via INTRAVENOUS

## 2017-04-13 ENCOUNTER — Encounter (INDEPENDENT_AMBULATORY_CARE_PROVIDER_SITE_OTHER): Payer: Self-pay | Admitting: *Deleted

## 2017-04-14 ENCOUNTER — Encounter (INDEPENDENT_AMBULATORY_CARE_PROVIDER_SITE_OTHER): Payer: Self-pay | Admitting: *Deleted

## 2017-04-14 DIAGNOSIS — Z0289 Encounter for other administrative examinations: Secondary | ICD-10-CM

## 2017-05-16 ENCOUNTER — Ambulatory Visit: Payer: Self-pay | Admitting: Family Medicine

## 2017-05-26 ENCOUNTER — Ambulatory Visit (INDEPENDENT_AMBULATORY_CARE_PROVIDER_SITE_OTHER): Payer: 59 | Admitting: Urology

## 2017-05-26 ENCOUNTER — Encounter: Payer: Self-pay | Admitting: Urology

## 2017-05-26 VITALS — BP 148/88 | HR 86 | Ht 65.0 in | Wt 203.0 lb

## 2017-05-26 DIAGNOSIS — N200 Calculus of kidney: Secondary | ICD-10-CM | POA: Diagnosis not present

## 2017-05-26 DIAGNOSIS — M5441 Lumbago with sciatica, right side: Secondary | ICD-10-CM

## 2017-05-26 DIAGNOSIS — G8929 Other chronic pain: Secondary | ICD-10-CM | POA: Diagnosis not present

## 2017-05-26 LAB — URINALYSIS, COMPLETE
Bilirubin, UA: NEGATIVE
Glucose, UA: NEGATIVE
Ketones, UA: NEGATIVE
Nitrite, UA: NEGATIVE
Protein, UA: NEGATIVE
RBC, UA: NEGATIVE
Specific Gravity, UA: 1.025 (ref 1.005–1.030)
Urobilinogen, Ur: 1 mg/dL (ref 0.2–1.0)
pH, UA: 6 (ref 5.0–7.5)

## 2017-05-26 LAB — MICROSCOPIC EXAMINATION: RBC, UA: NONE SEEN /hpf (ref 0–?)

## 2017-05-26 NOTE — Progress Notes (Signed)
05/26/2017 12:24 PM   Kristy Travis 02/15/1966 [id number]  Referring provider: Kerri PerchesSimpson, Margaret E, MD [address], [address] [place], [place] [address]  Chief Complaint  Patient presents with  . Nephrolithiasis    follow up    HPI: 51 year old female presents for evaluation of kidney stone pain.  She has a history of a small right lower pole renal calculus dating back to 2009.  She saw Dr. Sherryl BartersBudzyn in June 2018 for evaluation of right flank pain.  CT showed a nonobstructing 2 mm right lower pole renal calculus which was stable.  She apparently had an episode of hematuria.  It was felt her pain was not due to the stone however cystoscopy was recommended.  She decided not to have this done.  She denies recurrent gross hematuria.  She has stable urinary frequency.  She states she has had right low back pain for greater than 1 year which has been intermittent.  The pain is nonradiating.  It is usually worse at night when she is laying on that side.  There are no other identifiable precipitating, aggravating or alleviating factors.  She does give a history of a bulging lumbar disc on MRI several years ago.  She denies nausea, vomiting, fevers, chills.   PMH: Past Medical History:  Diagnosis Date  . Arthritis   . Carpal tunnel syndrome   . Chronic fatigue   . GERD (gastroesophageal reflux disease)   . GERD (gastroesophageal reflux disease) 04/06/2017  . Hypertension   . Obesity   . Pancreatitis 07/2014   had  gall bladder removed , spent 1 week in hospital  . PONV (postoperative nausea and vomiting)     Surgical History: Past Surgical History:  Procedure Laterality Date  . 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 CHOLECYSTECTOMY  07/2014  . cyst removed from left foot and bone removed from left foot  April and June 2010  . DILATION AND CURETTAGE OF UTERUS    . ESOPHAGOGASTRODUODENOSCOPY N/A 03/03/2016   Procedure: ESOPHAGOGASTRODUODENOSCOPY (EGD);  Surgeon: Malissa HippoNajeeb U  Rehman, MD;  Location: [facility];  Service: Endoscopy;  Laterality: N/A;  3:00 - moved to 2:00 - Ann to notify pt  . laparoscopy to eval dyspepsia  2004  . TOTAL VAGINAL HYSTERECTOMY  06/2014   with BSO    Home Medications:  Allergies as of 05/26/2017      Reactions   Maxzide [triamterene-hctz] Nausea Only      Medication List        Accurate as of 05/26/17 12:24 PM. Always use your most recent med list.          ferrous sulfate 325 (65 FE) MG EC tablet Take 325 mg by mouth 3 (three) times daily with meals.   pantoprazole 40 MG tablet Commonly known as:  PROTONIX Take 1 tablet (40 mg total) by mouth 2 (two) times daily before a meal.   ranitidine 150 MG tablet Commonly known as:  ZANTAC Take 150 mg by mouth 2 (two) times daily.   Vitamin D (Ergocalciferol) 50000 units Caps capsule Commonly known as:  DRISDOL Take 1 capsule (50,000 Units total) by mouth every 7 (seven) days.       Allergies:  Allergies  Allergen Reactions  . Maxzide [Triamterene-Hctz] Nausea Only    Family History: Family History  Problem Relation Age of Onset  . Thrombosis Mother   . Diabetes Father   . Pulmonary Hypertension Father   . Breast cancer Neg  Hx   . Bladder Cancer Neg Hx   . Kidney cancer Neg Hx     Social History:  reports that  has never smoked. she has never used smokeless tobacco. She reports that she drinks alcohol. She reports that she does not use drugs.  ROS: UROLOGY Frequent Urination?: Yes Hard to postpone urination?: No Burning/pain with urination?: No Get up at night to urinate?: Yes Leakage of urine?: No Urine stream starts and stops?: No Trouble starting stream?: No Do you have to strain to urinate?: No Blood in urine?: No Urinary tract infection?: No Sexually transmitted disease?: No Injury to kidneys or bladder?: No Painful intercourse?: No Weak stream?: No Currently pregnant?: No Vaginal bleeding?: No Last menstrual period?:  n  Gastrointestinal Nausea?: No Vomiting?: No Indigestion/heartburn?: Yes Diarrhea?: No Constipation?: No  Constitutional Fever: No Night sweats?: No Weight loss?: No Fatigue?: No  Skin Skin rash/lesions?: No Itching?: No  Eyes Blurred vision?: No Double vision?: No  Ears/Nose/Throat Sore throat?: No Sinus problems?: No  Hematologic/Lymphatic Swollen glands?: No Easy bruising?: No  Cardiovascular Leg swelling?: No Chest pain?: No  Respiratory Cough?: No Shortness of breath?: No  Endocrine Excessive thirst?: No  Musculoskeletal Back pain?: Yes Joint pain?: No  Neurological Headaches?: No Dizziness?: No  Psychologic Depression?: No Anxiety?: No  Physical Exam: BP (!) 148/88   Pulse 86   Ht 5\' 5"  (1.651 m)   Wt 203 lb (92.1 kg)   LMP 06/24/2014   BMI 33.78 kg/m   Constitutional:  Alert and oriented, No acute distress. HEENT: [place] AT, moist mucus membranes.  Trachea midline, no masses. Cardiovascular: No clubbing, cyanosis, or edema. Respiratory: Normal respiratory effort, no increased work of breathing. GI: Abdomen is soft, nontender, nondistended, no abdominal masses GU: No CVA tenderness.  Skin: No rashes, bruises or suspicious lesions. Lymph: No cervical or inguinal adenopathy. Neurologic: Grossly intact, no focal deficits, moving all 4 extremities. Psychiatric: Normal mood and affect.  Laboratory Data: Lab Results  Component Value Date   WBC 6.2 04/06/2017   HGB 13.5 04/06/2017   HCT 41.3 04/06/2017   MCV 79.0 (L) 04/06/2017   PLT 256 04/06/2017    Lab Results  Component Value Date   CREATININE 0.62 12/24/2015    Lab Results  Component Value Date   HGBA1C 6.2 08/16/2013    Urinalysis Dipstick 1+ leukocytes/no significant abnormalities microscopy  Pertinent Imaging: CT was personally reviewed.   Results for orders placed during the hospital encounter of 12/16/16  CT Renal Stone Study   Narrative CLINICAL DATA:   Dysuria with gross hematuria and right suprapubic pain. Flank pain for 2 days. History of kidney stones.  EXAM: CT ABDOMEN AND PELVIS WITHOUT CONTRAST  TECHNIQUE: Multidetector CT imaging of the abdomen and pelvis was performed following the standard protocol without IV contrast.  COMPARISON:  None.  FINDINGS: Lower chest:  Small hiatal hernia.  Hepatobiliary: 6 mm hypoattenuating lesion medial segment left liver is too small to characterize but most likely is a tiny cyst. Liver parenchyma otherwise unremarkable noncontrast appearance. Gallbladder surgically absent. No intrahepatic or extrahepatic biliary dilation.  Pancreas: No focal mass lesion. No dilatation of the main duct. No intraparenchymal cyst. No peripancreatic edema.  Spleen: No splenomegaly. No focal mass lesion.  Adrenals/Urinary Tract: No adrenal nodule or mass. 3 mm nonobstructing stone identified lower pole right kidney. No left renal stones. No hydronephrosis or secondary change in either kidney. No evidence for hydroureter. The urinary bladder appears normal for the degree of distention.  Stomach/Bowel: Suture line along the greater curvature of the stomach is compatible with the reported history of previous sleeve gastrectomy. Duodenum is normally positioned as is the ligament of Treitz. No small bowel wall thickening. No small bowel dilatation. The terminal ileum is normal. The appendix is normal. No gross colonic mass. No colonic wall thickening. No substantial diverticular change.  Vascular/Lymphatic: No abdominal aortic aneurysm. No abdominal aortic atherosclerotic calcification. There is no gastrohepatic or hepatoduodenal ligament lymphadenopathy. No intraperitoneal or retroperitoneal lymphadenopathy. No pelvic sidewall lymphadenopathy.  Reproductive: Uterus surgically absent.  There is no adnexal mass.  Other: No intraperitoneal free fluid.  Musculoskeletal: Bone windows reveal no worrisome  lytic or sclerotic osseous lesions.  IMPRESSION: 1. 3 mm nonobstructing stone identified lower pole right kidney. No secondary changes in either kidney or ureter. No ureteral or bladder stones. Otherwise no acute findings in the abdomen or pelvis. 2. Status post sleeve gastrectomy with small hiatal hernia.   Electronically Signed   By: Kennith CenterEric  Mansell M.D.   On: 12/16/2016 13:36     Assessment & Plan:    51 year old female with a 2 mm nonobstructing right lower pole calculus.  No ureteral calculi or hydronephrosis seen on CT.  She was informed that the stone would not be a source of her pain.  She has a history of bulging disc and her pain is primarily in the right low back region.  Have recommended an orthopedic evaluation.  She elected not to have a cystoscopy before prior episode of hematuria and states she does understand the risks of undiagnosed bladder malignancy.  1. Chronic right-sided low back pain with right-sided sciatica  - Ambulatory referral to Orthopedic Surgery  2. Nephrolithiasis  - Urinalysis, Complete   Riki AltesScott C Merilee Wible, MD  [facility] [address], [address] [place], [place] [address] [phone]

## 2017-08-03 ENCOUNTER — Ambulatory Visit
Admission: RE | Admit: 2017-08-03 | Discharge: 2017-08-03 | Disposition: A | Discharge status: Home / Self Care | Payer: 59 | Source: Ambulatory Visit | Attending: Family Medicine | Admitting: Family Medicine

## 2017-08-03 ENCOUNTER — Other Ambulatory Visit: Payer: Self-pay | Admitting: Family Medicine

## 2017-08-03 DIAGNOSIS — Z1231 Encounter for screening mammogram for malignant neoplasm of breast: Secondary | ICD-10-CM | POA: Insufficient documentation

## 2017-08-29 ENCOUNTER — Ambulatory Visit (INDEPENDENT_AMBULATORY_CARE_PROVIDER_SITE_OTHER): Payer: Self-pay | Admitting: Nurse Practitioner

## 2017-08-29 VITALS — BP 119/62 | HR 98 | Temp 98.3°F | Wt 199.0 lb

## 2017-08-29 DIAGNOSIS — J0100 Acute maxillary sinusitis, unspecified: Secondary | ICD-10-CM

## 2017-08-29 MED ORDER — FLUTICASONE PROPIONATE 50 MCG/ACT NA SUSP: 2.0000 | Freq: Every day | NASAL | 0 refills | Status: DC

## 2017-08-29 MED ORDER — FLUCONAZOLE 150 MG PO TABS: 150.0000 mg | ORAL_TABLET | Freq: Once | ORAL | 0 refills | Status: AC

## 2017-08-29 MED ORDER — AMOXICILLIN-POT CLAVULANATE 875-125 MG PO TABS: 1.0000 | ORAL_TABLET | Freq: Two times a day (BID) | ORAL | 0 refills | Status: AC

## 2017-08-29 MED ORDER — PSEUDOEPH-BROMPHEN-DM 30-2-10 MG/5ML PO SYRP: 5.0000 mL | ORAL_SOLUTION | Freq: Four times a day (QID) | ORAL | 0 refills | Status: AC | PRN

## 2017-08-29 NOTE — Progress Notes (Addendum)
Subjective:  Kristy Travis is a 52 y.o. female who presents for evaluation of possible sinusitis.  Symptoms include congestion, chest pain during cough, facial pain, fever: suspected fevers but not measured at home, post nasal drip, productive cough with brown colored sputum, sinus pressure, sinus pain, sore throat and headache.  Onset of symptoms was 4 days ago, and has been gradually worsening since that time.  Treatment to date:  Theraflu and Delsym with minimal improvement.  High risk factors for influenza complications:  none.  The following portions of the patient's history were reviewed and updated as appropriate:  allergies, current medications and past medical history.  Constitutional: positive for chills, fatigue and fevers, negative for anorexia, malaise, night sweats and sweats Eyes: negative Ears, nose, mouth, throat, and face: positive for nasal congestion, sore throat and bilateral ear pressure/fullness, negative for ear drainage, earaches and hoarseness Respiratory: positive for cough and wheezing, negative for asthma, chronic bronchitis, dyspnea on exertion, pneumonia and stridor Cardiovascular: negative Gastrointestinal: negative Neurological: positive for headaches, negative for coordination problems, paresthesia, vertigo and weakness Allergic/Immunologic: negative for hay fever Objective:  BP 119/62   Pulse 98   Temp 98.3 F (36.8 C)   Wt 199 lb (90.3 kg)   LMP 06/24/2014   SpO2 96%   BMI 33.12 kg/m  General appearance: alert, cooperative, fatigued and no distress Head: Normocephalic, without obvious abnormality, atraumatic Eyes: conjunctivae/corneas clear. PERRL, EOM's intact. Fundi benign. Ears: abnormal TM right ear - mucoid middle ear fluid and abnormal TM left ear - mucoid middle ear fluid Nose: no discharge, turbinates swollen, inflamed, moderate maxillary sinus tenderness bilateral Throat: abnormal findings: mild oropharyngeal erythema and no  exudates Lungs: clear to auscultation bilaterally Heart: regular rate and rhythm, S1, S2 normal, no murmur, click, rub or gallop Abdomen: soft, non-tender; bowel sounds normal; no masses,  no organomegaly Pulses: 2+ and symmetric Skin: Skin color, texture, turgor normal. No rashes or lesions Lymph nodes: cervical lymphadenopathy, left Neurologic: Grossly normal    Assessment:  sinusitis    Plan:  Discussed diagnosis and treatment of sinusitis. Discussed the importance of avoiding unnecessary antibiotic therapy. Suggested symptomatic OTC remedies. Supportive care with appropriate antipyretics and fluids. Augmentin per orders. Nasal steroids per orders. Diflucan given for yeast infection during antibiotic regimen.

## 2017-08-29 NOTE — Patient Instructions (Addendum)

## 2017-09-01 ENCOUNTER — Telehealth: Payer: Self-pay | Admitting: Emergency Medicine

## 2017-09-01 DIAGNOSIS — L821 Other seborrheic keratosis: Secondary | ICD-10-CM | POA: Diagnosis not present

## 2017-09-01 NOTE — Telephone Encounter (Signed)
Contacted patient following up on her visit . Per patient doing a little better still on medication prescribed but still have a cough and almost out of cough medication was given a 7d supply instructed patient to contact office if not any better   by Monday.Patient acknowledge understanding

## 2017-09-08 DIAGNOSIS — H524 Presbyopia: Secondary | ICD-10-CM | POA: Diagnosis not present

## 2017-09-20 ENCOUNTER — Encounter: Payer: Self-pay | Admitting: Family Medicine

## 2017-09-20 ENCOUNTER — Ambulatory Visit (INDEPENDENT_AMBULATORY_CARE_PROVIDER_SITE_OTHER): Payer: Self-pay | Admitting: Family Medicine

## 2017-09-20 VITALS — BP 118/78 | HR 100 | Temp 98.7°F | Wt 203.0 lb

## 2017-09-20 DIAGNOSIS — R300 Dysuria: Secondary | ICD-10-CM

## 2017-09-20 DIAGNOSIS — R112 Nausea with vomiting, unspecified: Secondary | ICD-10-CM

## 2017-09-20 DIAGNOSIS — R197 Diarrhea, unspecified: Secondary | ICD-10-CM

## 2017-09-20 LAB — POCT URINALYSIS DIPSTICK
Blood, UA: NEGATIVE
Glucose, UA: NEGATIVE
Ketones, UA: NEGATIVE
Leukocytes, UA: NEGATIVE
Nitrite, UA: NEGATIVE
Spec Grav, UA: >=1.03 — AB (ref 1.010–1.025)
Urobilinogen, UA: 1 E.U./dL
pH, UA: 5.5 (ref 5.0–8.0)

## 2017-09-20 NOTE — Progress Notes (Signed)
Kristy Travis is a 51 y.o. female who presents with a myriad of complaints that include a recent history of over the past 72 hours but no current symptoms of nausea, vomiting, diarrhea, bleeding from vaginal area when wiping after urination, one incident of burning on urination and  Intermittent back pain. These symptoms appear to present themselves according ot patient report individually. Patient reports that Saturday she experienced a few episodes each of vomiting and diarrhea. Patient has a relavent history that was discovered on provider interview of gastric sleeve x 4 years ago- denies complications outside of GERD that is not responding to current treatment of pantoprazole 40 mg, current kidney stone (patient reports middle kidney stone and that she is under the care of a nephrologist), patient also reports that she experiences frequent urinary tract infections.  Review of Systems  Constitutional: Negative for chills, diaphoresis, fever, malaise/fatigue and weight loss.  HENT: Negative.   Eyes: Negative.   Respiratory: Negative.   Cardiovascular: Negative.   Gastrointestinal: Positive for diarrhea, nausea and vomiting.  Genitourinary: Positive for dysuria and hematuria.  Musculoskeletal: Negative.   Skin: Negative.   Neurological: Negative.   Endo/Heme/Allergies: Negative.   Psychiatric/Behavioral: Negative.     O: Vitals:   09/20/17 1435  BP: 118/78  Pulse: 100  Temp: 98.7 F (37.1 C)  SpO2: 96%   Physical Exam  Constitutional: She is oriented to person, place, and time. She appears well-developed and well-nourished. She is active.  HENT:  Head: Normocephalic.  Cardiovascular: Normal rate and regular rhythm.  Pulmonary/Chest: Effort normal and breath sounds normal.  Abdominal: Soft. Bowel sounds are normal. There is no tenderness.  Denies flank pain on exam  Neurological: She is alert and oriented to person, place, and time.  Skin: Skin is warm.  Vitals  reviewed.   A: 1. Dysuria   2. Diarrhea, unspecified type   3. Non-intractable vomiting with nausea, unspecified vomiting type    P: 1. Dysuria - POCT Urinalysis Dipstick- NEGATIVE  Results reviewed and discussed with patient who was advised to f/u with PCP or nephrologist if culture is desired and since patient has known history of current kidney stone that she is under treatment for and POCT was very normal and symptoms are mild intermittent, resolved, without systemic issue like fever, chills or flank pain.  2. Diarrhea, unspecified type RESOLVED  3. Non-intractable vomiting with nausea, unspecified vomiting type RESOLVED  PLAN: Discussed balanced diet and increasing hydration. Staff will conduct 48 hour f/u and patient can f/u in the next 7 days as a follow up or for repeat testing if symptoms continue may consider treatment. Patient verbalized understanding and agrees with POC at this time.   

## 2017-09-20 NOTE — Patient Instructions (Signed)

## 2017-09-20 NOTE — Progress Notes (Signed)
C/o low back pain w/bloody urine freq, urgency x 1d  Had diarrhea and vomiting on Sunday Otc: none

## 2017-09-22 ENCOUNTER — Telehealth: Payer: Self-pay | Admitting: Emergency Medicine

## 2017-09-22 NOTE — Telephone Encounter (Addendum)
Spoke with patient stated that she is doing better and appreciate us contacting her.

## 2017-10-03 DIAGNOSIS — M1711 Unilateral primary osteoarthritis, right knee: Secondary | ICD-10-CM | POA: Diagnosis not present

## 2017-10-03 DIAGNOSIS — M25561 Pain in right knee: Secondary | ICD-10-CM | POA: Diagnosis not present

## 2017-10-04 ENCOUNTER — Other Ambulatory Visit: Payer: Self-pay | Admitting: Physician Assistant

## 2017-10-04 DIAGNOSIS — M1711 Unilateral primary osteoarthritis, right knee: Secondary | ICD-10-CM

## 2017-10-10 ENCOUNTER — Other Ambulatory Visit: Payer: Self-pay | Admitting: Physician Assistant

## 2017-10-10 DIAGNOSIS — M1711 Unilateral primary osteoarthritis, right knee: Secondary | ICD-10-CM

## 2017-10-13 ENCOUNTER — Ambulatory Visit
Admission: RE | Admit: 2017-10-13 | Discharge: 2017-10-13 | Disposition: A | Discharge status: Home / Self Care | Payer: 59 | Source: Ambulatory Visit | Attending: Physician Assistant | Admitting: Physician Assistant

## 2017-10-13 DIAGNOSIS — S83271A Complex tear of lateral meniscus, current injury, right knee, initial encounter: Secondary | ICD-10-CM | POA: Insufficient documentation

## 2017-10-13 DIAGNOSIS — M1711 Unilateral primary osteoarthritis, right knee: Secondary | ICD-10-CM | POA: Diagnosis not present

## 2017-10-13 DIAGNOSIS — X58XXXA Exposure to other specified factors, initial encounter: Secondary | ICD-10-CM | POA: Insufficient documentation

## 2017-10-13 DIAGNOSIS — M7121 Synovial cyst of popliteal space [Baker], right knee: Secondary | ICD-10-CM | POA: Insufficient documentation

## 2017-10-13 DIAGNOSIS — M25561 Pain in right knee: Secondary | ICD-10-CM | POA: Diagnosis not present

## 2017-10-18 ENCOUNTER — Telehealth: Payer: Self-pay | Admitting: Family Medicine

## 2017-10-18 NOTE — Telephone Encounter (Signed)
Will come in for nurse visit tomorrow

## 2017-10-18 NOTE — Telephone Encounter (Signed)
Needs to know if she needs to come in for a Urin Culture.  Please call her

## 2017-10-20 ENCOUNTER — Ambulatory Visit (INDEPENDENT_AMBULATORY_CARE_PROVIDER_SITE_OTHER): Payer: 59

## 2017-10-20 DIAGNOSIS — R309 Painful micturition, unspecified: Secondary | ICD-10-CM | POA: Diagnosis not present

## 2017-10-20 LAB — POCT URINALYSIS DIPSTICK
Bilirubin, UA: NEGATIVE
Blood, UA: NEGATIVE
Glucose, UA: NEGATIVE
Ketones, UA: NEGATIVE
Nitrite, UA: NEGATIVE
Protein, UA: NEGATIVE
Spec Grav, UA: 1.015 (ref 1.010–1.025)
Urobilinogen, UA: 0.2 E.U./dL
pH, UA: 7 (ref 5.0–8.0)

## 2017-10-25 NOTE — Progress Notes (Signed)
Urinalysis showed to Dr.Nelson. Per Dr. Delton SeeNelson, no protein in urine dipstick, if anymore symptoms, needs to follow up with Dr. Lodema HongSimpson

## 2017-11-01 DIAGNOSIS — M2391 Unspecified internal derangement of right knee: Secondary | ICD-10-CM | POA: Diagnosis not present

## 2017-11-06 DIAGNOSIS — K8020 Calculus of gallbladder without cholecystitis without obstruction: Secondary | ICD-10-CM | POA: Insufficient documentation

## 2017-12-01 ENCOUNTER — Other Ambulatory Visit: Payer: Self-pay

## 2017-12-01 ENCOUNTER — Encounter
Admission: RE | Admit: 2017-12-01 | Discharge: 2017-12-01 | Disposition: A | Discharge status: Home / Self Care | Payer: 59 | Source: Ambulatory Visit | Attending: Orthopedic Surgery | Admitting: Orthopedic Surgery

## 2017-12-01 DIAGNOSIS — G4733 Obstructive sleep apnea (adult) (pediatric): Secondary | ICD-10-CM | POA: Insufficient documentation

## 2017-12-01 DIAGNOSIS — Z01818 Encounter for other preprocedural examination: Secondary | ICD-10-CM | POA: Diagnosis not present

## 2017-12-01 DIAGNOSIS — Z01812 Encounter for preprocedural laboratory examination: Secondary | ICD-10-CM | POA: Insufficient documentation

## 2017-12-01 DIAGNOSIS — I10 Essential (primary) hypertension: Secondary | ICD-10-CM | POA: Insufficient documentation

## 2017-12-01 DIAGNOSIS — I498 Other specified cardiac arrhythmias: Secondary | ICD-10-CM | POA: Insufficient documentation

## 2017-12-01 LAB — CBC
HCT: 36.7 % (ref 35.0–47.0)
Hemoglobin: 12 g/dL (ref 12.0–16.0)
MCH: 26.8 pg (ref 26.0–34.0)
MCHC: 32.7 g/dL (ref 32.0–36.0)
MCV: 81.8 fL (ref 80.0–100.0)
Platelets: 242 10*3/uL (ref 150–440)
RBC: 4.49 MIL/uL (ref 3.80–5.20)
RDW: 15.1 % — ABNORMAL HIGH (ref 11.5–14.5)
WBC: 5.7 10*3/uL (ref 3.6–11.0)

## 2017-12-01 NOTE — Patient Instructions (Signed)
Your procedure is scheduled on: Monday 12/12/17 Report to [place]. To find out your arrival time please call [phone] between 1PM - 3PM on Friday 12/09/17.  Remember: Instructions that are not followed completely may result in serious medical risk, up to and including death, or upon the discretion of your surgeon and anesthesiologist your surgery may need to be rescheduled.     _X__ 1. Do not eat food after midnight the night before your procedure.                 No gum chewing or hard candies. You may drink clear liquids up to 2 hours                 before you are scheduled to arrive for your surgery- DO not drink clear                 liquids within 2 hours of the start of your surgery.                 Clear Liquids include:  water, apple juice without pulp, clear carbohydrate                 drink such as Clearfast or Gatorade, Black Coffee or Tea (Do not add                 anything to coffee or tea).  __X__2.  On the morning of surgery brush your teeth with toothpaste and water, you                 may rinse your mouth with mouthwash if you wish.  Do not swallow any              toothpaste of mouthwash.     _X__ 3.  No Alcohol for 24 hours before or after surgery.   _X__ 4.  Do Not Smoke or use e-cigarettes For 24 Hours Prior to Your Surgery.                 Do not use any chewable tobacco products for at least 6 hours prior to                 surgery.  ____  5.  Bring all medications with you on the day of surgery if instructed.   __X__  6.  Notify your doctor if there is any change in your medical condition      (cold, fever, infections).     Do not wear jewelry, make-up, hairpins, clips or nail polish. Do not wear lotions, powders, or perfumes.  Do not shave 48 hours prior to surgery. Men may shave face and neck. Do not bring valuables to the hospital.    [facility] is not responsible for any belongings or  valuables.  Contacts, dentures/partials or body piercings may not be worn into surgery. Bring a case for your contacts, glasses or hearing aids, a denture cup will be supplied. Leave your suitcase in the car. After surgery it may be brought to your room. For patients admitted to the hospital, discharge time is determined by your treatment team.   Patients discharged the day of surgery will not be allowed to drive home.   Please read over the following fact sheets that you were given:   MRSA Information  __X__ Take these medicines the morning of surgery with A SIP OF WATER:  1. Pantoprazole at bedtime and again am of surgery  2.   3.   4.  5.  6.  ____ Fleet Enema (as directed)   __X__ Use CHG Soap/SAGE wipes as directed  ____ Use inhalers on the day of surgery  ____ Stop metformin/Janumet/Farxiga 2 days prior to surgery    ____ Take 1/2 of usual insulin dose the night before surgery. No insulin the morning          of surgery.   ____ Stop Blood Thinners Coumadin/Plavix/Xarelto/Pleta/Pradaxa/Eliquis/Effient/Aspirin  on   Or contact your Surgeon, Cardiologist or Medical Doctor regarding  ability to stop your blood thinners  __X__ Stop Anti-inflammatories 7 days before surgery such as Advil, Ibuprofen, Motrin,  BC or Goodies Powder, Naprosyn, Naproxen, Aleve, Aspirin    __X__ Stop all herbal supplements, fish oil or vitamin E until after surgery.    ____ Bring C-Pap to the hospital.

## 2017-12-12 ENCOUNTER — Encounter: Payer: Self-pay | Admitting: Orthopedic Surgery

## 2017-12-12 ENCOUNTER — Ambulatory Visit: Payer: 59 | Admitting: Anesthesiology

## 2017-12-12 ENCOUNTER — Other Ambulatory Visit: Payer: Self-pay

## 2017-12-12 ENCOUNTER — Ambulatory Visit
Admission: RE | Admit: 2017-12-12 | Discharge: 2017-12-12 | Disposition: A | Discharge status: Home / Self Care | Payer: 59 | Source: Ambulatory Visit | Attending: Orthopedic Surgery | Admitting: Orthopedic Surgery

## 2017-12-12 ENCOUNTER — Encounter
Admission: RE | Disposition: A | Discharge status: Home / Self Care | Payer: Self-pay | Source: Ambulatory Visit | Attending: Orthopedic Surgery

## 2017-12-12 DIAGNOSIS — R7302 Impaired glucose tolerance (oral): Secondary | ICD-10-CM | POA: Insufficient documentation

## 2017-12-12 DIAGNOSIS — M2341 Loose body in knee, right knee: Secondary | ICD-10-CM | POA: Diagnosis not present

## 2017-12-12 DIAGNOSIS — M222X9 Patellofemoral disorders, unspecified knee: Secondary | ICD-10-CM | POA: Insufficient documentation

## 2017-12-12 DIAGNOSIS — Z79899 Other long term (current) drug therapy: Secondary | ICD-10-CM | POA: Diagnosis not present

## 2017-12-12 DIAGNOSIS — E785 Hyperlipidemia, unspecified: Secondary | ICD-10-CM | POA: Diagnosis not present

## 2017-12-12 DIAGNOSIS — G4733 Obstructive sleep apnea (adult) (pediatric): Secondary | ICD-10-CM | POA: Insufficient documentation

## 2017-12-12 DIAGNOSIS — Z9884 Bariatric surgery status: Secondary | ICD-10-CM | POA: Insufficient documentation

## 2017-12-12 DIAGNOSIS — M23200 Derangement of unspecified lateral meniscus due to old tear or injury, right knee: Secondary | ICD-10-CM | POA: Diagnosis present

## 2017-12-12 DIAGNOSIS — E669 Obesity, unspecified: Secondary | ICD-10-CM | POA: Diagnosis not present

## 2017-12-12 DIAGNOSIS — Z6834 Body mass index (BMI) 34.0-34.9, adult: Secondary | ICD-10-CM | POA: Insufficient documentation

## 2017-12-12 DIAGNOSIS — Z888 Allergy status to other drugs, medicaments and biological substances status: Secondary | ICD-10-CM | POA: Insufficient documentation

## 2017-12-12 DIAGNOSIS — R12 Heartburn: Secondary | ICD-10-CM | POA: Insufficient documentation

## 2017-12-12 DIAGNOSIS — M1712 Unilateral primary osteoarthritis, left knee: Secondary | ICD-10-CM | POA: Insufficient documentation

## 2017-12-12 DIAGNOSIS — M23241 Derangement of anterior horn of lateral meniscus due to old tear or injury, right knee: Secondary | ICD-10-CM | POA: Diagnosis not present

## 2017-12-12 DIAGNOSIS — M94261 Chondromalacia, right knee: Secondary | ICD-10-CM | POA: Insufficient documentation

## 2017-12-12 DIAGNOSIS — M2391 Unspecified internal derangement of right knee: Secondary | ICD-10-CM | POA: Diagnosis not present

## 2017-12-12 DIAGNOSIS — Z9889 Other specified postprocedural states: Secondary | ICD-10-CM

## 2017-12-12 DIAGNOSIS — M1710 Unilateral primary osteoarthritis, unspecified knee: Secondary | ICD-10-CM | POA: Insufficient documentation

## 2017-12-12 DIAGNOSIS — M2241 Chondromalacia patellae, right knee: Secondary | ICD-10-CM | POA: Diagnosis not present

## 2017-12-12 HISTORY — DX: Impaired glucose tolerance (oral): R73.02

## 2017-12-12 HISTORY — PX: KNEE ARTHROSCOPY: SHX127

## 2017-12-12 SURGERY — ARTHROSCOPY, KNEE
Anesthesia: General | Site: Knee | Laterality: Right | Wound class: Clean

## 2017-12-12 MED ORDER — KETOROLAC TROMETHAMINE 30 MG/ML IJ SOLN
INTRAMUSCULAR | Status: AC
  Filled 2017-12-12: qty 1

## 2017-12-12 MED ORDER — ROCURONIUM BROMIDE 100 MG/10ML IV SOLN
INTRAVENOUS | Status: DC | PRN
  Administered 2017-12-12: 50 mg via INTRAVENOUS

## 2017-12-12 MED ORDER — ONDANSETRON HCL 4 MG/2ML IJ SOLN
INTRAMUSCULAR | Status: AC
  Filled 2017-12-12: qty 2

## 2017-12-12 MED ORDER — PROPOFOL 10 MG/ML IV BOLUS
INTRAVENOUS | Status: AC
Start: 2017-12-12 — End: ?
  Filled 2017-12-12: qty 20

## 2017-12-12 MED ORDER — METOPROLOL TARTRATE 5 MG/5ML IV SOLN
INTRAVENOUS | Status: DC | PRN
  Administered 2017-12-12: 2.5 mg via INTRAVENOUS

## 2017-12-12 MED ORDER — SUGAMMADEX SODIUM 200 MG/2ML IV SOLN
INTRAVENOUS | Status: AC
  Filled 2017-12-12: qty 2

## 2017-12-12 MED ORDER — FENTANYL CITRATE (PF) 100 MCG/2ML IJ SOLN
INTRAMUSCULAR | Status: AC
  Filled 2017-12-12: qty 2

## 2017-12-12 MED ORDER — FENTANYL CITRATE (PF) 100 MCG/2ML IJ SOLN
25.0000 ug | INTRAMUSCULAR | Status: AC | PRN
  Administered 2017-12-12 (×6): 25 ug via INTRAVENOUS

## 2017-12-12 MED ORDER — CHLORHEXIDINE GLUCONATE 4 % EX LIQD
60.0000 mL | Freq: Once | CUTANEOUS | Status: AC
  Administered 2017-12-12: 4 via TOPICAL

## 2017-12-12 MED ORDER — GLYCOPYRROLATE 0.2 MG/ML IJ SOLN
INTRAMUSCULAR | Status: AC
  Filled 2017-12-12: qty 1

## 2017-12-12 MED ORDER — BUPIVACAINE-EPINEPHRINE (PF) 0.25% -1:200000 IJ SOLN
INTRAMUSCULAR | Status: DC | PRN
  Administered 2017-12-12: 25 mL
  Administered 2017-12-12: 5 mL

## 2017-12-12 MED ORDER — HYDROCODONE-ACETAMINOPHEN 5-325 MG PO TABS: 1.0000 | ORAL_TABLET | ORAL | 0 refills | Status: DC | PRN

## 2017-12-12 MED ORDER — ONDANSETRON HCL 4 MG/2ML IJ SOLN
INTRAMUSCULAR | Status: DC | PRN
  Administered 2017-12-12: 4 mg via INTRAVENOUS

## 2017-12-12 MED ORDER — LIDOCAINE HCL (CARDIAC) PF 100 MG/5ML IV SOSY
PREFILLED_SYRINGE | INTRAVENOUS | Status: DC | PRN
  Administered 2017-12-12: 100 mg via INTRAVENOUS

## 2017-12-12 MED ORDER — FENTANYL CITRATE (PF) 100 MCG/2ML IJ SOLN
INTRAMUSCULAR | Status: DC | PRN
  Administered 2017-12-12: 50 ug via INTRAVENOUS
  Administered 2017-12-12 (×2): 25 ug via INTRAVENOUS
  Administered 2017-12-12: 50 ug via INTRAVENOUS

## 2017-12-12 MED ORDER — ONDANSETRON HCL 4 MG/2ML IJ SOLN
4.0000 mg | Freq: Once | INTRAMUSCULAR | Status: AC | PRN
  Administered 2017-12-12: 4 mg via INTRAVENOUS

## 2017-12-12 MED ORDER — ACETAMINOPHEN 10 MG/ML IV SOLN
INTRAVENOUS | Status: AC
  Filled 2017-12-12: qty 100

## 2017-12-12 MED ORDER — BUPIVACAINE-EPINEPHRINE (PF) 0.25% -1:200000 IJ SOLN
INTRAMUSCULAR | Status: AC
  Filled 2017-12-12: qty 30

## 2017-12-12 MED ORDER — HYDROCODONE-ACETAMINOPHEN 5-325 MG PO TABS
ORAL_TABLET | ORAL | Status: AC
  Filled 2017-12-12: qty 2

## 2017-12-12 MED ORDER — ACETAMINOPHEN 10 MG/ML IV SOLN
INTRAVENOUS | Status: DC | PRN
  Administered 2017-12-12: 1000 mg via INTRAVENOUS

## 2017-12-12 MED ORDER — METOPROLOL TARTRATE 5 MG/5ML IV SOLN
INTRAVENOUS | Status: AC
  Filled 2017-12-12: qty 5

## 2017-12-12 MED ORDER — LACTATED RINGERS IV SOLN
INTRAVENOUS | Status: DC
  Administered 2017-12-12 (×2): via INTRAVENOUS

## 2017-12-12 MED ORDER — HYDROCODONE-ACETAMINOPHEN 5-325 MG PO TABS
1.0000 | ORAL_TABLET | Freq: Four times a day (QID) | ORAL | Status: DC | PRN
  Administered 2017-12-12: 2 via ORAL

## 2017-12-12 MED ORDER — KETOROLAC TROMETHAMINE 30 MG/ML IJ SOLN
30.0000 mg | Freq: Once | INTRAMUSCULAR | Status: AC
  Administered 2017-12-12: 30 mg via INTRAVENOUS

## 2017-12-12 MED ORDER — MORPHINE SULFATE 4 MG/ML IJ SOLN
INTRAMUSCULAR | Status: DC | PRN
  Administered 2017-12-12: 4 mg

## 2017-12-12 MED ORDER — SUGAMMADEX SODIUM 200 MG/2ML IV SOLN
INTRAVENOUS | Status: DC | PRN
  Administered 2017-12-12: 190 mg via INTRAVENOUS

## 2017-12-12 MED ORDER — PROPOFOL 10 MG/ML IV BOLUS
INTRAVENOUS | Status: DC | PRN
  Administered 2017-12-12: 30 mg via INTRAVENOUS
  Administered 2017-12-12: 180 mg via INTRAVENOUS
  Administered 2017-12-12: 20 mg via INTRAVENOUS

## 2017-12-12 MED ORDER — MIDAZOLAM HCL 2 MG/2ML IJ SOLN
INTRAMUSCULAR | Status: DC | PRN
  Administered 2017-12-12: 1 mg via INTRAVENOUS

## 2017-12-12 MED ORDER — MORPHINE SULFATE (PF) 4 MG/ML IV SOLN
INTRAVENOUS | Status: AC
  Filled 2017-12-12: qty 1

## 2017-12-12 MED ORDER — LIDOCAINE HCL (PF) 2 % IJ SOLN
INTRAMUSCULAR | Status: AC
  Filled 2017-12-12: qty 10

## 2017-12-12 MED ORDER — MIDAZOLAM HCL 2 MG/2ML IJ SOLN
INTRAMUSCULAR | Status: AC
  Filled 2017-12-12: qty 2

## 2017-12-12 MED ORDER — FENTANYL CITRATE (PF) 100 MCG/2ML IJ SOLN
INTRAMUSCULAR | Status: AC
  Administered 2017-12-12: 25 ug via INTRAVENOUS
  Filled 2017-12-12: qty 2

## 2017-12-12 MED ORDER — PROPOFOL 10 MG/ML IV BOLUS
INTRAVENOUS | Status: AC
  Filled 2017-12-12: qty 20

## 2017-12-12 SURGICAL SUPPLY — 29 items
ADAPTER IRRIG TUBE 2 SPIKE SOL (ADAPTER) ×4 IMPLANT
BLADE SHAVER 4.5 DBL SERAT CV (CUTTER) ×2 IMPLANT
COVER MAYO STAND STRL (DRAPES) ×4 IMPLANT
CUFF TOURN 24 STER (MISCELLANEOUS) IMPLANT
CUFF TOURN 30 STER DUAL PORT (MISCELLANEOUS) ×2 IMPLANT
DRSG DERMACEA 8X12 NADH (GAUZE/BANDAGES/DRESSINGS) ×2 IMPLANT
DURAPREP 26ML APPLICATOR (WOUND CARE) ×4 IMPLANT
GAUZE SPONGE 4X4 12PLY STRL (GAUZE/BANDAGES/DRESSINGS) ×2 IMPLANT
GLOVE BIOGEL M STRL SZ7.5 (GLOVE) ×2 IMPLANT
GLOVE BIOGEL PI IND STRL 7.5 (GLOVE) ×6 IMPLANT
GLOVE BIOGEL PI INDICATOR 7.5 (GLOVE) ×6
GLOVE INDICATOR 8.0 STRL GRN (GLOVE) ×2 IMPLANT
GOWN STRL REUS W/ TWL LRG LVL3 (GOWN DISPOSABLE) ×3 IMPLANT
GOWN STRL REUS W/TWL LRG LVL3 (GOWN DISPOSABLE) ×3
IV LACTATED RINGER IRRG 3000ML (IV SOLUTION) ×6
IV LR IRRIG 3000ML ARTHROMATIC (IV SOLUTION) ×6 IMPLANT
KIT TURNOVER KIT A (KITS) ×2 IMPLANT
MANIFOLD NEPTUNE II (INSTRUMENTS) ×2 IMPLANT
NEEDLE HYPO 22GX1.5 SAFETY (NEEDLE) ×2 IMPLANT
PACK ARTHROSCOPY KNEE (MISCELLANEOUS) ×2 IMPLANT
SET TUBE SUCT SHAVER OUTFL 24K (TUBING) ×2 IMPLANT
SET TUBE TIP INTRA-ARTICULAR (MISCELLANEOUS) ×2 IMPLANT
SOL PREP PVP 2OZ (MISCELLANEOUS) ×2
SOLUTION PREP PVP 2OZ (MISCELLANEOUS) ×1 IMPLANT
SUT ETHILON 3-0 FS-10 30 BLK (SUTURE) ×2
SUTURE EHLN 3-0 FS-10 30 BLK (SUTURE) ×1 IMPLANT
TUBING ARTHRO INFLOW-ONLY STRL (TUBING) ×2 IMPLANT
WAND HAND CNTRL MULTIVAC 50 (MISCELLANEOUS) ×2 IMPLANT
WRAP KNEE W/COLD PACKS 25.5X14 (SOFTGOODS) ×2 IMPLANT

## 2017-12-12 NOTE — H&P (Signed)
The patient has been re-examined, and the chart reviewed, and there have been no interval changes to the documented history and physical.    The risks, benefits, and alternatives have been discussed at length. The patient expressed understanding of the risks benefits and agreed with plans for surgical intervention.  James P. Hooten, Jr. M.D.    

## 2017-12-12 NOTE — Anesthesia Procedure Notes (Signed)
Procedure Name: Intubation Date/Time: 12/12/2017 1:59 PM Performed by: Bernardo Heater, CRNA Pre-anesthesia Checklist: Patient identified, Emergency Drugs available, Suction available and Patient being monitored Patient Re-evaluated:Patient Re-evaluated prior to induction Oxygen Delivery Method: Circle system utilized Preoxygenation: Pre-oxygenation with 100% oxygen Induction Type: IV induction Laryngoscope Size: Mac and 3 Grade View: Grade I Tube size: 7.0 mm Number of attempts: 1 Placement Confirmation: ETT inserted through vocal cords under direct vision,  positive ETCO2 and breath sounds checked- equal and bilateral Secured at: 21 cm Tube secured with: Tape Dental Injury: Teeth and Oropharynx as per pre-operative assessment

## 2017-12-12 NOTE — Anesthesia Postprocedure Evaluation (Signed)
Anesthesia Post Note  Patient: [name] [name]  Procedure(s) Performed: ARTHROSCOPY KNEE, MEDIAL CHONDROPLASTY, LOOSE BODY REMOVAL,PARTIAL LATERAL MENISCECTOMY (Right Knee)  Patient location during evaluation: PACU Anesthesia Type: General Level of consciousness: awake and alert Pain management: pain level controlled Vital Signs Assessment: post-procedure vital signs reviewed and stable Respiratory status: spontaneous breathing and respiratory function stable Cardiovascular status: stable Anesthetic complications: no     Last Vitals:  Vitals:   12/12/17 1553 12/12/17 1554  BP:  121/68  Pulse:  89  Resp:  18  Temp: (!) 36.3 C 36.6 C  SpO2:  98%    Last Pain:  Vitals:   12/12/17 1608  TempSrc:   PainSc: 9                  Juley Giovanetti K

## 2017-12-12 NOTE — Anesthesia Post-op Follow-up Note (Signed)
Anesthesia QCDR form completed.        

## 2017-12-12 NOTE — Op Note (Signed)
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   ASSISTANT: none  ANESTHESIA: general  ESTIMATED BLOOD LOSS: Minimal  FLUIDS REPLACED: 1000 mL of crystalloid  TOURNIQUET TIME: Not used  DRAINS: none  IMPLANTS UTILIZED: None  INDICATIONS FOR SURGERY: Kristy Travis is a 52 y.o. year old female who has been seen for complaints of right knee pain. MRI demonstrated findings consistent with meniscal pathology. After discussion of the risks and benefits of surgical intervention, the patient expressed understanding of the risks benefits and agree with plans for right knee arthroscopy.   PROCEDURE IN DETAIL: The patient was brought into the operating room and, after adequate general anesthesia was achieved, a tourniquet was applied to the right thigh and the leg was placed in the leg holder. All bony prominences were well padded. The patient's right knee was cleaned and prepped with alcohol and Duraprep and draped in the usual sterile fashion. A "timeout" was performed as per usual protocol. The anticipated portal sites were injected with 0.25% Marcaine with epinephrine. An anterolateral incision was made and a cannula was inserted. A small effusion was evacuated and the knee was distended with fluid using the pump. The scope was advanced down the medial gutter into the medial compartment. Under visualization with the scope, an anteromedial portal was created and a hooked probe was inserted.  The medial meniscus was visualized and probed.  Mild fraying was noted along the inner aspect of the medial meniscus without a frank tear.  These areas were debrided using meniscal punches, 4.5 mm incisor shaver, and then contoured with the 50 degree ArthroCare wand.  Multiple chondral loose bodies were encountered in the medial compartment and later in the suprapatellar pouch.  These chondral loose bodies were removed using the incisor shaver.  The articular cartilage was visualized.  There were areas of grade III chondromalacia involving the medial femoral condyle.  These areas were debrided and contoured using the ArthroCare wand.  The scope was then advanced into the intercondylar notch. The anterior cruciate ligament was visualized and probed and felt to be intact. The scope was removed from the lateral portal and reinserted via the anteromedial portal to better visualize the lateral compartment. The lateral meniscus was visualized and probed.  There was a complex tear involving the anterior horn of the lateral meniscus.  The tear was debrided using a 4.5 mm incisor shaver and then contoured using the ArthroCare wand.  The remaining rim of meniscus was visualized improved felt to be stable.  The articular cartilage of the lateral compartment was visualized and noted to be in good condition.  Finally, the scope was advanced so as to visualize the patellofemoral articulation. Good patellar tracking was appreciated.  There were grade 3 changes of chondromalacia involving the facets of the patella as well as the trochlear groove.  Some areas of articular cartilage were being lifted off of the femur.  These areas were debrided and contoured using the ArthroCare wand.  The knee was irrigated with copius amounts of fluid and  suctioned dry. The anterolateral portal was re-approximated with #3-0 nylon. A combination of 0.25% Marcaine with epinephrine and 4 mg of Morphine were injected via the scope. The scope was  removed and the anteromedial portal was re-approximated with #3-0 nylon. A sterile dressing was applied followed by application of an ice wrap.  The patient tolerated the procedure well and was transported to the PACU in stable condition.  James P. Angie FavaHooten, Jr., M.D.

## 2017-12-12 NOTE — Discharge Instructions (Addendum)
°  Instructions after Knee Arthroscopy  ° °- James P. Hooten, Jr., M.D.    ° Dept. of Orthopaedics & Sports Medicine ° [facility] ° [address] ° [place], [place]  [address] ° ° Phone: [phone]   Fax: [phone] ° ° °DIET: °• Drink plenty of non-alcoholic fluids & begin a light diet. °• Resume your normal diet the day after surgery. ° °ACTIVITY:  °• You may use crutches or a walker with weight-bearing as tolerated, unless instructed otherwise. °• You may wean yourself off of the walker or crutches as tolerated.  °• Begin doing gentle exercises. Exercising will reduce the pain and swelling, increase motion, and prevent muscle weakness.   °• Avoid strenuous activities or athletics for a minimum of 4-6 weeks after arthroscopic surgery. °• Do not drive or operate any equipment until instructed. ° °WOUND CARE:  °• Place one to two pillows under the knee the first day or two when sitting or lying.  °• Continue to use the ice packs periodically to reduce pain and swelling. °• The small incisions in your knee are closed with nylon stitches. The stitches will be removed in the office. °• The bulky dressing may be removed on the second day after surgery. DO NOT TOUCH THE STITCHES. Put a Band-Aid over each stitch. Do NOT use any ointments or creams on the incisions.  °• You may bathe or shower after the stitches are removed at the first office visit following surgery. ° °MEDICATIONS: °• You may resume your regular medications. °• Please take the pain medication as prescribed. °• Do not take pain medication on an empty stomach. °• Do not drive or drink alcoholic beverages when taking pain medications. ° °CALL THE OFFICE FOR: °• Temperature above 101 degrees °• Excessive bleeding or drainage on the dressing. °• Excessive swelling, coldness, or paleness of the toes. °• Persistent nausea and vomiting. ° °FOLLOW-UP:  °• You should have an appointment to return to the office in 7-10 days after surgery.   ° ° °AMBULATORY SURGERY  °DISCHARGE INSTRUCTIONS ° ° °1) The drugs that you were given will stay in your system until tomorrow so for the next 24 hours you should not: ° °A) Drive an automobile °B) Make any legal decisions °C) Drink any alcoholic beverage ° ° °2) You may resume regular meals tomorrow.  Today it is better to start with liquids and gradually work up to solid foods. ° °You may eat anything you prefer, but it is better to start with liquids, then soup and crackers, and gradually work up to solid foods. ° ° °3) Please notify your doctor immediately if you have any unusual bleeding, trouble breathing, redness and pain at the surgery site, drainage, fever, or pain not relieved by medication. ° ° ° °4) Additional Instructions: ° ° ° ° ° ° ° °Please contact your physician with any problems or Same Day Surgery at [phone], Monday through Friday 6 am to 4 pm, or [facility] number at [phone]. ° °

## 2017-12-12 NOTE — Transfer of Care (Signed)
Immediate Anesthesia Transfer of Care Note  Patient: Kristy Travis  Procedure(s) Performed: ARTHROSCOPY KNEE, MEDIAL CHONDROPLASTY, LOOSE BODY REMOVAL,PARTIAL LATERAL MENISCECTOMY (Right Knee)  Patient Location: PACU  Anesthesia Type:General  Level of Consciousness: sedated and responds to stimulation  Airway & Oxygen Therapy: Patient Spontanous Breathing and Patient connected to nasal cannula oxygen  Post-op Assessment: Report given to RN and Post -op Vital signs reviewed and stable  Post vital signs: Reviewed and stable  Last Vitals:  Vitals Value Taken Time  BP 121/68 12/12/2017  3:54 PM  Temp    Pulse 94 12/12/2017  3:54 PM  Resp 21 12/12/2017  3:54 PM  SpO2 99 % 12/12/2017  3:54 PM  Vitals shown include unvalidated device data.  Last Pain:  Vitals:   12/12/17 1137  TempSrc: Temporal  PainSc: 4          Complications: No apparent anesthesia complications

## 2017-12-12 NOTE — Anesthesia Preprocedure Evaluation (Addendum)
Anesthesia Evaluation  Patient identified by MRN, date of birth, ID band Patient awake    Reviewed: Allergy & Precautions, NPO status , Patient's Chart, lab work & pertinent test results  History of Anesthesia Complications (+) PONV and history of anesthetic complications  Airway Mallampati: II  TM Distance: >3 FB     Dental   Pulmonary neg pulmonary ROS,    Pulmonary exam normal        Cardiovascular hypertension, Normal cardiovascular exam     Neuro/Psych  Neuromuscular disease negative psych ROS   GI/Hepatic Neg liver ROS, GERD  ,  Endo/Other  negative endocrine ROS  Renal/GU   negative genitourinary   Musculoskeletal  (+) Arthritis , Osteoarthritis,    Abdominal Normal abdominal exam  (+)   Peds negative pediatric ROS (+)  Hematology negative hematology ROS (+)   Anesthesia Other Findings   Reproductive/Obstetrics                            Anesthesia Physical Anesthesia Plan  ASA: II  Anesthesia Plan: General   Post-op Pain Management:    Induction: Intravenous  PONV Risk Score and Plan:   Airway Management Planned: Oral ETT  Additional Equipment:   Intra-op Plan:   Post-operative Plan: Extubation in OR  Informed Consent: I have reviewed the patients History and Physical, chart, labs and discussed the procedure including the risks, benefits and alternatives for the proposed anesthesia with the patient or authorized representative who has indicated his/her understanding and acceptance.   Dental advisory given  Plan Discussed with: CRNA and Surgeon  Anesthesia Plan Comments:         Anesthesia Quick Evaluation

## 2017-12-12 NOTE — Anesthesia Post-op Follow-up Note (Deleted)
Anesthesia QCDR form completed.        

## 2017-12-12 NOTE — Progress Notes (Signed)
Pt has rec'd 150mcg Fentanyl and two tabs Norco.  Offered to call anesthesia to get an order for more pain med, but pt wants to try and get oob to chair and see if that helps manage pain.  Lurene ShadowBAllen, RN

## 2017-12-13 ENCOUNTER — Encounter: Payer: Self-pay | Admitting: Orthopedic Surgery

## 2018-01-16 ENCOUNTER — Encounter: Payer: Self-pay | Admitting: Family Medicine

## 2018-01-16 ENCOUNTER — Ambulatory Visit (INDEPENDENT_AMBULATORY_CARE_PROVIDER_SITE_OTHER): Payer: 59 | Admitting: Family Medicine

## 2018-01-16 VITALS — BP 120/78 | HR 91 | Resp 16 | Ht 65.0 in | Wt 211.0 lb

## 2018-01-16 DIAGNOSIS — E7849 Other hyperlipidemia: Secondary | ICD-10-CM | POA: Diagnosis not present

## 2018-01-16 DIAGNOSIS — Z800 Family history of malignant neoplasm of digestive organs: Secondary | ICD-10-CM

## 2018-01-16 DIAGNOSIS — Z0000 Encounter for general adult medical examination without abnormal findings: Secondary | ICD-10-CM

## 2018-01-16 DIAGNOSIS — E8881 Metabolic syndrome: Secondary | ICD-10-CM | POA: Diagnosis not present

## 2018-01-16 DIAGNOSIS — Z1211 Encounter for screening for malignant neoplasm of colon: Secondary | ICD-10-CM | POA: Diagnosis not present

## 2018-01-16 DIAGNOSIS — E559 Vitamin D deficiency, unspecified: Secondary | ICD-10-CM | POA: Diagnosis not present

## 2018-01-16 MED ORDER — PHENTERMINE HCL 37.5 MG PO TABS: 37.5000 mg | ORAL_TABLET | Freq: Every day | ORAL | 1 refills | Status: DC

## 2018-01-16 NOTE — Patient Instructions (Addendum)
F/U in 4 months, call if you need me sooner  You are referred to Dr Laural Golden for a colonoscopy, his office will mail you a letter to call and schedule an appointment, so please open the letter!  New to help with weight  Loss as you change food choice is HALF phentermine daily Eat on a regular schedule Water only, 64 ounces daily Stop the candy please!!  Please consider Weight watchers as  A long term group to help with weight management  Urine being checked for infection/ abnormality   Please get fasting labs at [facility] as soon as possible cBC, lipid, cmp and eGFR, TSH and vit D also HBA1C  Hope right knee pain improves soon

## 2018-01-17 ENCOUNTER — Encounter (INDEPENDENT_AMBULATORY_CARE_PROVIDER_SITE_OTHER): Payer: Self-pay | Admitting: *Deleted

## 2018-01-21 ENCOUNTER — Encounter: Payer: Self-pay | Admitting: Family Medicine

## 2018-01-21 NOTE — Assessment & Plan Note (Signed)
Deteriorated. Patient re-educated about  the importance of commitment to a  minimum of 150 minutes of exercise per week.  The importance of healthy food choices with portion control discussed. Encouraged to start a food diary, count calories and to consider  joining a support group. Sample diet sheets offered. Goals set by the patient for the next several months.   Weight /BMI 01/16/2018 12/12/2017 12/01/2017  WEIGHT 211 lb 207 lb 3.2 oz 205 lb  HEIGHT 5\' 5"  5\' 5"  5\' 5"   BMI 35.11 kg/m2 34.48 kg/m2 34.11 kg/m2  start half phentermine daily and weight watchers encouraged also

## 2018-01-21 NOTE — Assessment & Plan Note (Signed)

## 2018-01-21 NOTE — Progress Notes (Signed)
    Kristy Carasamela Janel Travis     MRN: [id number]      DOB: 02-29-1966  HPI:, especially as she has had b Patient is in for annual physical exam. C/o excess left knee pain and swelling following surgery approximately 2 months ago limiting movement C/o weight regain which is very concerning to her especially as she had bariatric surgery in the past, wants to resume phentermine to help with weight loss , has in the past Recent labs,  are reviewed. Immunization is reviewed , and  updated if needed.   PE: BP 120/78   Pulse 91   Resp 16   Ht 5\' 5"  (1.651 m)   Wt 211 lb (95.7 kg)   LMP 06/24/2014   SpO2 99%   BMI 35.11 kg/m  Pleasant  female, alert and oriented x 3, in no cardio-pulmonary distress.Pt in pain from swollen left knee Afebrile. HEENT No facial trauma or asymetry. Sinuses non tender.  Extra occullar muscles intact, . External ears normal, tympanic membranes clear. Oropharynx moist, no exudate. Neck: supple, no adenopathy,JVD or thyromegaly.No bruits.  Chest: Clear to ascultation bilaterally.No crackles or wheezes. Non tender to palpation  Breast: No asymetry,no masses or lumps. No tenderness. No nipple discharge or inversion. No axillary or supraclavicular adenopathy  Cardiovascular system; Heart sounds normal,  S1 and  S2 ,no S3.  No murmur, or thrill. Apical beat not displaced Peripheral pulses normal.  Abdomen: Soft, non tender, no organomegaly or masses. No bruits. Bowel sounds normal. No guarding, tenderness or rebound.  Rectal:  Colonoscopy planned in several weeks  GU: No exam needed   Musculoskeletal exam: Full ROM of spine, hips , shoulders and right knee, markedly  Reduced in left knee which is swollen and tender No deformity ,swelling or crepitus noted. No muscle wasting or atrophy.   Neurologic: Cranial nerves 2 to 12 intact. Power, tone ,sensation normal throughout. . No tremor.  Skin: Intact, no ulceration, erythema , scaling or rash  noted. Pigmentation normal throughout  Psych; Normal mood and affect. Judgement and concentration normal   Assessment & Plan:  Annual physical exam Annual exam as documented. Counseling done  re healthy lifestyle involving commitment to 150 minutes exercise per week, heart healthy diet, and attaining healthy weight.The importance of adequate sleep also discussed. Regular seat belt use and home safety, is also discussed. Changes in health habits are decided on by the patient with goals and time frames  set for achieving them. Immunization and cancer screening needs are specifically addressed at this visit.   Morbid obesity (HCC) Deteriorated. Patient re-educated about  the importance of commitment to a  minimum of 150 minutes of exercise per week.  The importance of healthy food choices with portion control discussed. Encouraged to start a food diary, count calories and to consider  joining a support group. Sample diet sheets offered. Goals set by the patient for the next several months.   Weight /BMI 01/16/2018 12/12/2017 12/01/2017  WEIGHT 211 lb 207 lb 3.2 oz 205 lb  HEIGHT 5\' 5"  5\' 5"  5\' 5"   BMI 35.11 kg/m2 34.48 kg/m2 34.11 kg/m2  start half phentermine daily and weight watchers encouraged also

## 2018-01-26 DIAGNOSIS — E8881 Metabolic syndrome: Secondary | ICD-10-CM | POA: Diagnosis not present

## 2018-01-26 DIAGNOSIS — E559 Vitamin D deficiency, unspecified: Secondary | ICD-10-CM | POA: Diagnosis not present

## 2018-01-26 DIAGNOSIS — E7849 Other hyperlipidemia: Secondary | ICD-10-CM | POA: Diagnosis not present

## 2018-01-27 ENCOUNTER — Encounter: Payer: Self-pay | Admitting: Family Medicine

## 2018-01-27 ENCOUNTER — Other Ambulatory Visit: Payer: Self-pay | Admitting: Family Medicine

## 2018-01-27 LAB — LIPID PANEL
Chol/HDL Ratio: 3.2 ratio (ref 0.0–4.4)
Cholesterol, Total: 237 mg/dL — ABNORMAL HIGH (ref 100–199)
HDL: 75 mg/dL (ref >39)
LDL Calculated: 145 mg/dL — ABNORMAL HIGH (ref 0–99)
Triglycerides: 86 mg/dL (ref 0–149)
VLDL Cholesterol Cal: 17 mg/dL (ref 5–40)

## 2018-01-27 LAB — CMP14+EGFR
ALT: 14 IU/L (ref 0–32)
AST: 18 IU/L (ref 0–40)
Albumin/Globulin Ratio: 2 (ref 1.2–2.2)
Albumin: 4.5 g/dL (ref 3.5–5.5)
Alkaline Phosphatase: 77 IU/L (ref 39–117)
BUN/Creatinine Ratio: 15 (ref 9–23)
BUN: 11 mg/dL (ref 6–24)
Bilirubin Total: 0.3 mg/dL (ref 0.0–1.2)
CO2: 25 mmol/L (ref 20–29)
Calcium: 9.7 mg/dL (ref 8.7–10.2)
Chloride: 101 mmol/L (ref 96–106)
Creatinine, Ser: 0.75 mg/dL (ref 0.57–1.00)
GFR calc Af Amer: 107 mL/min/{1.73_m2} (ref >59)
GFR calc non Af Amer: 93 mL/min/{1.73_m2} (ref >59)
Globulin, Total: 2.3 g/dL (ref 1.5–4.5)
Glucose: 91 mg/dL (ref 65–99)
Potassium: 4.7 mmol/L (ref 3.5–5.2)
Sodium: 141 mmol/L (ref 134–144)
Total Protein: 6.8 g/dL (ref 6.0–8.5)

## 2018-01-27 LAB — CBC
Hematocrit: 37.7 % (ref 34.0–46.6)
Hemoglobin: 12.1 g/dL (ref 11.1–15.9)
MCH: 25.9 pg — ABNORMAL LOW (ref 26.6–33.0)
MCHC: 32.1 g/dL (ref 31.5–35.7)
MCV: 81 fL (ref 79–97)
Platelets: 270 10*3/uL (ref 150–450)
RBC: 4.67 x10E6/uL (ref 3.77–5.28)
RDW: 14.6 % (ref 12.3–15.4)
WBC: 4.6 10*3/uL (ref 3.4–10.8)

## 2018-01-27 LAB — VITAMIN D 25 HYDROXY (VIT D DEFICIENCY, FRACTURES): Vit D, 25-Hydroxy: 22.3 ng/mL — ABNORMAL LOW (ref 30.0–100.0)

## 2018-01-27 LAB — HEMOGLOBIN A1C
Est. average glucose Bld gHb Est-mCnc: 120 mg/dL
Hgb A1c MFr Bld: 5.8 % — ABNORMAL HIGH (ref 4.8–5.6)

## 2018-01-27 LAB — TSH: TSH: 2.5 u[IU]/mL (ref 0.450–4.500)

## 2018-01-27 MED ORDER — ERGOCALCIFEROL 1.25 MG (50000 UT) PO CAPS: 50000.0000 [IU] | ORAL_CAPSULE | ORAL | 3 refills | Status: DC

## 2018-02-21 ENCOUNTER — Telehealth: Payer: 59 | Admitting: Physician Assistant

## 2018-02-21 DIAGNOSIS — N3000 Acute cystitis without hematuria: Secondary | ICD-10-CM

## 2018-02-21 MED ORDER — NITROFURANTOIN MONOHYD MACRO 100 MG PO CAPS: 100.0000 mg | ORAL_CAPSULE | Freq: Two times a day (BID) | ORAL | 0 refills | Status: AC

## 2018-02-21 NOTE — Progress Notes (Signed)

## 2018-03-06 ENCOUNTER — Telehealth: Payer: 59 | Admitting: Family

## 2018-03-06 DIAGNOSIS — J069 Acute upper respiratory infection, unspecified: Secondary | ICD-10-CM | POA: Diagnosis not present

## 2018-03-06 DIAGNOSIS — B9789 Other viral agents as the cause of diseases classified elsewhere: Secondary | ICD-10-CM

## 2018-03-06 MED ORDER — BENZONATATE 100 MG PO CAPS: 100.0000 mg | ORAL_CAPSULE | Freq: Three times a day (TID) | ORAL | 0 refills | Status: DC | PRN

## 2018-03-06 MED ORDER — FLUTICASONE PROPIONATE 50 MCG/ACT NA SUSP: 2.0000 | Freq: Every day | NASAL | 6 refills | Status: DC

## 2018-03-06 NOTE — Progress Notes (Signed)

## 2018-03-31 ENCOUNTER — Encounter: Payer: Self-pay | Admitting: Obstetrics and Gynecology

## 2018-03-31 ENCOUNTER — Ambulatory Visit (INDEPENDENT_AMBULATORY_CARE_PROVIDER_SITE_OTHER): Payer: 59 | Admitting: Obstetrics and Gynecology

## 2018-03-31 VITALS — BP 126/80 | HR 102 | Ht 65.0 in | Wt 204.5 lb

## 2018-03-31 DIAGNOSIS — Z1231 Encounter for screening mammogram for malignant neoplasm of breast: Secondary | ICD-10-CM

## 2018-03-31 DIAGNOSIS — Z1211 Encounter for screening for malignant neoplasm of colon: Secondary | ICD-10-CM | POA: Diagnosis not present

## 2018-03-31 DIAGNOSIS — N898 Other specified noninflammatory disorders of vagina: Secondary | ICD-10-CM

## 2018-03-31 DIAGNOSIS — Z01419 Encounter for gynecological examination (general) (routine) without abnormal findings: Secondary | ICD-10-CM

## 2018-03-31 DIAGNOSIS — Z113 Encounter for screening for infections with a predominantly sexual mode of transmission: Secondary | ICD-10-CM

## 2018-03-31 DIAGNOSIS — E785 Hyperlipidemia, unspecified: Secondary | ICD-10-CM

## 2018-03-31 DIAGNOSIS — N951 Menopausal and female climacteric states: Secondary | ICD-10-CM | POA: Diagnosis not present

## 2018-03-31 NOTE — Progress Notes (Signed)
PT is present today for her annual exam. Pt stated that she has been doing self-breast exams monthly. Pt stated that she is having hot flashes and sweating at night. Pt stated that she having a hard time sleeping. Pt wants a full STD test.

## 2018-03-31 NOTE — Progress Notes (Signed)
ANNUAL PREVENTATIVE CARE GYN  ENCOUNTER NOTE  Subjective:       [name] Janel Dunivan is a 52 y.o. [phone] female here for a routine annual gynecologic exam.  The patient wears seatbelts.  She has never had a blood transfusion.  The patient does not engage in regular exercise.  She is curently sexually active.    Current complaints: 1.  Vasomotor symptoms (night sweats, hot flushes,  .  Notes symptoms have been ongoing for several months. Has tried OTC herbal remedies.  She does report having similar symptoms in the past  ~ 2 years ago, although discontinued shortly after initiation due to an abnormal mammogram (although biopsy was benign).   Gynecologic History Patient's last menstrual period was 06/24/2014. Contraception: status post hysterectomy Last Pap: 11/2012. Results were: normal.  Denies h/o abnormal pap smears.  Last mammogram: 08/03/2017. Results were: normal.  Last colonoscopy: Patient has never had one.    Obstetric History OB History  Gravida Para Term Preterm AB Living  3 2 2   1 2   SAB TAB Ectopic Multiple Live Births  1            # Outcome Date GA Lbr Len/2nd Weight Sex Delivery Anes PTL Lv  3 SAB           2 Term      Vag-Spont     1 Term      Vag-Spont       Past Medical History:  Diagnosis Date  . Arthritis   . Carpal tunnel syndrome   . Chronic fatigue   . GERD (gastroesophageal reflux disease)   . GERD (gastroesophageal reflux disease) 04/06/2017  . Hypertension    per patient this was prior to weight loss surgery/cn 12/01/17  . Obesity   . Pancreatitis 07/2014   had  gall bladder removed , spent 1 week in hospital  . PONV (postoperative nausea and vomiting)     Past Surgical History:  Procedure Laterality Date  . ABDOMINAL HYSTERECTOMY    . 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 CHOLECYSTECTOMY  07/2014  . cyst removed from left foot and bone removed from left foot  April and June 2010  . DILATION AND CURETTAGE OF UTERUS    .  ESOPHAGOGASTRODUODENOSCOPY N/A 03/03/2016   Procedure: ESOPHAGOGASTRODUODENOSCOPY (EGD);  Surgeon: Malissa HippoNajeeb U Rehman, MD;  Location: [facility];  Service: Endoscopy;  Laterality: N/A;  3:00 - moved to 2:00 - Ann to notify pt  . KNEE ARTHROSCOPY Right 12/12/2017   Procedure: ARTHROSCOPY KNEE, MEDIAL CHONDROPLASTY, LOOSE BODY REMOVAL,PARTIAL LATERAL MENISCECTOMY;  Surgeon: Donato HeinzHooten, James P, MD;  Location: [facility];  Service: Orthopedics;  Laterality: Right;  . laparoscopy to eval dyspepsia  2004  . TOTAL VAGINAL HYSTERECTOMY  06/2014   with BSO     Current Outpatient Medications on File Prior to Visit  Medication Sig Dispense Refill  . ergocalciferol (VITAMIN D2) 50000 units capsule Take 1 capsule (50,000 Units total) by mouth once a week. One capsule once weekly 12 capsule 3  . fluticasone (FLONASE) 50 MCG/ACT nasal spray Place 2 sprays into both nostrils daily. 16 g 6  . Multiple Vitamins-Minerals (ONE-A-DAY ENERGY PO) Take 1 tablet by mouth daily.    . pantoprazole (PROTONIX) 40 MG tablet Take 1 tablet (40 mg total) by mouth 2 (two) times daily before a meal. 60 tablet 3  . phentermine (ADIPEX-P) 37.5 MG tablet Take 1 tablet (37.5 mg total) by mouth daily before breakfast.  30 tablet 1   No current facility-administered medications on file prior to visit.     Allergies  Allergen Reactions  . Maxzide [Triamterene-Hctz] Nausea Only    Social History   Socioeconomic History  . Marital status: Married    Spouse name: Not on file  . Number of children: Not on file  . Years of education: Not on file  . Highest education level: Not on file  Occupational History  . Not on file  Social Needs  . Financial resource strain: Not on file  . Food insecurity:    Worry: Not on file    Inability: Not on file  . Transportation needs:    Medical: Not on file    Non-medical: Not on file  Tobacco Use  . Smoking status: Never Smoker  . Smokeless tobacco: Never Used  Substance and Sexual Activity   . Alcohol use: Not Currently    Comment: occasionally  . Drug use: No  . Sexual activity: Yes    Birth control/protection: Surgical  Lifestyle  . Physical activity:    Days per week: Not on file    Minutes per session: Not on file  . Stress: Not on file  Relationships  . Social connections:    Talks on phone: Not on file    Gets together: Not on file    Attends religious service: Not on file    Active member of club or organization: Not on file    Attends meetings of clubs or organizations: Not on file    Relationship status: Not on file  . Intimate partner violence:    Fear of current or ex partner: Not on file    Emotionally abused: Not on file    Physically abused: Not on file    Forced sexual activity: Not on file  Other Topics Concern  . Not on file  Social History Narrative  . Not on file    Family History  Problem Relation Age of Onset  . Thrombosis Mother   . Diabetes Father   . Pulmonary Hypertension Father   . Breast cancer Neg Hx   . Bladder Cancer Neg Hx   . Kidney cancer Neg Hx       Review of Systems ROS Review of Systems - General ROS: negative for - chills, fatigue, fever, hot flashes, weight gain or weight loss Psychological ROS: negative for - anxiety, decreased libido, depression, mood swings, physical abuse or sexual abuse. Positive for sleep disturbances (problems staying asleep, only sleeping 3-4 hrs nightly).  Ophthalmic ROS: negative for - blurry vision, eye pain or loss of vision ENT ROS: negative for - headaches, hearing change, visual changes or vocal changes Allergy and Immunology ROS: negative for - hives, itchy/watery eyes or seasonal allergies Hematological and Lymphatic ROS: negative for - bleeding problems, bruising, swollen lymph nodes or weight loss Endocrine ROS: positive for  hot flashes, night sweats; negative for - galactorrhea, hair pattern changes, malaise/lethargy, mood swings, palpitations, polydipsia/polyuria, skin  changes, temperature intolerance or unexpected weight changes Breast ROS: negative for - new or changing breast lumps or nipple discharge Respiratory ROS: negative for - cough or shortness of breath Cardiovascular ROS: negative for - chest pain, irregular heartbeat, palpitations or shortness of breath Gastrointestinal ROS: no abdominal pain, change in bowel habits, or black or bloody stools Genito-Urinary ROS: positive for vaginal irritation; negative for dysuria, trouble voiding, or hematuria Musculoskeletal ROS: negative for - joint pain or joint stiffness Neurological ROS: negative for -  bowel and bladder control changes Dermatological ROS: negative for rash and skin lesion changes   Objective:   BP 126/80   Pulse (!) 102   Ht 5\' 5"  (1.651 m)   Wt 204 lb 8 oz (92.8 kg)   LMP 06/24/2014   BMI 34.03 kg/m    CONSTITUTIONAL: Well-developed, well-nourished female in no acute distress. Mild obesity PSYCHIATRIC: Normal mood and affect. Normal behavior. Normal judgment and thought content. NEUROLGIC: Alert and oriented to person, place, and time. Normal muscle tone coordination. No cranial nerve deficit noted. HENT:  Normocephalic, atraumatic, External right and left ear normal. Oropharynx is clear and moist EYES: Conjunctivae and EOM are normal. Pupils are equal, round, and reactive to light. No scleral icterus.  NECK: Normal range of motion, supple, no masses.  Normal thyroid.  SKIN: Skin is warm and dry. No rash noted. Not diaphoretic. No erythema. No pallor. CARDIOVASCULAR: Normal heart rate noted, regular rhythm, no murmur. RESPIRATORY: Clear to auscultation bilaterally. Effort and breath sounds normal, no problems with respiration noted. BREASTS: Symmetric in size. No masses, skin changes, nipple drainage, or lymphadenopathy. ABDOMEN: Soft, normal bowel sounds, no distention noted.  No tenderness, rebound or guarding.  BLADDER: Normal PELVIC:  Bladder no bladder distension  noted  Urethra: normal appearing urethra with no masses, tenderness or lesions  Vulva: normal appearing vulva with no masses, tenderness or lesions  Vagina: normal appearing vagina with normal color, no lesions.  Scant thin white discharge, no odor  Cervix: surgically absent  Uterus: not indicated and surgically absent, vaginal cuff well healed  Adnexa: surgically absent bilateral  Rectal: not indicated MUSCULOSKELETAL: Normal range of motion. No tenderness.  No cyanosis, clubbing, or edema.  2+ distal pulses. LYMPHATIC: No Axillary, Supraclavicular, or Inguinal Adenopathy.   Microscopic wet-mount exam shows normal epithelial cells. No clue cells, WBCs, trichomonads, or yeast.  Normal epithelial cells.  KOH done.    Labs:  Lab Results  Component Value Date   WBC 4.6 01/26/2018   HGB 12.1 01/26/2018   HCT 37.7 01/26/2018   MCV 81 01/26/2018   PLT 270 01/26/2018    Lab Results  Component Value Date   CREATININE 0.75 01/26/2018   BUN 11 01/26/2018   NA 141 01/26/2018   K 4.7 01/26/2018   CL 101 01/26/2018   CO2 25 01/26/2018   Lab Results  Component Value Date   ALT 14 01/26/2018   AST 18 01/26/2018   ALKPHOS 77 01/26/2018   BILITOT 0.3 01/26/2018    Lab Results  Component Value Date   TSH 2.500 01/26/2018    Lab Results  Component Value Date   CHOL 237 (H) 01/26/2018   HDL 75 01/26/2018   LDLCALC 145 (H) 01/26/2018   TRIG 86 01/26/2018   CHOLHDL 3.2 01/26/2018   Lab Results  Component Value Date   HGBA1C 5.8 (H) 01/26/2018    Assessment:   Annual gynecologic examination 52 y.o. Contraception: status post hysterectomy Obesity 1  Vasomotor symptoms Vaginal irritation Dyslipdemia STD screening.   Plan:   - Pap: Not needed.  Patient s/p hysterectomy with no h/o abnormal pap smears in the past.  - Mammogram: Up to date. Next due in January 2020.  - Stool Guaiac Testing:  Not Indicated.   To be scheduled for colonoscopy.  - Labs: previously  performed.   - Routine preventative health maintenance measures emphasized: Exercise/Diet/Weight control, Alcohol/Substance use risks and Stress Management - Desires STI testing. Will order. Does not need GC/Cl due to  absence of cervix.  - Patient with bothersome menopausal vasomotor symptoms. Discussed lifestyle interventions such as wearing light clothing, remaining in cool environments, having fan/air conditioner in the room, avoiding hot beverages etc.  Discussed using hormone therapy and concerns about increased risk of heart disease, cerebrovascular disease, thromboembolic disease,  and breast cancer.  Also discussed other medical options such as Clonidine, Paxil, Effexor or Neurontin.  After discussion of risks, benefits, patient ok to resume Premarin. Prescribe 0.625 mg daily. To f/u in 6 weeks for reassessment of symptoms.  - Vaginal irritation, may be secondary to mild vaginal dryness due to menopause. Negative wet pre on today. Symptoms may be helped with initiation of HRT.  -Dyslipidemia, managed by PCP.  - Flu vaccine  Return to Clinic - 1 Year for annual exam.    Hildred Laser, MD [facility]

## 2018-03-31 NOTE — Patient Instructions (Signed)
Health Maintenance for Postmenopausal Women Menopause is a normal process in which your reproductive ability comes to an end. This process happens gradually over a span of months to years, usually between the ages of 22 and 9. Menopause is complete when you have missed 12 consecutive menstrual periods. It is important to talk with your health care provider about some of the most common conditions that affect postmenopausal women, such as heart disease, cancer, and bone loss (osteoporosis). Adopting a healthy lifestyle and getting preventive care can help to promote your health and wellness. Those actions can also lower your chances of developing some of these common conditions. What should I know about menopause? During menopause, you may experience a number of symptoms, such as:  Moderate-to-severe hot flashes.  Night sweats.  Decrease in sex drive.  Mood swings.  Headaches.  Tiredness.  Irritability.  Memory problems.  Insomnia.  Choosing to treat or not to treat menopausal changes is an individual decision that you make with your health care provider. What should I know about hormone replacement therapy and supplements? Hormone therapy products are effective for treating symptoms that are associated with menopause, such as hot flashes and night sweats. Hormone replacement carries certain risks, especially as you become older. If you are thinking about using estrogen or estrogen with progestin treatments, discuss the benefits and risks with your health care provider. What should I know about heart disease and stroke? Heart disease, heart attack, and stroke become more likely as you age. This may be due, in part, to the hormonal changes that your body experiences during menopause. These can affect how your body processes dietary fats, triglycerides, and cholesterol. Heart attack and stroke are both medical emergencies. There are many things that you can do to help prevent heart disease  and stroke:  Have your blood pressure checked at least every 1-2 years. High blood pressure causes heart disease and increases the risk of stroke.  If you are 53-22 years old, ask your health care provider if you should take aspirin to prevent a heart attack or a stroke.  Do not use any tobacco products, including cigarettes, chewing tobacco, or electronic cigarettes. If you need help quitting, ask your health care provider.  It is important to eat a healthy diet and maintain a healthy weight. ? Be sure to include plenty of vegetables, fruits, low-fat dairy products, and lean protein. ? Avoid eating foods that are high in solid fats, added sugars, or salt (sodium).  Get regular exercise. This is one of the most important things that you can do for your health. ? Try to exercise for at least 150 minutes each week. The type of exercise that you do should increase your heart rate and make you sweat. This is known as moderate-intensity exercise. ? Try to do strengthening exercises at least twice each week. Do these in addition to the moderate-intensity exercise.  Know your numbers.Ask your health care provider to check your cholesterol and your blood glucose. Continue to have your blood tested as directed by your health care provider.  What should I know about cancer screening? There are several types of cancer. Take the following steps to reduce your risk and to catch any cancer development as early as possible. Breast Cancer  Practice breast self-awareness. ? This means understanding how your breasts normally appear and feel. ? It also means doing regular breast self-exams. Let your health care provider know about any changes, no matter how small.  If you are 40  or older, have a clinician do a breast exam (clinical breast exam or CBE) every year. Depending on your age, family history, and medical history, it may be recommended that you also have a yearly breast X-ray (mammogram).  If you  have a family history of breast cancer, talk with your health care provider about genetic screening.  If you are at high risk for breast cancer, talk with your health care provider about having an MRI and a mammogram every year.  Breast cancer (BRCA) gene test is recommended for women who have family members with BRCA-related cancers. Results of the assessment will determine the need for genetic counseling and BRCA1 and for BRCA2 testing. BRCA-related cancers include these types: ? Breast. This occurs in males or females. ? Ovarian. ? Tubal. This may also be called fallopian tube cancer. ? Cancer of the abdominal or pelvic lining (peritoneal cancer). ? Prostate. ? Pancreatic.  Cervical, Uterine, and Ovarian Cancer Your health care provider may recommend that you be screened regularly for cancer of the pelvic organs. These include your ovaries, uterus, and vagina. This screening involves a pelvic exam, which includes checking for microscopic changes to the surface of your cervix (Pap test).  For women ages 21-65, health care providers may recommend a pelvic exam and a Pap test every three years. For women ages 79-65, they may recommend the Pap test and pelvic exam, combined with testing for human papilloma virus (HPV), every five years. Some types of HPV increase your risk of cervical cancer. Testing for HPV may also be done on women of any age who have unclear Pap test results.  Other health care providers may not recommend any screening for nonpregnant women who are considered low risk for pelvic cancer and have no symptoms. Ask your health care provider if a screening pelvic exam is right for you.  If you have had past treatment for cervical cancer or a condition that could lead to cancer, you need Pap tests and screening for cancer for at least 20 years after your treatment. If Pap tests have been discontinued for you, your risk factors (such as having a new sexual partner) need to be  reassessed to determine if you should start having screenings again. Some women have medical problems that increase the chance of getting cervical cancer. In these cases, your health care provider may recommend that you have screening and Pap tests more often.  If you have a family history of uterine cancer or ovarian cancer, talk with your health care provider about genetic screening.  If you have vaginal bleeding after reaching menopause, tell your health care provider.  There are currently no reliable tests available to screen for ovarian cancer.  Lung Cancer Lung cancer screening is recommended for adults 69-62 years old who are at high risk for lung cancer because of a history of smoking. A yearly low-dose CT scan of the lungs is recommended if you:  Currently smoke.  Have a history of at least 30 pack-years of smoking and you currently smoke or have quit within the past 15 years. A pack-year is smoking an average of one pack of cigarettes per day for one year.  Yearly screening should:  Continue until it has been 15 years since you quit.  Stop if you develop a health problem that would prevent you from having lung cancer treatment.  Colorectal Cancer  This type of cancer can be detected and can often be prevented.  Routine colorectal cancer screening usually begins at  age 42 and continues through age 45.  If you have risk factors for colon cancer, your health care provider may recommend that you be screened at an earlier age.  If you have a family history of colorectal cancer, talk with your health care provider about genetic screening.  Your health care provider may also recommend using home test kits to check for hidden blood in your stool.  A small camera at the end of a tube can be used to examine your colon directly (sigmoidoscopy or colonoscopy). This is done to check for the earliest forms of colorectal cancer.  Direct examination of the colon should be repeated every  5-10 years until age 71. However, if early forms of precancerous polyps or small growths are found or if you have a family history or genetic risk for colorectal cancer, you may need to be screened more often.  Skin Cancer  Check your skin from head to toe regularly.  Monitor any moles. Be sure to tell your health care provider: ? About any new moles or changes in moles, especially if there is a change in a mole's shape or color. ? If you have a mole that is larger than the size of a pencil eraser.  If any of your family members has a history of skin cancer, especially at a young age, talk with your health care provider about genetic screening.  Always use sunscreen. Apply sunscreen liberally and repeatedly throughout the day.  Whenever you are outside, protect yourself by wearing long sleeves, pants, a wide-brimmed hat, and sunglasses.  What should I know about osteoporosis? Osteoporosis is a condition in which bone destruction happens more quickly than new bone creation. After menopause, you may be at an increased risk for osteoporosis. To help prevent osteoporosis or the bone fractures that can happen because of osteoporosis, the following is recommended:  If you are 46-71 years old, get at least 1,000 mg of calcium and at least 600 mg of vitamin D per day.  If you are older than age 55 but younger than age 65, get at least 1,200 mg of calcium and at least 600 mg of vitamin D per day.  If you are older than age 54, get at least 1,200 mg of calcium and at least 800 mg of vitamin D per day.  Smoking and excessive alcohol intake increase the risk of osteoporosis. Eat foods that are rich in calcium and vitamin D, and do weight-bearing exercises several times each week as directed by your health care provider. What should I know about how menopause affects my mental health? Depression may occur at any age, but it is more common as you become older. Common symptoms of depression  include:  Low or sad mood.  Changes in sleep patterns.  Changes in appetite or eating patterns.  Feeling an overall lack of motivation or enjoyment of activities that you previously enjoyed.  Frequent crying spells.  Talk with your health care provider if you think that you are experiencing depression. What should I know about immunizations? It is important that you get and maintain your immunizations. These include:  Tetanus, diphtheria, and pertussis (Tdap) booster vaccine.  Influenza every year before the flu season begins.  Pneumonia vaccine.  Shingles vaccine.  Your health care provider may also recommend other immunizations. This information is not intended to replace advice given to you by your health care provider. Make sure you discuss any questions you have with your health care provider. Document Released: 08/20/2005  Document Revised: 01/16/2016 Document Reviewed: 04/01/2015 Elsevier Interactive Patient Education  2018 [employer].       Menopause and Hormone Replacement Therapy What is hormone replacement therapy? Hormone replacement therapy (HRT) is the use of artificial (synthetic) hormones to replace hormones that your body stops producing during menopause. Menopause is the normal time of life when menstrual periods stop completely and the ovaries stop producing the female hormones estrogen and progesterone. This lack of hormones can affect your health and cause undesirable symptoms. HRT can relieve some of those symptoms. What are my options for HRT? HRT may consist of the synthetic hormones estrogen and progestin, or it may consist of only estrogen (estrogen-only therapy). You and your health care provider will decide which form of HRT is best for you. If you choose to be on HRT and you have a uterus, estrogen and progestin are usually prescribed. Estrogen-only therapy is used for women who do not have a uterus. Possible options for taking HRT  include:  Pills.  Patches.  Gels.  Sprays.  Vaginal cream.  Vaginal rings.  Vaginal inserts.  The amount of hormone(s) that you take and how long you take the hormone(s) varies depending on your individual health. It is important to:  Begin HRT with the lowest possible dosage.  Stop HRT as soon as your health care provider tells you to stop.  Work with your health care provider so that you feel informed and comfortable with your decisions.  What are the benefits of HRT? HRT can reduce the frequency and severity of menopausal symptoms. Benefits of HRT vary depending on the menopausal symptoms that you have, the severity of your symptoms, and your overall health. HRT may help to improve the following menopausal symptoms:  Hot flashes and night sweats. These are sudden feelings of heat that spread over the face and body. The skin may turn red, like a blush. Night sweats are hot flashes that happen while you are sleeping or trying to sleep.  Bone loss (osteoporosis). The body loses calcium more quickly after menopause, causing the bones to become weaker. This can increase the risk for bone breaks (fractures).  Vaginal dryness. The lining of the vagina can become thin and dry, which can cause pain during sexual intercourse or cause infection, burning, or itching.  Urinary tract infections.  Urinary incontinence. This is a decreased ability to control when you urinate.  Irritability.  Short-term memory problems.  What are the risks of HRT? Risks of HRT vary depending on your individual health and medical history. Risks of HRT also depend on whether you receive both estrogen and progestin or you receive estrogen only.HRT may increase the risk of:  Spotting. This is when a small amount of bloodleaks from the vagina unexpectedly.  Endometrial cancer. This cancer is in the lining of the uterus (endometrium).  Breast cancer.  Increased density of breast tissue. This can make  it harder to find breast cancer on a breast X-ray (mammogram).  Stroke.  Heart attack.  Blood clots.  Gallbladder disease.  Risks of HRT can increase if you have any of the following conditions:  Endometrial cancer.  Liver disease.  Heart disease.  Breast cancer.  History of blood clots.  History of stroke.  How should I care for myself while I am on HRT?  Take over-the-counter and prescription medicines only as told by your health care provider.  Get mammograms, pelvic exams, and medical checkups as often as told by your health care provider.  Have Pap tests done as often as told by your health care provider. A Pap test is sometimes called a Pap smear. It is a screening test that is used to check for signs of cancer of the cervix and vagina. A Pap test can also identify the presence of infection or precancerous changes. Pap tests may be done: ? Every 3 years, starting at age 33. ? Every 5 years, starting after age 105, in combination with testing for human papillomavirus (HPV). ? More often or less often depending on other medical conditions you have, your age, and other risk factors.  It is your responsibility to get your Pap test results. Ask your health care provider or the department performing the test when your results will be ready.  Keep all follow-up visits as told by your health care provider. This is important. When should I seek medical care? Talk with your health care provider if:  You have any of these: ? Pain or swelling in your legs. ? Shortness of breath. ? Chest pain. ? Lumps or changes in your breasts or armpits. ? Slurred speech. ? Pain, burning, or bleeding when you urine.  You develop any of these: ? Unusual vaginal bleeding. ? Dizziness or headaches. ? Weakness or numbness in any part of your arms or legs. ? Pain in your abdomen.  This information is not intended to replace advice given to you by your health care provider. Make sure you  discuss any questions you have with your health care provider. Document Released: 03/27/2003 Document Revised: 05/25/2016 Document Reviewed: 12/30/2014 Elsevier Interactive Patient Education  2017 [employer].

## 2018-04-01 LAB — HIV ANTIBODY (ROUTINE TESTING W REFLEX): HIV Screen 4th Generation wRfx: NONREACTIVE

## 2018-04-01 LAB — HEPATITIS C ANTIBODY: Hep C Virus Ab: <0.1 s/co ratio (ref 0.0–0.9)

## 2018-04-01 LAB — RPR: RPR Ser Ql: NONREACTIVE

## 2018-04-01 LAB — HEPATITIS B SURFACE ANTIGEN: Hepatitis B Surface Ag: NEGATIVE

## 2018-04-13 DIAGNOSIS — M7052 Other bursitis of knee, left knee: Secondary | ICD-10-CM | POA: Diagnosis not present

## 2018-04-13 DIAGNOSIS — M25562 Pain in left knee: Secondary | ICD-10-CM | POA: Diagnosis not present

## 2018-04-17 ENCOUNTER — Other Ambulatory Visit (INDEPENDENT_AMBULATORY_CARE_PROVIDER_SITE_OTHER): Payer: Self-pay | Admitting: Internal Medicine

## 2018-04-17 DIAGNOSIS — K219 Gastro-esophageal reflux disease without esophagitis: Secondary | ICD-10-CM

## 2018-04-19 ENCOUNTER — Other Ambulatory Visit: Payer: Self-pay | Admitting: Physician Assistant

## 2018-04-19 DIAGNOSIS — M2392 Unspecified internal derangement of left knee: Secondary | ICD-10-CM

## 2018-05-09 ENCOUNTER — Ambulatory Visit
Admission: RE | Admit: 2018-05-09 | Discharge: 2018-05-09 | Disposition: A | Discharge status: Home / Self Care | Payer: 59 | Source: Ambulatory Visit | Attending: Physician Assistant | Admitting: Physician Assistant

## 2018-05-09 DIAGNOSIS — M25562 Pain in left knee: Secondary | ICD-10-CM | POA: Diagnosis not present

## 2018-05-09 DIAGNOSIS — S83222A Peripheral tear of medial meniscus, current injury, left knee, initial encounter: Secondary | ICD-10-CM | POA: Diagnosis not present

## 2018-05-09 DIAGNOSIS — M2392 Unspecified internal derangement of left knee: Secondary | ICD-10-CM | POA: Diagnosis present

## 2018-05-09 DIAGNOSIS — X58XXXA Exposure to other specified factors, initial encounter: Secondary | ICD-10-CM | POA: Insufficient documentation

## 2018-05-12 ENCOUNTER — Encounter: Payer: 59 | Admitting: Obstetrics and Gynecology

## 2018-05-16 DIAGNOSIS — M2392 Unspecified internal derangement of left knee: Secondary | ICD-10-CM | POA: Diagnosis not present

## 2018-05-30 ENCOUNTER — Other Ambulatory Visit: Payer: Self-pay

## 2018-05-30 ENCOUNTER — Encounter
Admission: RE | Admit: 2018-05-30 | Discharge: 2018-05-30 | Disposition: A | Discharge status: Home / Self Care | Payer: 59 | Source: Ambulatory Visit | Attending: Orthopedic Surgery | Admitting: Orthopedic Surgery

## 2018-05-30 DIAGNOSIS — M2242 Chondromalacia patellae, left knee: Secondary | ICD-10-CM | POA: Diagnosis not present

## 2018-05-30 DIAGNOSIS — Z01818 Encounter for other preprocedural examination: Secondary | ICD-10-CM

## 2018-05-30 DIAGNOSIS — X58XXXA Exposure to other specified factors, initial encounter: Secondary | ICD-10-CM | POA: Diagnosis not present

## 2018-05-30 DIAGNOSIS — S83242A Other tear of medial meniscus, current injury, left knee, initial encounter: Secondary | ICD-10-CM | POA: Diagnosis not present

## 2018-05-30 DIAGNOSIS — Z7951 Long term (current) use of inhaled steroids: Secondary | ICD-10-CM | POA: Diagnosis not present

## 2018-05-30 DIAGNOSIS — M2392 Unspecified internal derangement of left knee: Secondary | ICD-10-CM | POA: Diagnosis present

## 2018-05-30 DIAGNOSIS — Z6833 Body mass index (BMI) 33.0-33.9, adult: Secondary | ICD-10-CM | POA: Diagnosis not present

## 2018-05-30 DIAGNOSIS — Z79899 Other long term (current) drug therapy: Secondary | ICD-10-CM | POA: Diagnosis not present

## 2018-05-30 DIAGNOSIS — G4733 Obstructive sleep apnea (adult) (pediatric): Secondary | ICD-10-CM | POA: Diagnosis not present

## 2018-05-30 DIAGNOSIS — E669 Obesity, unspecified: Secondary | ICD-10-CM | POA: Diagnosis not present

## 2018-05-30 NOTE — Patient Instructions (Signed)
Your procedure is scheduled on: wed. 11/27 Report to Day Surgery. To find out your arrival time please call [phone] between 1PM - 3PM on Tues 11/26.  Remember: Instructions that are not followed completely may result in serious medical risk,  up to and including death, or upon the discretion of your surgeon and anesthesiologist your  surgery may need to be rescheduled.     _X__ 1. Do not eat food after midnight the night before your procedure.                 No gum chewing or hard candies. You may drink clear liquids up to 2 hours                 before you are scheduled to arrive for your surgery- DO not drink clear                 liquids within 2 hours of the start of your surgery.                 Clear Liquids include:  water, apple juice without pulp, clear carbohydrate                 drink such as Clearfast of Gatorade, Black Coffee or Tea (Do not add                 anything to coffee or tea).  __X__2.  On the morning of surgery brush your teeth with toothpaste and water, you                may rinse your mouth with mouthwash if you wish.  Do not swallow any toothpaste of mouthwash.     _X__ 3.  No Alcohol for 24 hours before or after surgery.   ___ 4.  Do Not Smoke or use e-cigarettes For 24 Hours Prior to Your Surgery.                 Do not use any chewable tobacco products for at least 6 hours prior to                 surgery.  ____  5.  Bring all medications with you on the day of surgery if instructed.   __x__  6.  Notify your doctor if there is any change in your medical condition      (cold, fever, infections).     Do not wear jewelry, make-up, hairpins, clips or nail polish. Do not wear lotions, powders, or perfumes. You may wear deodorant. Do not shave 48 hours prior to surgery. Men may shave face and neck. Do not bring valuables to the hospital.    [facility] not [facility] for any belongings or valuables.  Contacts, dentures  or bridgework may not be worn into surgery. Leave your suitcase in the car. After surgery it may be brought to your room. For patients admitted to the hospital, discharge time is determined by your treatment team.   Patients discharged the day of surgery will not be allowed to drive home.   Please read over the following fact sheets that you were given:    __x__ Take these medicines the morning of surgery with A SIP OF WATER:    1. pantoprazole (PROTONIX) 40 MG tablet  2.   3.   4.  5.  6.  ____ Fleet Enema (as directed)   _x___ Use CHG Soap as directed  ____ Use inhalers  on the day of surgery  ____ Stop metformin 2 days prior to surgery    ____ Take 1/2 of usual insulin dose the night before surgery. No insulin the morning          of surgery.   ____ Stop Coumadin/Plavix/aspirin on   ____ Stop Anti-inflammatories on    ____ Stop supplements until after surgery.    ____ Bring C-Pap to the hospital.

## 2018-06-02 ENCOUNTER — Ambulatory Visit: Payer: 59 | Admitting: Anesthesiology

## 2018-06-02 ENCOUNTER — Ambulatory Visit
Admission: RE | Admit: 2018-06-02 | Discharge: 2018-06-02 | Disposition: A | Discharge status: Home / Self Care | Payer: 59 | Source: Ambulatory Visit | Attending: Orthopedic Surgery | Admitting: Orthopedic Surgery

## 2018-06-02 ENCOUNTER — Other Ambulatory Visit: Payer: Self-pay

## 2018-06-02 ENCOUNTER — Encounter: Payer: Self-pay | Admitting: Orthopedic Surgery

## 2018-06-02 ENCOUNTER — Encounter
Admission: RE | Disposition: A | Discharge status: Home / Self Care | Payer: Self-pay | Source: Ambulatory Visit | Attending: Orthopedic Surgery

## 2018-06-02 DIAGNOSIS — Z6833 Body mass index (BMI) 33.0-33.9, adult: Secondary | ICD-10-CM | POA: Insufficient documentation

## 2018-06-02 DIAGNOSIS — Z79899 Other long term (current) drug therapy: Secondary | ICD-10-CM | POA: Diagnosis not present

## 2018-06-02 DIAGNOSIS — M23222 Derangement of posterior horn of medial meniscus due to old tear or injury, left knee: Secondary | ICD-10-CM | POA: Diagnosis not present

## 2018-06-02 DIAGNOSIS — X58XXXA Exposure to other specified factors, initial encounter: Secondary | ICD-10-CM | POA: Insufficient documentation

## 2018-06-02 DIAGNOSIS — M2242 Chondromalacia patellae, left knee: Secondary | ICD-10-CM | POA: Insufficient documentation

## 2018-06-02 DIAGNOSIS — I10 Essential (primary) hypertension: Secondary | ICD-10-CM | POA: Diagnosis not present

## 2018-06-02 DIAGNOSIS — G4733 Obstructive sleep apnea (adult) (pediatric): Secondary | ICD-10-CM | POA: Insufficient documentation

## 2018-06-02 DIAGNOSIS — S83242A Other tear of medial meniscus, current injury, left knee, initial encounter: Secondary | ICD-10-CM | POA: Insufficient documentation

## 2018-06-02 DIAGNOSIS — M94262 Chondromalacia, left knee: Secondary | ICD-10-CM | POA: Diagnosis not present

## 2018-06-02 DIAGNOSIS — E669 Obesity, unspecified: Secondary | ICD-10-CM | POA: Diagnosis not present

## 2018-06-02 DIAGNOSIS — K219 Gastro-esophageal reflux disease without esophagitis: Secondary | ICD-10-CM | POA: Diagnosis not present

## 2018-06-02 DIAGNOSIS — M25562 Pain in left knee: Secondary | ICD-10-CM | POA: Diagnosis not present

## 2018-06-02 DIAGNOSIS — Z9889 Other specified postprocedural states: Secondary | ICD-10-CM

## 2018-06-02 DIAGNOSIS — E785 Hyperlipidemia, unspecified: Secondary | ICD-10-CM | POA: Diagnosis not present

## 2018-06-02 DIAGNOSIS — Z7951 Long term (current) use of inhaled steroids: Secondary | ICD-10-CM | POA: Diagnosis not present

## 2018-06-02 HISTORY — PX: KNEE ARTHROSCOPY: SHX127

## 2018-06-02 SURGERY — ARTHROSCOPY, KNEE
Anesthesia: General | Site: Knee | Laterality: Left

## 2018-06-02 MED ORDER — SCOPOLAMINE 1 MG/3DAYS TD PT72
1.0000 | MEDICATED_PATCH | Freq: Once | TRANSDERMAL | Status: DC
  Administered 2018-06-02: 1.5 mg via TRANSDERMAL

## 2018-06-02 MED ORDER — PROMETHAZINE HCL 25 MG/ML IJ SOLN: 12.5000 mg | INTRAMUSCULAR | Status: DC | PRN

## 2018-06-02 MED ORDER — HYDROMORPHONE HCL 1 MG/ML IJ SOLN
0.2500 mg | INTRAMUSCULAR | Status: AC | PRN
  Administered 2018-06-02 (×8): 0.25 mg via INTRAVENOUS

## 2018-06-02 MED ORDER — ONDANSETRON HCL 4 MG/2ML IJ SOLN: 4.0000 mg | Freq: Four times a day (QID) | INTRAMUSCULAR | Status: DC | PRN

## 2018-06-02 MED ORDER — ONDANSETRON HCL 4 MG PO TABS: 4.0000 mg | ORAL_TABLET | Freq: Four times a day (QID) | ORAL | Status: DC | PRN

## 2018-06-02 MED ORDER — HYDROMORPHONE HCL 1 MG/ML IJ SOLN
INTRAMUSCULAR | Status: AC
  Administered 2018-06-02: 0.25 mg via INTRAVENOUS
  Filled 2018-06-02: qty 1

## 2018-06-02 MED ORDER — LACTATED RINGERS IV SOLN
INTRAVENOUS | Status: DC
  Administered 2018-06-02: 14:00:00 via INTRAVENOUS

## 2018-06-02 MED ORDER — METOCLOPRAMIDE HCL 5 MG/ML IJ SOLN
INTRAMUSCULAR | Status: AC
  Administered 2018-06-02: 10 mg via INTRAVENOUS
  Filled 2018-06-02: qty 2

## 2018-06-02 MED ORDER — GLYCOPYRROLATE 0.2 MG/ML IJ SOLN
INTRAMUSCULAR | Status: DC | PRN
  Administered 2018-06-02: 0.2 mg via INTRAVENOUS

## 2018-06-02 MED ORDER — ONDANSETRON HCL 4 MG/2ML IJ SOLN
INTRAMUSCULAR | Status: DC | PRN
  Administered 2018-06-02: 4 mg via INTRAVENOUS

## 2018-06-02 MED ORDER — SODIUM CHLORIDE 0.9 % IV SOLN: INTRAVENOUS | Status: DC

## 2018-06-02 MED ORDER — DEXAMETHASONE SODIUM PHOSPHATE 10 MG/ML IJ SOLN
INTRAMUSCULAR | Status: AC
  Filled 2018-06-02: qty 1

## 2018-06-02 MED ORDER — MORPHINE SULFATE (PF) 4 MG/ML IV SOLN
INTRAVENOUS | Status: AC
  Filled 2018-06-02: qty 1

## 2018-06-02 MED ORDER — METOCLOPRAMIDE HCL 10 MG PO TABS: 5.0000 mg | ORAL_TABLET | Freq: Three times a day (TID) | ORAL | Status: DC | PRN

## 2018-06-02 MED ORDER — DEXAMETHASONE SODIUM PHOSPHATE 10 MG/ML IJ SOLN
INTRAMUSCULAR | Status: DC | PRN
  Administered 2018-06-02: 5 mg via INTRAVENOUS

## 2018-06-02 MED ORDER — HYDROMORPHONE HCL 1 MG/ML IJ SOLN
INTRAMUSCULAR | Status: AC
  Filled 2018-06-02: qty 1

## 2018-06-02 MED ORDER — FENTANYL CITRATE (PF) 100 MCG/2ML IJ SOLN
INTRAMUSCULAR | Status: AC
  Filled 2018-06-02: qty 2

## 2018-06-02 MED ORDER — SUGAMMADEX SODIUM 200 MG/2ML IV SOLN
INTRAVENOUS | Status: DC | PRN
  Administered 2018-06-02: 200 mg via INTRAVENOUS

## 2018-06-02 MED ORDER — METOCLOPRAMIDE HCL 5 MG/ML IJ SOLN
5.0000 mg | Freq: Three times a day (TID) | INTRAMUSCULAR | Status: DC | PRN
  Administered 2018-06-02: 10 mg via INTRAVENOUS

## 2018-06-02 MED ORDER — MORPHINE SULFATE 4 MG/ML IJ SOLN
INTRAMUSCULAR | Status: DC | PRN
  Administered 2018-06-02: 4 mg

## 2018-06-02 MED ORDER — BUPIVACAINE-EPINEPHRINE (PF) 0.25% -1:200000 IJ SOLN
INTRAMUSCULAR | Status: AC
  Filled 2018-06-02: qty 30

## 2018-06-02 MED ORDER — HYDROCODONE-ACETAMINOPHEN 5-325 MG PO TABS: 1.0000 | ORAL_TABLET | ORAL | Status: DC | PRN

## 2018-06-02 MED ORDER — ONDANSETRON HCL 4 MG/2ML IJ SOLN
INTRAMUSCULAR | Status: AC
  Filled 2018-06-02: qty 2

## 2018-06-02 MED ORDER — SCOPOLAMINE 1 MG/3DAYS TD PT72
MEDICATED_PATCH | TRANSDERMAL | Status: AC
  Filled 2018-06-02: qty 1

## 2018-06-02 MED ORDER — MIDAZOLAM HCL 2 MG/2ML IJ SOLN
INTRAMUSCULAR | Status: DC | PRN
  Administered 2018-06-02: 2 mg via INTRAVENOUS

## 2018-06-02 MED ORDER — ROCURONIUM BROMIDE 100 MG/10ML IV SOLN
INTRAVENOUS | Status: DC | PRN
  Administered 2018-06-02: 25 mg via INTRAVENOUS

## 2018-06-02 MED ORDER — CHLORHEXIDINE GLUCONATE 4 % EX LIQD: 60.0000 mL | Freq: Once | CUTANEOUS | Status: DC

## 2018-06-02 MED ORDER — FENTANYL CITRATE (PF) 100 MCG/2ML IJ SOLN
INTRAMUSCULAR | Status: DC | PRN
  Administered 2018-06-02 (×2): 50 ug via INTRAVENOUS

## 2018-06-02 MED ORDER — SUCCINYLCHOLINE CHLORIDE 20 MG/ML IJ SOLN
INTRAMUSCULAR | Status: DC | PRN
  Administered 2018-06-02: 100 mg via INTRAVENOUS

## 2018-06-02 MED ORDER — SUGAMMADEX SODIUM 200 MG/2ML IV SOLN
INTRAVENOUS | Status: AC
  Filled 2018-06-02: qty 2

## 2018-06-02 MED ORDER — ACETAMINOPHEN 10 MG/ML IV SOLN
INTRAVENOUS | Status: AC
  Filled 2018-06-02: qty 100

## 2018-06-02 MED ORDER — ACETAMINOPHEN 10 MG/ML IV SOLN
INTRAVENOUS | Status: DC | PRN
  Administered 2018-06-02: 1000 mg via INTRAVENOUS

## 2018-06-02 MED ORDER — PROPOFOL 10 MG/ML IV BOLUS
INTRAVENOUS | Status: DC | PRN
  Administered 2018-06-02: 200 mg via INTRAVENOUS

## 2018-06-02 MED ORDER — HYDROCODONE-ACETAMINOPHEN 5-325 MG PO TABS: 1.0000 | ORAL_TABLET | ORAL | 0 refills | Status: DC | PRN

## 2018-06-02 MED ORDER — BUPIVACAINE-EPINEPHRINE (PF) 0.25% -1:200000 IJ SOLN
INTRAMUSCULAR | Status: DC | PRN
  Administered 2018-06-02: 5 mL
  Administered 2018-06-02: 25 mL

## 2018-06-02 MED ORDER — LIDOCAINE HCL (CARDIAC) PF 100 MG/5ML IV SOSY
PREFILLED_SYRINGE | INTRAVENOUS | Status: DC | PRN
  Administered 2018-06-02: 50 mg via INTRAVENOUS

## 2018-06-02 SURGICAL SUPPLY — 25 items
BLADE SHAVER 4.5 DBL SERAT CV (CUTTER) IMPLANT
COVER WAND RF STERILE (DRAPES) IMPLANT
CUFF TOURN 24 STER (MISCELLANEOUS) IMPLANT
CUFF TOURN 30 STER DUAL PORT (MISCELLANEOUS) ×2 IMPLANT
DRSG DERMACEA 8X12 NADH (GAUZE/BANDAGES/DRESSINGS) ×2 IMPLANT
DURAPREP 26ML APPLICATOR (WOUND CARE) ×4 IMPLANT
GAUZE SPONGE 4X4 12PLY STRL (GAUZE/BANDAGES/DRESSINGS) ×2 IMPLANT
GLOVE BIOGEL M STRL SZ7.5 (GLOVE) ×2 IMPLANT
GLOVE BIOGEL PI IND STRL 7.5 (GLOVE) ×2 IMPLANT
GLOVE BIOGEL PI INDICATOR 7.5 (GLOVE) ×2
GLOVE INDICATOR 8.0 STRL GRN (GLOVE) ×2 IMPLANT
GOWN STRL REUS W/ TWL LRG LVL3 (GOWN DISPOSABLE) ×3 IMPLANT
GOWN STRL REUS W/TWL LRG LVL3 (GOWN DISPOSABLE) ×3
IV LACTATED RINGER IRRG 3000ML (IV SOLUTION) ×6
IV LR IRRIG 3000ML ARTHROMATIC (IV SOLUTION) ×6 IMPLANT
KIT TURNOVER KIT A (KITS) ×2 IMPLANT
MANIFOLD NEPTUNE II (INSTRUMENTS) ×2 IMPLANT
PACK ARTHROSCOPY KNEE (MISCELLANEOUS) ×2 IMPLANT
SET TUBE SUCT SHAVER OUTFL 24K (TUBING) ×2 IMPLANT
SET TUBE TIP INTRA-ARTICULAR (MISCELLANEOUS) ×2 IMPLANT
SUT ETHILON 3-0 FS-10 30 BLK (SUTURE) ×2
SUTURE EHLN 3-0 FS-10 30 BLK (SUTURE) ×1 IMPLANT
TUBING ARTHRO INFLOW-ONLY STRL (TUBING) ×2 IMPLANT
WAND HAND CNTRL MULTIVAC 50 (MISCELLANEOUS) ×2 IMPLANT
WRAP KNEE W/COLD PACKS 25.5X14 (SOFTGOODS) ×2 IMPLANT

## 2018-06-02 NOTE — Discharge Instructions (Signed)
AMBULATORY SURGERY  °DISCHARGE INSTRUCTIONS ° ° °1) The drugs that you were given will stay in your system until tomorrow so for the next 24 hours you should not: ° °A) Drive an automobile °B) Make any legal decisions °C) Drink any alcoholic beverage ° ° °2) You may resume regular meals tomorrow.  Today it is better to start with liquids and gradually work up to solid foods. ° °You may eat anything you prefer, but it is better to start with liquids, then soup and crackers, and gradually work up to solid foods. ° ° °3) Please notify your doctor immediately if you have any unusual bleeding, trouble breathing, redness and pain at the surgery site, drainage, fever, or pain not relieved by medication. °4)  ° °5) Your post-operative visit with Dr.                     °           °     is: Date:                        Time:   ° °Please call to schedule your post-operative visit. ° °6) Additional Instructions: ° ° ° ° ° ° ° °Instructions after Knee Arthroscopy  ° ° James P. Hooten, Jr., M.D.    ° Dept. of Orthopaedics & Sports Medicine ° [facility] ° [address] ° [place], [place]  [address] ° ° Phone: [phone]   Fax: [phone] ° ° °DIET: °• Drink plenty of non-alcoholic fluids & begin a light diet. °• Resume your normal diet the day after surgery. ° °ACTIVITY:  °• You may use crutches or a walker with weight-bearing as tolerated, unless instructed otherwise. °• You may wean yourself off of the walker or crutches as tolerated.  °• Begin doing gentle exercises. Exercising will reduce the pain and swelling, increase motion, and prevent muscle weakness.   °• Avoid strenuous activities or athletics for a minimum of 4-6 weeks after arthroscopic surgery. °• Do not drive or operate any equipment until instructed. ° °WOUND CARE:  °• Place one to two pillows under the knee the first day or two when sitting or lying.  °• Continue to use the ice packs periodically to reduce pain and swelling. °• The small incisions in  your knee are closed with nylon stitches. The stitches will be removed in the office. °• The bulky dressing may be removed on the second day after surgery. DO NOT TOUCH THE STITCHES. Put a Band-Aid over each stitch. Do NOT use any ointments or creams on the incisions.  °• You may bathe or shower after the stitches are removed at the first office visit following surgery. ° °MEDICATIONS: °• You may resume your regular medications. °• Please take the pain medication as prescribed. °• Do not take pain medication on an empty stomach. °• Do not drive or drink alcoholic beverages when taking pain medications. ° °CALL THE OFFICE FOR: °• Temperature above 101 degrees °• Excessive bleeding or drainage on the dressing. °• Excessive swelling, coldness, or paleness of the toes. °• Persistent nausea and vomiting. ° °FOLLOW-UP:  °• You should have an appointment to return to the office in 7-10 days after surgery.  °  °

## 2018-06-02 NOTE — Transfer of Care (Signed)
Immediate Anesthesia Transfer of Care Note  Patient: Kristy Travis  Procedure(s) Performed: ARTHROSCOPY KNEE, MEDIAL CHONDROPLASTY, PARTIAL MEDIAL MENISCECTOMY (Left Knee)  Patient Location: PACU  Anesthesia Type:General  Level of Consciousness: awake  Airway & Oxygen Therapy: Patient Spontanous Breathing and Patient connected to face mask oxygen  Post-op Assessment: Report given to RN and Post -op Vital signs reviewed and stable  Post vital signs: Reviewed  Last Vitals:  Vitals Value Taken Time  BP 129/77 06/02/2018  4:09 PM  Temp 36.4 C 06/02/2018  4:09 PM  Pulse    Resp 20 06/02/2018  4:10 PM  SpO2 100 % 06/02/2018  4:09 PM  Vitals shown include unvalidated device data.  Last Pain:  Vitals:   06/02/18 1330  TempSrc: Tympanic  PainSc: 7          Complications: No apparent anesthesia complications

## 2018-06-02 NOTE — Anesthesia Post-op Follow-up Note (Signed)
Anesthesia QCDR form completed.        

## 2018-06-02 NOTE — Anesthesia Preprocedure Evaluation (Addendum)
Anesthesia Evaluation  Patient identified by MRN, date of birth, ID band Patient awake    Reviewed: Allergy & Precautions, H&P , NPO status , Patient's Chart, lab work & pertinent test results  History of Anesthesia Complications (+) PONV and history of anesthetic complications  Airway Mallampati: III  TM Distance: >3 FB     Dental  (+) Teeth Intact   Pulmonary neg pulmonary ROS,           Cardiovascular hypertension,      Neuro/Psych  Neuromuscular disease (carpal tunnel) negative psych ROS   GI/Hepatic Neg liver ROS, GERD  ,  Endo/Other  negative endocrine ROS  Renal/GU Renal disease     Musculoskeletal  (+) Arthritis ,   Abdominal   Peds  Hematology negative hematology ROS (+)   Anesthesia Other Findings Past Medical History: No date: Arthritis No date: Carpal tunnel syndrome No date: Chronic fatigue No date: GERD (gastroesophageal reflux disease) 04/06/2017: GERD (gastroesophageal reflux disease) No date: Hypertension     Comment:  per patient this was prior to weight loss surgery/cn               12/01/17 No date: Obesity 07/2014: Pancreatitis     Comment:  had  gall bladder removed , spent 1 week in hospital No date: PONV (postoperative nausea and vomiting)  Past Surgical History: No date: ABDOMINAL HYSTERECTOMY 09/17/2013: BARIATRIC SURGERY; N/A     Comment:  sleeve 07/2014: CHOLECYSTECTOMY April and June 2010: cyst removed from left foot and bone removed  from left foot No date: DILATION AND CURETTAGE OF UTERUS 03/03/2016: ESOPHAGOGASTRODUODENOSCOPY; N/A     Comment:  Procedure: ESOPHAGOGASTRODUODENOSCOPY (EGD);  Surgeon:               Malissa HippoNajeeb U Rehman, MD;  Location: [facility];  Service:               Endoscopy;  Laterality: N/A;  3:00 - moved to 2:00 - Ann               to notify pt 12/12/2017: KNEE ARTHROSCOPY; Right     Comment:  Procedure: ARTHROSCOPY KNEE, MEDIAL CHONDROPLASTY, LOOSE            BODY REMOVAL,PARTIAL LATERAL MENISCECTOMY;  Surgeon:               Donato HeinzHooten, James P, MD;  Location: [facility];  Service:               Orthopedics;  Laterality: Right; 2004: laparoscopy to eval dyspepsia 06/2014: TOTAL VAGINAL HYSTERECTOMY     Comment:  with BSO     Reproductive/Obstetrics negative OB ROS                            Anesthesia Physical Anesthesia Plan  ASA: III  Anesthesia Plan: General ETT and Rapid Sequence   Post-op Pain Management:    Induction:   PONV Risk Score and Plan: 4 or greater and Ondansetron, Dexamethasone, Midazolam and Scopolamine patch - Pre-op  Airway Management Planned:   Additional Equipment:   Intra-op Plan:   Post-operative Plan:   Informed Consent: I have reviewed the patients History and Physical, chart, labs and discussed the procedure including the risks, benefits and alternatives for the proposed anesthesia with the patient or authorized representative who has indicated his/her understanding and acceptance.   Dental Advisory Given  Plan Discussed with: Anesthesiologist  Anesthesia Plan Comments:  Anesthesia Quick Evaluation  

## 2018-06-02 NOTE — Anesthesia Procedure Notes (Signed)
Procedure Name: Intubation Performed by: Hadasah Brugger, CRNA Pre-anesthesia Checklist: Patient identified, Patient being monitored, Timeout performed, Emergency Drugs available and Suction available Patient Re-evaluated:Patient Re-evaluated prior to induction Oxygen Delivery Method: Circle system utilized Preoxygenation: Pre-oxygenation with 100% oxygen Induction Type: IV induction and Rapid sequence Laryngoscope Size: Miller and 2 Grade View: Grade I Tube type: Oral Tube size: 7.0 mm Number of attempts: 1 Airway Equipment and Method: Stylet Placement Confirmation: ETT inserted through vocal cords under direct vision,  positive ETCO2 and breath sounds checked- equal and bilateral Secured at: 21 cm Tube secured with: Tape Dental Injury: Teeth and Oropharynx as per pre-operative assessment        

## 2018-06-02 NOTE — Op Note (Signed)
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   ANESTHESIA: general  ESTIMATED BLOOD LOSS: Minimal  FLUIDS REPLACED: 700 mL of crystalloid  TOURNIQUET TIME: Not used  INDICATIONS FOR SURGERY: Kristy Travis is a 52 y.o. year old female who has been seen for complaints of left knee pain. MRI demonstrated findings consistent with meniscal pathology. After discussion of the risks and benefits of surgical intervention, the patient expressed understanding of the risks benefits and agree with plans for left knee arthroscopy.   PROCEDURE IN DETAIL: The patient was brought into the operating room and, after adequate general anesthesia was achieved, a tourniquet was applied to the left thigh and the leg was placed in the leg holder. All bony prominences were well padded. The patient's left knee was cleaned and prepped with alcohol and Duraprep and draped in the usual sterile fashion. A "timeout" was performed as per usual protocol. The anticipated portal sites were injected with 0.25% Marcaine with epinephrine. An anterolateral incision was made and a cannula was inserted. A small effusion was evacuated and the knee was distended with fluid using the pump. The scope was advanced down the medial gutter into the medial compartment. Under visualization with the scope, an anteromedial portal was created and a hooked probe was inserted. The medial meniscus was visualized and probed.  There was a tear of the posterior horn of the medial meniscus in the area of the root of the  meniscus.  The tear was debrided using meniscal punches and a 4.5 mm incisor shaver.  Final contouring was performed using a 50 degree ArthroCare wand.  The articular cartilage was visualized.  Grade 3 to early grade IV chondromalacia was noted involving primarily the medial femoral condyle and to a lesser degree the medial tibial plateau.  These areas were debrided and contoured using the ArthroCare wand.  The scope was then advanced into the intercondylar notch. The anterior cruciate ligament was visualized and probed and felt to be intact. The scope was removed from the lateral portal and reinserted via the anteromedial portal to better visualize the lateral compartment. The lateral meniscus was visualized and probed.  The lateral meniscus was intact without evidence of tear or instability.  The articular cartilage of the lateral compartment was visualized and noted to be in good condition.  Finally, the scope was advanced so as to visualize the patellofemoral articulation. Good patellar tracking was appreciated.  The articular surface demonstrated grade 3 changes of chondromalacia involving the facets of the patella.  These areas were debrided and contoured using the ArthroCare wand.  The knee was irrigated with copius amounts of fluid and suctioned dry. The anterolateral portal was re-approximated with #3-0 nylon. A combination of 0.25% Marcaine with epinephrine and 4 mg of Morphine were injected via the scope. The scope was removed and the anteromedial portal was re-approximated with #3-0 nylon. A sterile dressing was applied followed by application of an ice wrap.  The patient tolerated the procedure well and was transported to the PACU in stable condition.  Aleane Wesenberg P. Angie FavaHooten, Jr., M.D.

## 2018-06-02 NOTE — H&P (Signed)
The patient has been re-examined, and the chart reviewed, and there have been no interval changes to the documented history and physical.    The risks, benefits, and alternatives have been discussed at length. The patient expressed understanding of the risks benefits and agreed with plans for surgical intervention.  James P. Hooten, Jr. M.D.    

## 2018-06-04 ENCOUNTER — Encounter: Payer: Self-pay | Admitting: Orthopedic Surgery

## 2018-06-05 ENCOUNTER — Ambulatory Visit: Payer: Self-pay | Admitting: Family Medicine

## 2018-06-06 NOTE — Anesthesia Postprocedure Evaluation (Signed)
Anesthesia Post Note  Patient: [name] Janel Hachey  Procedure(s) Performed: ARTHROSCOPY KNEE, MEDIAL CHONDROPLASTY, PARTIAL MEDIAL MENISCECTOMY (Left Knee)  Patient location during evaluation: PACU Anesthesia Type: General Level of consciousness: awake and alert Pain management: pain level controlled Vital Signs Assessment: post-procedure vital signs reviewed and stable Respiratory status: spontaneous breathing, nonlabored ventilation, respiratory function stable and patient connected to nasal cannula oxygen Cardiovascular status: blood pressure returned to baseline and stable Postop Assessment: no apparent nausea or vomiting Anesthetic complications: no     Last Vitals:  Vitals:   06/02/18 1722 06/02/18 1730  BP:  (!) 147/75  Pulse: 85 71  Resp: 18 16  Temp:  (!) 36.1 C  SpO2: 100% 100%    Last Pain:  Vitals:   06/06/18 0911  TempSrc:   PainSc: (P) 4                  Jovita GammaKathryn L Fitzgerald

## 2018-08-04 DIAGNOSIS — R319 Hematuria, unspecified: Secondary | ICD-10-CM | POA: Diagnosis not present

## 2018-08-04 DIAGNOSIS — N390 Urinary tract infection, site not specified: Secondary | ICD-10-CM | POA: Diagnosis not present

## 2018-08-04 DIAGNOSIS — A499 Bacterial infection, unspecified: Secondary | ICD-10-CM | POA: Diagnosis not present

## 2018-08-21 ENCOUNTER — Ambulatory Visit: Payer: 59 | Admitting: Urology

## 2018-09-25 ENCOUNTER — Telehealth: Payer: 59 | Admitting: Physician Assistant

## 2018-09-25 ENCOUNTER — Encounter: Payer: Self-pay | Admitting: Physician Assistant

## 2018-09-25 DIAGNOSIS — N390 Urinary tract infection, site not specified: Secondary | ICD-10-CM

## 2018-09-25 MED ORDER — NITROFURANTOIN MONOHYD MACRO 100 MG PO CAPS: 100.0000 mg | ORAL_CAPSULE | Freq: Two times a day (BID) | ORAL | 0 refills | Status: DC

## 2018-09-25 NOTE — Progress Notes (Signed)
We are sorry that you are not feeling well.  Here is how we plan to help!  Based on what you shared with me it looks like you most likely have a simple urinary tract infection.  A UTI (Urinary Tract Infection) is a bacterial infection of the bladder.  Most cases of urinary tract infections are simple to treat but a key part of your care is to encourage you to drink plenty of fluids and watch your symptoms carefully.  I have prescribed MacroBid 100 mg twice a day for 5 days.  Your symptoms should gradually improve. Call us if the burning in your urine worsens, you develop worsening fever, back pain or pelvic pain or if your symptoms do not resolve after completing the antibiotic.  Kristy Travis,  The vaginal discharge is not due to the UTI and can be due to another infection. If the discharge persists, submit an evisit for the discharge or follow up with your doctor    Urinary tract infections can be prevented by drinking plenty of water to keep your body hydrated.  Also be sure when you wipe, wipe from front to back and don't hold it in!  If possible, empty your bladder every 4 hours.  Your e-visit answers were reviewed by a board certified advanced clinical practitioner to complete your personal care plan.  Depending on the condition, your plan could have included both over the counter or prescription medications.  If there is a problem please reply  once you have received a response from your provider.  Your safety is important to [place].  If you have drug allergies check your prescription carefully.    You can use MyChart to ask questions about today's visit, request a non-urgent call back, or ask for a work or school excuse for 24 hours related to this e-Visit. If it has been greater than 24 hours you will need to follow up with your provider, or enter a new e-Visit to address those concerns.   You will get an e-mail in the next two days asking about your experience.  I hope that your  e-visit has been valuable and will speed your recovery. Thank you for using e-visits.

## 2018-10-12 DIAGNOSIS — M1712 Unilateral primary osteoarthritis, left knee: Secondary | ICD-10-CM | POA: Diagnosis not present

## 2018-10-12 DIAGNOSIS — G8929 Other chronic pain: Secondary | ICD-10-CM | POA: Diagnosis not present

## 2018-10-12 DIAGNOSIS — M25562 Pain in left knee: Secondary | ICD-10-CM | POA: Diagnosis not present

## 2018-11-07 DIAGNOSIS — M1712 Unilateral primary osteoarthritis, left knee: Secondary | ICD-10-CM | POA: Diagnosis not present

## 2018-11-10 ENCOUNTER — Telehealth: Payer: Self-pay | Admitting: *Deleted

## 2018-11-10 ENCOUNTER — Telehealth: Payer: Self-pay

## 2018-11-10 DIAGNOSIS — D649 Anemia, unspecified: Secondary | ICD-10-CM

## 2018-11-10 DIAGNOSIS — E559 Vitamin D deficiency, unspecified: Secondary | ICD-10-CM

## 2018-11-10 DIAGNOSIS — R7302 Impaired glucose tolerance (oral): Secondary | ICD-10-CM

## 2018-11-10 DIAGNOSIS — E7849 Other hyperlipidemia: Secondary | ICD-10-CM

## 2018-11-10 NOTE — Telephone Encounter (Signed)
What labs would you like drawn for this patient?

## 2018-11-10 NOTE — Telephone Encounter (Signed)
Labs ordered and faxed.

## 2018-11-10 NOTE — Telephone Encounter (Signed)
Pt is scheduled for CPE 12-11-18 wanted lab orders sent to [facility].

## 2018-11-10 NOTE — Telephone Encounter (Signed)
pls order CBC, fasting lipid, cmp and EGFr, TSH, HBA1C and vit D Dx are hyperlipidemia, IGT , obesity and anemia

## 2018-11-14 DIAGNOSIS — E7849 Other hyperlipidemia: Secondary | ICD-10-CM | POA: Diagnosis not present

## 2018-11-14 DIAGNOSIS — R7302 Impaired glucose tolerance (oral): Secondary | ICD-10-CM | POA: Diagnosis not present

## 2018-11-14 DIAGNOSIS — D649 Anemia, unspecified: Secondary | ICD-10-CM | POA: Diagnosis not present

## 2018-11-14 DIAGNOSIS — E559 Vitamin D deficiency, unspecified: Secondary | ICD-10-CM | POA: Diagnosis not present

## 2018-11-14 DIAGNOSIS — M1712 Unilateral primary osteoarthritis, left knee: Secondary | ICD-10-CM | POA: Diagnosis not present

## 2018-11-15 ENCOUNTER — Encounter: Payer: Self-pay | Admitting: Family Medicine

## 2018-11-15 LAB — CMP14+EGFR
ALT: 9 IU/L (ref 0–32)
AST: 14 IU/L (ref 0–40)
Albumin/Globulin Ratio: 2 (ref 1.2–2.2)
Albumin: 4.4 g/dL (ref 3.8–4.8)
Alkaline Phosphatase: 80 IU/L (ref 39–117)
BUN/Creatinine Ratio: 21 (ref 9–23)
BUN: 14 mg/dL (ref 6–24)
Bilirubin Total: 0.2 mg/dL (ref 0.0–1.2)
CO2: 24 mmol/L (ref 20–29)
Calcium: 9.7 mg/dL (ref 8.7–10.2)
Chloride: 103 mmol/L (ref 96–106)
Creatinine, Ser: 0.67 mg/dL (ref 0.57–1.00)
GFR calc Af Amer: 119 mL/min/{1.73_m2} (ref >59)
GFR calc non Af Amer: 103 mL/min/{1.73_m2} (ref >59)
Globulin, Total: 2.2 g/dL (ref 1.5–4.5)
Glucose: 91 mg/dL (ref 65–99)
Potassium: 4.5 mmol/L (ref 3.5–5.2)
Sodium: 141 mmol/L (ref 134–144)
Total Protein: 6.6 g/dL (ref 6.0–8.5)

## 2018-11-15 LAB — CBC
Hematocrit: 37.1 % (ref 34.0–46.6)
Hemoglobin: 11.7 g/dL (ref 11.1–15.9)
MCH: 26.5 pg — ABNORMAL LOW (ref 26.6–33.0)
MCHC: 31.5 g/dL (ref 31.5–35.7)
MCV: 84 fL (ref 79–97)
Platelets: 259 10*3/uL (ref 150–450)
RBC: 4.41 x10E6/uL (ref 3.77–5.28)
RDW: 13.6 % (ref 11.7–15.4)
WBC: 5.3 10*3/uL (ref 3.4–10.8)

## 2018-11-15 LAB — HEMOGLOBIN A1C
Est. average glucose Bld gHb Est-mCnc: 117 mg/dL
Hgb A1c MFr Bld: 5.7 % — ABNORMAL HIGH (ref 4.8–5.6)

## 2018-11-15 LAB — LIPID PANEL
Chol/HDL Ratio: 3.6 ratio (ref 0.0–4.4)
Cholesterol, Total: 260 mg/dL — ABNORMAL HIGH (ref 100–199)
HDL: 73 mg/dL (ref >39)
LDL Calculated: 167 mg/dL — ABNORMAL HIGH (ref 0–99)
Triglycerides: 101 mg/dL (ref 0–149)
VLDL Cholesterol Cal: 20 mg/dL (ref 5–40)

## 2018-11-15 LAB — VITAMIN D 25 HYDROXY (VIT D DEFICIENCY, FRACTURES): Vit D, 25-Hydroxy: 35.9 ng/mL (ref 30.0–100.0)

## 2018-11-15 LAB — TSH: TSH: 2.42 u[IU]/mL (ref 0.450–4.500)

## 2018-11-16 ENCOUNTER — Encounter: Payer: Self-pay | Admitting: Family Medicine

## 2018-11-21 DIAGNOSIS — M1712 Unilateral primary osteoarthritis, left knee: Secondary | ICD-10-CM | POA: Diagnosis not present

## 2018-12-11 ENCOUNTER — Other Ambulatory Visit: Payer: Self-pay

## 2018-12-11 ENCOUNTER — Other Ambulatory Visit (HOSPITAL_COMMUNITY)
Admission: RE | Admit: 2018-12-11 | Discharge: 2018-12-11 | Disposition: A | Discharge status: Home / Self Care | Payer: 59 | Source: Ambulatory Visit | Attending: Family Medicine | Admitting: Family Medicine

## 2018-12-11 ENCOUNTER — Encounter: Payer: Self-pay | Admitting: Family Medicine

## 2018-12-11 ENCOUNTER — Other Ambulatory Visit (HOSPITAL_COMMUNITY)
Admission: RE | Admit: 2018-12-11 | Discharge: 2018-12-11 | Disposition: A | Discharge status: Home / Self Care | Payer: 59 | Source: Ambulatory Visit | Attending: *Deleted | Admitting: *Deleted

## 2018-12-11 ENCOUNTER — Ambulatory Visit (INDEPENDENT_AMBULATORY_CARE_PROVIDER_SITE_OTHER): Payer: 59 | Admitting: Family Medicine

## 2018-12-11 VITALS — BP 148/98 | HR 72 | Temp 97.9°F | Resp 16 | Ht 65.0 in | Wt 205.0 lb

## 2018-12-11 DIAGNOSIS — Z0000 Encounter for general adult medical examination without abnormal findings: Secondary | ICD-10-CM | POA: Diagnosis not present

## 2018-12-11 DIAGNOSIS — E6609 Other obesity due to excess calories: Secondary | ICD-10-CM | POA: Diagnosis not present

## 2018-12-11 DIAGNOSIS — Z23 Encounter for immunization: Secondary | ICD-10-CM

## 2018-12-11 DIAGNOSIS — I10 Essential (primary) hypertension: Secondary | ICD-10-CM

## 2018-12-11 DIAGNOSIS — N3000 Acute cystitis without hematuria: Secondary | ICD-10-CM

## 2018-12-11 HISTORY — DX: Encounter for immunization: Z23

## 2018-12-11 LAB — POCT URINALYSIS DIP (CLINITEK)
Bilirubin, UA: NEGATIVE
Blood, UA: NEGATIVE
Glucose, UA: NEGATIVE mg/dL
Ketones, POC UA: NEGATIVE mg/dL
Nitrite, UA: NEGATIVE
POC PROTEIN,UA: NEGATIVE
Spec Grav, UA: 1.02 (ref 1.010–1.025)
Urobilinogen, UA: 0.2 E.U./dL
pH, UA: 6 (ref 5.0–8.0)

## 2018-12-11 MED ORDER — ERGOCALCIFEROL 1.25 MG (50000 UT) PO CAPS: 50000.0000 [IU] | ORAL_CAPSULE | ORAL | 3 refills | Status: DC

## 2018-12-11 MED ORDER — VENLAFAXINE HCL ER 37.5 MG PO CP24: 37.5000 mg | ORAL_CAPSULE | Freq: Every day | ORAL | 1 refills | Status: DC

## 2018-12-11 MED ORDER — SPIRONOLACTONE 25 MG PO TABS: 25.0000 mg | ORAL_TABLET | Freq: Every day | ORAL | 3 refills | Status: DC

## 2018-12-11 NOTE — Assessment & Plan Note (Signed)
After obtaining informed consent, the vaccine is  administered , with no adverse effect noted at the time of administration.  

## 2018-12-11 NOTE — Patient Instructions (Signed)
F/U in 2 months, call if you need me sooner. Shingrix # 2 at visit  1500 calorie diet sheet provided Medication will be prescribed for weight loss, but you do need to change food choice also You are started on spironolactone 25 mg daily for your blood pressure.  Need to reduce fat in diet, including oil, butter , cheese  Shingrix # 1 administered today   Urine looks as if you MAY have an infection , when result comes back by mid to end week you will get a message,no antibiotic is prescribed today  Venalefexine is p[rescribed to help with night sweats  It is important that you exercise regularly at least 30 minutes 5 times a week. If you develop chest pain, have severe difficulty breathing, or feel very tired, stop exercising immediately and seek medical attention    Thanks for choosing [facility], we consider it a privelige to serve you.

## 2018-12-11 NOTE — Assessment & Plan Note (Signed)

## 2018-12-12 ENCOUNTER — Encounter: Payer: Self-pay | Admitting: Family Medicine

## 2018-12-12 LAB — URINE CULTURE
Culture: NO GROWTH
Special Requests: NORMAL

## 2018-12-14 ENCOUNTER — Encounter: Payer: Self-pay | Admitting: Family Medicine

## 2018-12-14 LAB — CYTOLOGY - PAP
Diagnosis: NEGATIVE
HPV: NOT DETECTED

## 2018-12-15 ENCOUNTER — Encounter: Payer: Self-pay | Admitting: Family Medicine

## 2018-12-15 DIAGNOSIS — N3000 Acute cystitis without hematuria: Secondary | ICD-10-CM | POA: Insufficient documentation

## 2018-12-15 MED ORDER — LIRAGLUTIDE -WEIGHT MANAGEMENT 18 MG/3ML ~~LOC~~ SOPN: 0.6000 mg | PEN_INJECTOR | Freq: Every day | SUBCUTANEOUS | 2 refills | Status: DC

## 2018-12-15 NOTE — Assessment & Plan Note (Signed)
Deteriorated.  Patient re-educated about  the importance of commitment to a  minimum of 150 minutes of exercise per week as able.  The importance of healthy food choices with portion control discussed, as well as eating regularly and within a 12 hour window most days. The need to choose "clean , green" food 50 to 75% of the time is discussed, as well as to make water the primary drink and set a goal of 64 ounces water daily.  Encouraged to start a food diary,  and to consider  joining a support group. Sample diet sheets offered. Goals set by the patient for the next several months.   Weight /BMI 12/11/2018 06/02/2018 05/30/2018  WEIGHT 205 lb 204 lb 204 lb  HEIGHT 5\' 5"  5\' 5"  5\' 5"   BMI 34.11 kg/m2 33.95 kg/m2 33.95 kg/m2   Start saxenda with close follow up, 1500 cal diet sheet provided

## 2018-12-15 NOTE — Assessment & Plan Note (Signed)
Uncontrolled, needs to start medication Spironolactone started DASH diet and commitment to daily physical activity for a minimum of 30 minutes discussed and encouraged, as a part of hypertension management. The importance of attaining a healthy weight is also discussed.  BP/Weight 12/11/2018 06/02/2018 05/30/2018 03/31/2018 01/16/2018 12/12/2017 12/01/2017  Systolic BP 148 147 117 126 120 131 151  Diastolic BP 98 75 68 80 78 72 77  Wt. (Lbs) 205 204 204 204.5 211 207.2 205  BMI 34.11 33.95 33.95 34.03 35.11 34.48 34.11

## 2018-12-15 NOTE — Assessment & Plan Note (Signed)
Urine tested for infection, negative

## 2018-12-15 NOTE — Progress Notes (Signed)
    Kristy Travis     MRN: [id number]      DOB: 03-13-1966  HPI: Patient is in for annual physical exam. C/o weight gain and wants help with this, unable to exercise as she should because of significant knee pain and debility Blood pressure again elevated , so she  is resuming medication 2 month h/o urinary frequency and noted blood in the urine also, no fever, chills or flank pain. Recent labs,  are reviewed.She is hyperlipidemic with elevated blood sugar Immunization is reviewed , and  shingrix # 1 administered PE: BP (!) 148/98   Pulse 72   Temp 97.9 F (36.6 C) (Temporal)   Resp 16   Ht 5\' 5"  (1.651 m)   Wt 205 lb (93 kg)   LMP 06/24/2014   SpO2 98%   BMI 34.11 kg/m   Pleasant  female, alert and oriented x 3, in no cardio-pulmonary distress. Afebrile. HEENT No facial trauma or asymetry. Sinuses non tender.  Extra occullar muscles intact.External ears normal, tympanic membranes clear. Oropharynx moist, no exudate. Neck: supple, no adenopathy,JVD or thyromegaly.No bruits.  Chest: Clear to ascultation bilaterally.No crackles or wheezes. Non tender to palpation  Breast: No asymetry,no masses or lumps. No tenderness. No nipple discharge or inversion. No axillary or supraclavicular adenopathy  Cardiovascular system; Heart sounds normal,  S1 and  S2 ,no S3.  No murmur, or thrill. Apical beat not displaced Peripheral pulses normal.  Abdomen: Soft, non tender, no organomegaly or masses. No bruits. Bowel sounds normal. No guarding, tenderness or rebound.  GU: External genitalia normal female genitalia , normal female distribution of hair. No lesions. Urethral meatus normal in size, no  Prolapse, no lesions visibly  Present. Bladder non tender. Vagina pink and moist , with no visible lesions , discharge present . Adequate pelvic support no  cystocele or rectocele noted  Uterus absent, no adnexal masses, no  adnexal tenderness.   Musculoskeletal exam:  Full ROM of spine, hips , shoulders and reduced in both knees. deformity ,swelling and  crepitus noted in the knees No muscle wasting or atrophy.   Neurologic: Cranial nerves 2 to 12 intact. Power, tone ,sensation and reflexes normal throughout. No disturbance in gait. No tremor.  Skin: Intact, no ulceration, erythema , scaling or rash noted. Pigmentation normal throughout  Psych; Normal mood and affect. Judgement and concentration normal   Assessment & Plan:

## 2018-12-18 ENCOUNTER — Other Ambulatory Visit: Payer: Self-pay

## 2018-12-19 ENCOUNTER — Telehealth: Payer: Self-pay | Admitting: *Deleted

## 2018-12-19 DIAGNOSIS — M1712 Unilateral primary osteoarthritis, left knee: Secondary | ICD-10-CM | POA: Diagnosis not present

## 2018-12-19 NOTE — Telephone Encounter (Signed)
Spoke with patient and let her know that medication has been precerted and we are waiting on an answer from the insurance company.

## 2018-12-19 NOTE — Telephone Encounter (Signed)
Pt called stated that her saxenda (weight mngment pill) was called in. The pharmacy told her it was sent back to the office as it needed prior authorization. She was trying to see what she needed to do to get this filled.

## 2018-12-27 ENCOUNTER — Telehealth: Payer: Self-pay | Admitting: *Deleted

## 2018-12-27 ENCOUNTER — Other Ambulatory Visit: Payer: Self-pay | Admitting: Family Medicine

## 2018-12-27 ENCOUNTER — Other Ambulatory Visit: Payer: Self-pay

## 2018-12-27 ENCOUNTER — Encounter: Payer: Self-pay | Admitting: Family Medicine

## 2018-12-27 MED ORDER — SAXENDA 18 MG/3ML ~~LOC~~ SOPN: 0.6000 mg | PEN_INJECTOR | Freq: Every day | SUBCUTANEOUS | 2 refills | Status: DC

## 2018-12-27 MED ORDER — NALTREXONE-BUPROPION HCL ER 8-90 MG PO TB12: ORAL_TABLET | ORAL | 0 refills | Status: DC

## 2018-12-27 NOTE — Telephone Encounter (Signed)
Pt called said the weight loss pill that Dr. Moshe Cipro put her on was not covered by her insurance. Wanted to know if [facility] could [facility] [address] in [place] as she was going to see if her husbands insurance would cover it. She could not afford it.

## 2018-12-27 NOTE — Telephone Encounter (Signed)
Printed rx for dr Moshe Cipro to sign and I will fax

## 2018-12-27 NOTE — Progress Notes (Unsigned)
contrave

## 2018-12-31 IMAGING — MR MR KNEE*L* W/O CM
7 series · 40 of 40 positions shown · non-contrast
Comparison: None.

CLINICAL DATA: Left knee pain for 2 months.  No known injury.

EXAM:
MRI OF THE LEFT KNEE WITHOUT CONTRAST
TECHNIQUE: Multiplanar, multisequence MR imaging of the knee was performed. No
intravenous contrast was administered.

[Series 3: T2 fat-sat · axial · 4.0mm · 0.53mm/px · z∈[-71,+99]mm · 6 of 35 slices shown (1 of 3)]
[im 1/35]
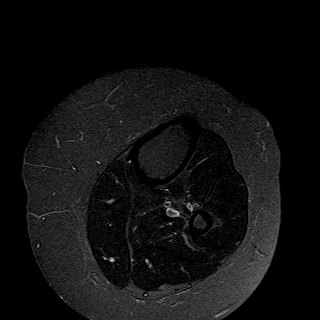
[im 7/35]
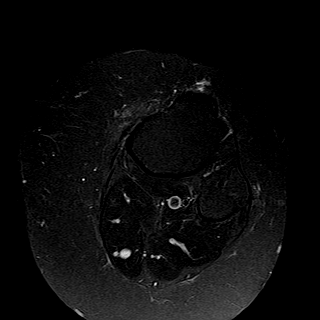
[im 14/35]
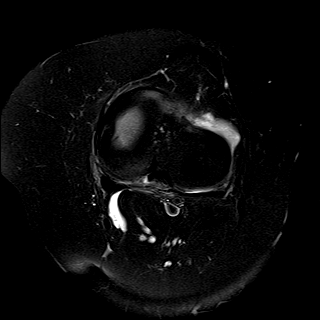
[im 21/35]
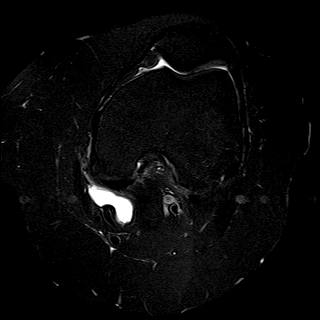
[im 28/35]
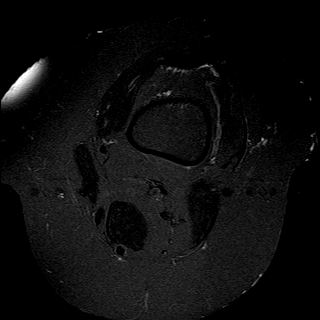
[im 35/35]
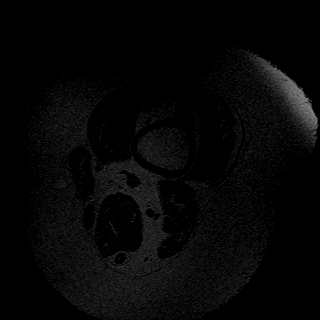

[Series 4: T1 · coronal · 4.0mm · 0.62mm/px · 6 of 31 slices shown]
[im 1/31]
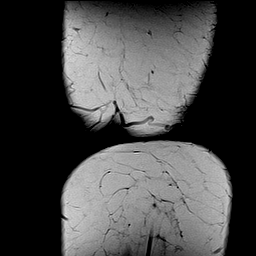
[im 7/31]
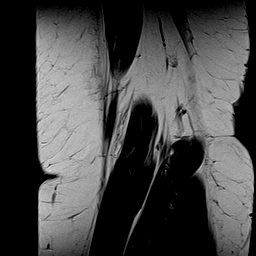
[im 13/31]
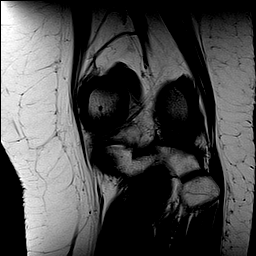
[im 19/31]
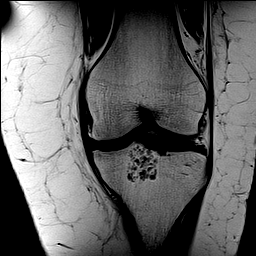
[im 25/31]
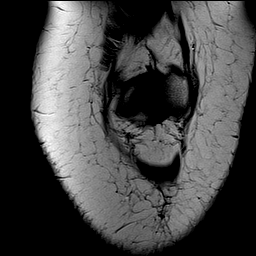
[im 31/31]
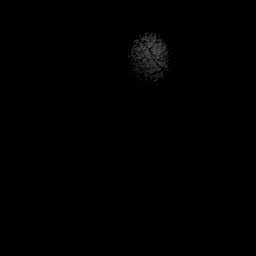

[Series 5: PD fat-sat · sagittal · 3.0mm · 0.62mm/px · 6 of 35 slices shown (1 of 3)]
[im 1/35]
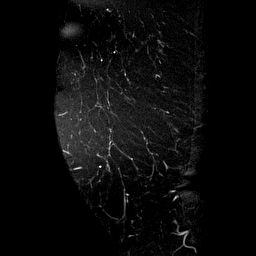
[im 7/35]
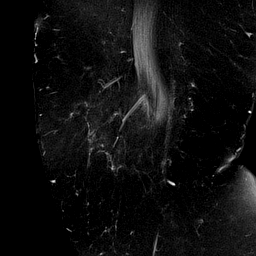
[im 14/35]
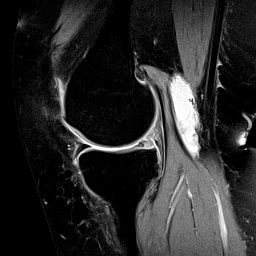
[im 21/35]
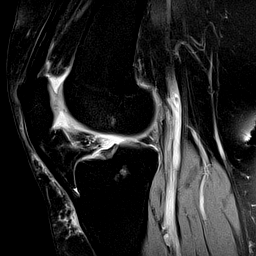
[im 28/35]
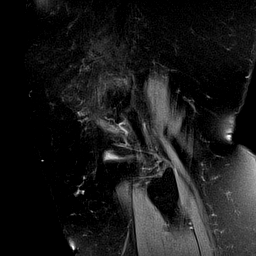
[im 35/35]
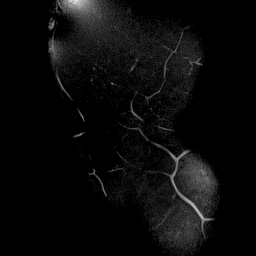

[Series 6: T2 fat-sat · coronal · 4.0mm · 0.62mm/px · 6 of 31 slices shown (2 of 3)]
[im 1/31]
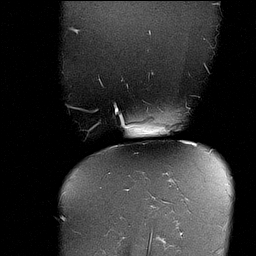
[im 7/31]
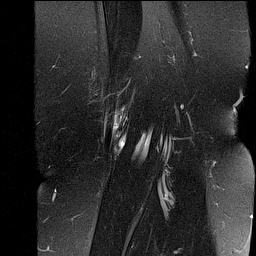
[im 13/31]
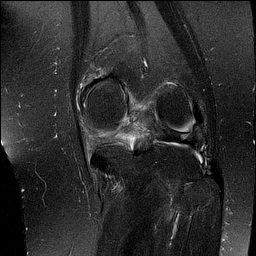
[im 19/31]
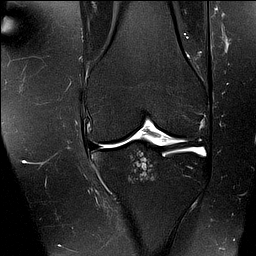
[im 25/31]
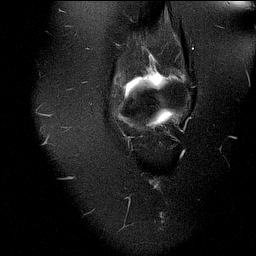
[im 31/31]
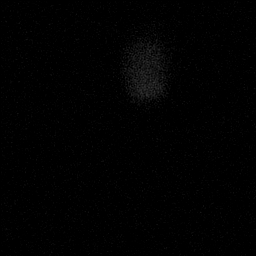

[Series 7: PD fat-sat · coronal · 4.0mm · 0.62mm/px · 6 of 31 slices shown (2 of 3)]
[im 1/31]
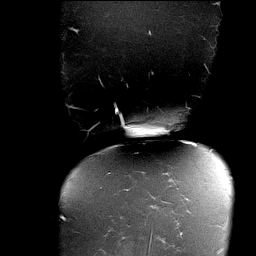
[im 7/31]
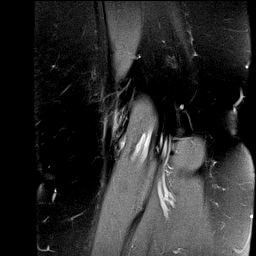
[im 13/31]
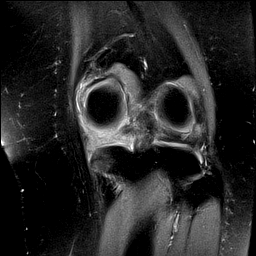
[im 19/31]
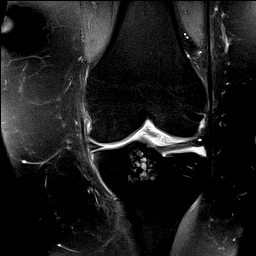
[im 25/31]
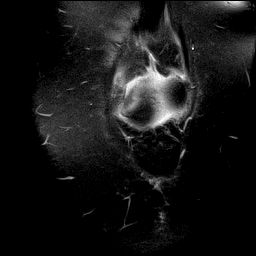
[im 31/31]
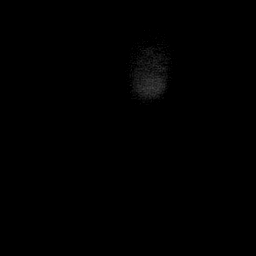

[Series 8: T2 fat-sat · sagittal · 3.0mm · 0.62mm/px · 6 of 35 slices shown (3 of 3)]
[im 1/35]
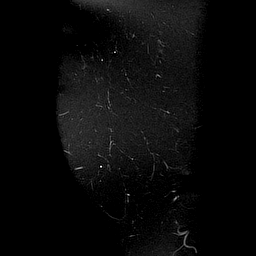
[im 7/35]
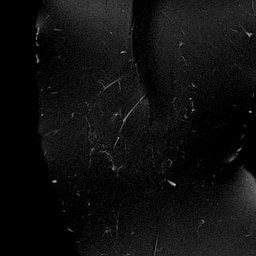
[im 14/35]
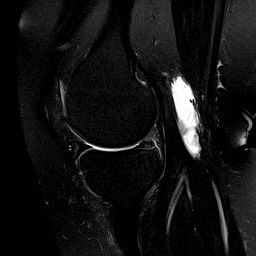
[im 21/35]
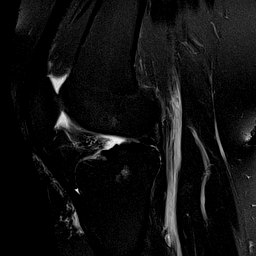
[im 28/35]
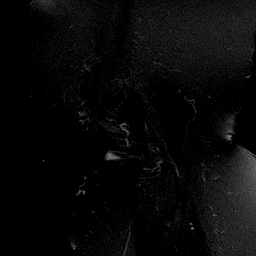
[im 35/35]
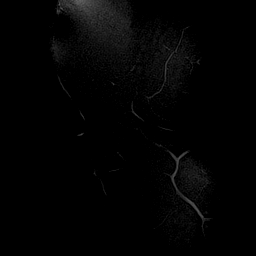

[Series 9: PD fat-sat · coronal · 2.0mm · 0.62mm/px · 4 of 19 slices shown (3 of 3)]
[im 1/19]
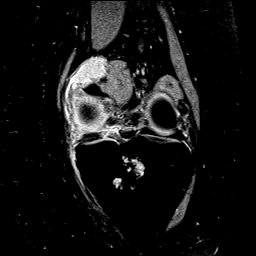
[im 7/19]
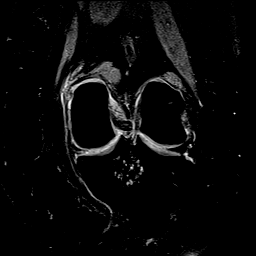
[im 13/19]
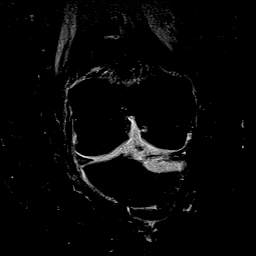
[im 19/19]
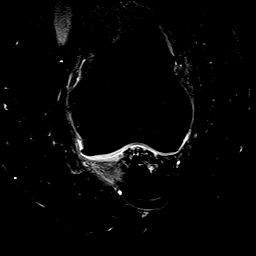

[40 of 40 positions shown; findings below may reference images not displayed]

FINDINGS: MENISCI

Medial meniscus: Radial tear of the posterior horn of the medial
meniscus adjacent to the meniscal root with peripheral meniscal
extrusion.

Lateral meniscus:  Intact.

LIGAMENTS

Cruciates:  Intact ACL.  Intact PCL.

Collaterals: Medial collateral ligament is intact. Lateral
collateral ligament complex is intact.

CARTILAGE

Patellofemoral:  No chondral defect.

Medial: Mild partial-thickness cartilage loss of the medial
femorotibial compartment.

Lateral:  No chondral defect.

Joint: No significant joint effusion. Normal Hoffa's fat. No plical
thickening.

Popliteal Fossa:  Small Baker's cyst.  Intact popliteus tendon.

Extensor Mechanism: Intact quadriceps tendon. Intact patellar
tendon. Intact medial patellar retinaculum. Intact lateral patellar
retinaculum. Intact MPFL.

Bones:  No acute osseous abnormality.  No aggressive osseous lesion.

2.4 cm macrolobulated T2 hyperintense bone lesion in the proximal
tibial metaphysis with interspersed areas of T1 hypointensity and T1
hyperintensity. No surrounding marrow edema. No bone destruction or
soft tissue mass. Overall appearance is most consistent with a
benign chondroid lesion such as an enchondroma.

Other: No fluid collection or hematoma.  Muscles are normal.
IMPRESSION: 1. Radial tear of the posterior horn of the medial meniscus adjacent
to the meniscal root with peripheral meniscal extrusion.
2. Mild partial-thickness cartilage loss of the medial femorotibial
compartment.

## 2019-01-02 ENCOUNTER — Telehealth: Payer: Self-pay | Admitting: *Deleted

## 2019-01-02 NOTE — Telephone Encounter (Signed)
Pt called about her weight loss medication. She said she [facility] [address] because his insurance may cover it. A generic medication had been [facility] but when she went to pick it up they had to send it back to the office and she is trying to figure out which way she needs to go to get his medication.

## 2019-01-02 NOTE — Telephone Encounter (Signed)
Spoke with patient and let her know that her insurance is requiring her to try Contrave first and that also requires a PA. Once we have an answer we will let her know. She verbalized understanding.

## 2019-01-23 ENCOUNTER — Telehealth: Payer: Self-pay | Admitting: Family Medicine

## 2019-01-23 NOTE — Telephone Encounter (Signed)
Pt is having a problem with the diet pills with swallowing and keeping them down, can she do injections.

## 2019-01-24 NOTE — Telephone Encounter (Signed)
Routing to [place] for Advice?

## 2019-01-31 NOTE — Telephone Encounter (Signed)
Notes faxed in for appeal for saxenda injections

## 2019-02-06 NOTE — Telephone Encounter (Signed)
Advised pt

## 2019-02-20 ENCOUNTER — Telehealth: Payer: Self-pay | Admitting: *Deleted

## 2019-02-20 ENCOUNTER — Other Ambulatory Visit: Payer: Self-pay

## 2019-02-20 DIAGNOSIS — R300 Dysuria: Secondary | ICD-10-CM | POA: Diagnosis not present

## 2019-02-20 DIAGNOSIS — N3090 Cystitis, unspecified without hematuria: Secondary | ICD-10-CM | POA: Diagnosis not present

## 2019-02-20 NOTE — Telephone Encounter (Signed)
Pt wanted to know if anyone had heard back from the insurance on her weight loss injections. She can be reached at [phone]

## 2019-02-22 NOTE — Telephone Encounter (Signed)
Pt already aware that this was denied a month ago and contrave was prescribed

## 2019-03-08 NOTE — Telephone Encounter (Signed)
Received notifcation that the appeal for [place] was denied and pt notified through [facility]

## 2019-04-05 ENCOUNTER — Ambulatory Visit: Payer: 59 | Admitting: Family Medicine

## 2019-04-11 ENCOUNTER — Telehealth: Payer: Self-pay | Admitting: Family Medicine

## 2019-04-11 NOTE — Telephone Encounter (Signed)
Pt had her FLU shot at [facility] on Mon Sept 21

## 2019-04-11 NOTE — Telephone Encounter (Signed)
Chart update with flu vaccine info 

## 2019-04-24 ENCOUNTER — Ambulatory Visit (INDEPENDENT_AMBULATORY_CARE_PROVIDER_SITE_OTHER): Payer: 59 | Admitting: Family Medicine

## 2019-04-24 ENCOUNTER — Encounter: Payer: Self-pay | Admitting: Family Medicine

## 2019-04-24 ENCOUNTER — Other Ambulatory Visit: Payer: Self-pay

## 2019-04-24 VITALS — BP 132/90 | HR 74 | Temp 98.0°F | Resp 15 | Ht 65.0 in | Wt 210.0 lb

## 2019-04-24 DIAGNOSIS — E6609 Other obesity due to excess calories: Secondary | ICD-10-CM

## 2019-04-24 DIAGNOSIS — Z23 Encounter for immunization: Secondary | ICD-10-CM

## 2019-04-24 DIAGNOSIS — K219 Gastro-esophageal reflux disease without esophagitis: Secondary | ICD-10-CM

## 2019-04-24 DIAGNOSIS — E7849 Other hyperlipidemia: Secondary | ICD-10-CM

## 2019-04-24 DIAGNOSIS — I10 Essential (primary) hypertension: Secondary | ICD-10-CM | POA: Diagnosis not present

## 2019-04-24 MED ORDER — LANSOPRAZOLE 30 MG PO CPDR: 30.0000 mg | DELAYED_RELEASE_CAPSULE | Freq: Two times a day (BID) | ORAL | 1 refills | Status: DC

## 2019-04-24 MED ORDER — CLOTRIMAZOLE-BETAMETHASONE 1-0.05 % EX CREA: TOPICAL_CREAM | CUTANEOUS | 0 refills | Status: DC

## 2019-04-24 MED ORDER — ONDANSETRON HCL 4 MG PO TABS: ORAL_TABLET | ORAL | 1 refills | Status: DC

## 2019-04-24 NOTE — Patient Instructions (Addendum)
Follow-up in 8 weeks with MD for weight recheck.  Call if you need me before. Shingrix #2 today New for uncontrolled reflux is Prevacid 1 twice daily.  New for nausea is Zofran 1 daily as needed.  New for rash on forearm is an antifungal antifungal and steroid cream to be applied sparingly for 1 week then as needed.  For weight loss it is very important to log both your caloric intake every day and your exercise.  Most important is a caloric intake.  Please limit as much as possible your intake to 1500 cal/day and no less than 1200 cal/day.  Weight loss goal of 3 to 4 pounds / month  You will be referred to Dr. Laural Golden for your colonoscopy which is past due. Thanks for choosing [facility], we consider it a privelige to serve you.  Fasting lipid, chem7 and eGFR  1 week before follow up

## 2019-04-25 ENCOUNTER — Encounter: Payer: Self-pay | Admitting: Family Medicine

## 2019-04-25 LAB — BASIC METABOLIC PANEL WITH GFR
BUN: 13 mg/dL (ref 7–25)
CO2: 30 mmol/L (ref 20–32)
Calcium: 9.9 mg/dL (ref 8.6–10.4)
Chloride: 103 mmol/L (ref 98–110)
Creat: 0.64 mg/dL (ref 0.50–1.05)
GFR, Est African American: 120 mL/min/{1.73_m2} (ref >=60)
GFR, Est Non African American: 103 mL/min/{1.73_m2} (ref >=60)
Glucose, Bld: 84 mg/dL (ref 65–99)
Potassium: 4.6 mmol/L (ref 3.5–5.3)
Sodium: 140 mmol/L (ref 135–146)

## 2019-04-25 LAB — LIPID PANEL
Cholesterol: 266 mg/dL — ABNORMAL HIGH (ref <200)
HDL: 83 mg/dL (ref >=50)
LDL Cholesterol (Calc): 162 mg/dL (calc) — ABNORMAL HIGH
Non-HDL Cholesterol (Calc): 183 mg/dL (calc) — ABNORMAL HIGH (ref <130)
Total CHOL/HDL Ratio: 3.2 (calc) (ref <5.0)
Triglycerides: 97 mg/dL (ref <150)

## 2019-04-29 ENCOUNTER — Encounter: Payer: Self-pay | Admitting: Family Medicine

## 2019-04-29 NOTE — Assessment & Plan Note (Signed)
Sub optimal control, weight loss to lower reading , no additional medication  DASH diet and commitment to daily physical activity for a minimum of 30 minutes discussed and encouraged, as a part of hypertension management. The importance of attaining a healthy weight is also discussed.  BP/Weight 04/24/2019 12/11/2018 06/02/2018 05/30/2018 03/31/2018 10/14/4096 07/12/9145  Systolic BP 829 562 130 865 784 696 295  Diastolic BP 90 98 75 68 80 78 72  Wt. (Lbs) 210 205 204 204 204.5 211 207.2  BMI 34.95 34.11 33.95 33.95 34.03 35.11 34.48

## 2019-04-29 NOTE — Assessment & Plan Note (Signed)
Hyperlipidemia:Low fat diet discussed and encouraged.   Lipid Panel  Lab Results  Component Value Date   CHOL 266 (H) 04/24/2019   HDL 83 04/24/2019   LDLCALC 162 (H) 04/24/2019   TRIG 97 04/24/2019   CHOLHDL 3.2 04/24/2019   Needs to change diet to lower cholesterol

## 2019-04-29 NOTE — Assessment & Plan Note (Signed)
  Patient re-educated about  the importance of commitment to a  minimum of 150 minutes of exercise per week as able.  The importance of healthy food choices with portion control discussed, as well as eating regularly and within a 12 hour window most days. The need to choose "clean , green" food 50 to 75% of the time is discussed, as well as to make water the primary drink and set a goal of 64 ounces water daily.    Weight /BMI 04/24/2019 12/11/2018 06/02/2018  WEIGHT 210 lb 205 lb 204 lb  HEIGHT 5\' 5"  5\' 5"  5\' 5"   BMI 34.95 kg/m2 34.11 kg/m2 33.95 kg/m2    Start contrave with daily zofran if needed initially

## 2019-04-29 NOTE — Assessment & Plan Note (Signed)
Uncontrolled, start twice daily prevacid, also modify diet to limit caffeine , and work on weight loss

## 2019-04-29 NOTE — Assessment & Plan Note (Signed)
After obtaining informed consent, the vaccine is  administered , with no adverse effect noted at the time of administration.   shingrix #2

## 2019-04-29 NOTE — Progress Notes (Signed)
Kristy Travis     MRN: [id number]      DOB: 04/07/1966   HPI [name] is here for follow up and re-evaluation of chronic medical conditions, medication management and review of any available recent lab and radiology data.  Preventive health is updated, specifically  Cancer screening and Immunization.   Questions or concerns regarding consultations or procedures which the PT has had in the interim are  addressed. The PT denies any adverse reactions to current medications since the last visit.  C/o weight gain and unable to tolerate contrave due to nausea, denies vomiting. C/o severe reflux, states not responding to medication being used C/o itchy rash on both forearms, x 2 months, prsisting  ROS Denies recent fever or chills. Denies sinus pressure, nasal congestion, ear pain or sore throat. Denies chest congestion, productive cough or wheezing. Denies chest pains, palpitations and leg swelling Denies abdominal pain, nausea, vomiting,diarrhea or constipation.   Denies dysuria, frequency, hesitancy or incontinence. C/o chronic joint pain, and limitation in mobility. Denies headaches, seizures, numbness, or tingling. Denies depression, anxiety or insomnia. Denies skin breakdown.   PE  BP 132/90   Pulse 74   Temp 98 F (36.7 C)   Resp 15   Ht 5\' 5"  (1.651 m)   Wt 210 lb (95.3 kg)   LMP 06/24/2014   SpO2 100%   BMI 34.95 kg/m   Patient alert and oriented and in no cardiopulmonary distress.  HEENT: No facial asymmetry, EOMI,     Neck supple .  Chest: Clear to auscultation bilaterally.  CVS: S1, S2 no murmurs, no S3.Regular rate.  ABD: Soft non tender.   Ext: No edema  MS: Adequate though reduced  ROM spine,  hips and knees.  Skin: Intact, fungal rash on both forearms  Psych: Good eye contact, normal affect. Memory intact not anxious or depressed appearing.  CNS: CN 2-12 intact, power,  normal throughout.no focal deficits noted.   Assessment &  Plan  Essential hypertension Sub optimal control, weight loss to lower reading , no additional medication  DASH diet and commitment to daily physical activity for a minimum of 30 minutes discussed and encouraged, as a part of hypertension management. The importance of attaining a healthy weight is also discussed.  BP/Weight 04/24/2019 12/11/2018 06/02/2018 05/30/2018 03/31/2018 01/16/2018 12/12/2017  Systolic BP 132 148 147 117 126 120 131  Diastolic BP 90 98 75 68 80 78 72  Wt. (Lbs) 210 205 204 204 204.5 211 207.2  BMI 34.95 34.11 33.95 33.95 34.03 35.11 34.48        GERD (gastroesophageal reflux disease) Uncontrolled, start twice daily prevacid, also modify diet to limit caffeine , and work on weight loss  Obesity  Patient re-educated about  the importance of commitment to a  minimum of 150 minutes of exercise per week as able.  The importance of healthy food choices with portion control discussed, as well as eating regularly and within a 12 hour window most days. The need to choose "clean , green" food 50 to 75% of the time is discussed, as well as to make water the primary drink and set a goal of 64 ounces water daily.    Weight /BMI 04/24/2019 12/11/2018 06/02/2018  WEIGHT 210 lb 205 lb 204 lb  HEIGHT 5\' 5"  5\' 5"  5\' 5"   BMI 34.95 kg/m2 34.11 kg/m2 33.95 kg/m2    Start contrave with daily zofran if needed initially  Need for shingles vaccine After obtaining informed consent, the  vaccine is  administered , with no adverse effect noted at the time of administration.   shingrix #2  Hyperlipidemia Hyperlipidemia:Low fat diet discussed and encouraged.   Lipid Panel  Lab Results  Component Value Date   CHOL 266 (H) 04/24/2019   HDL 83 04/24/2019   LDLCALC 162 (H) 04/24/2019   TRIG 97 04/24/2019   CHOLHDL 3.2 04/24/2019   Needs to change diet to lower cholesterol

## 2019-06-25 ENCOUNTER — Telehealth: Payer: Self-pay | Admitting: *Deleted

## 2019-06-25 NOTE — Telephone Encounter (Signed)
Pt wanted to let dr simpson know that her father lloyd lipscomb passed away two weeks ago and she wanted to thank everyone in the office for all that they did for him

## 2019-06-25 NOTE — Telephone Encounter (Signed)
I spoke directly with her and expressed heartfelt condolence

## 2019-06-26 ENCOUNTER — Ambulatory Visit: Payer: 59 | Admitting: Family Medicine

## 2019-07-03 ENCOUNTER — Ambulatory Visit: Payer: 59 | Admitting: Family Medicine

## 2020-01-17 ENCOUNTER — Other Ambulatory Visit: Payer: Self-pay

## 2020-01-17 ENCOUNTER — Other Ambulatory Visit: Payer: Self-pay | Admitting: *Deleted

## 2020-01-17 ENCOUNTER — Ambulatory Visit (INDEPENDENT_AMBULATORY_CARE_PROVIDER_SITE_OTHER): Payer: 59 | Admitting: Family Medicine

## 2020-01-17 ENCOUNTER — Encounter: Payer: Self-pay | Admitting: Family Medicine

## 2020-01-17 VITALS — BP 138/78 | HR 71 | Temp 97.6°F | Resp 16 | Ht 65.0 in | Wt 200.8 lb

## 2020-01-17 DIAGNOSIS — E6609 Other obesity due to excess calories: Secondary | ICD-10-CM | POA: Diagnosis not present

## 2020-01-17 DIAGNOSIS — Z1211 Encounter for screening for malignant neoplasm of colon: Secondary | ICD-10-CM

## 2020-01-17 DIAGNOSIS — Z6833 Body mass index (BMI) 33.0-33.9, adult: Secondary | ICD-10-CM

## 2020-01-17 DIAGNOSIS — E7849 Other hyperlipidemia: Secondary | ICD-10-CM

## 2020-01-17 DIAGNOSIS — Z131 Encounter for screening for diabetes mellitus: Secondary | ICD-10-CM | POA: Diagnosis not present

## 2020-01-17 DIAGNOSIS — E559 Vitamin D deficiency, unspecified: Secondary | ICD-10-CM | POA: Diagnosis not present

## 2020-01-17 DIAGNOSIS — E66811 Obesity, class 1: Secondary | ICD-10-CM

## 2020-01-17 DIAGNOSIS — K219 Gastro-esophageal reflux disease without esophagitis: Secondary | ICD-10-CM

## 2020-01-17 DIAGNOSIS — I10 Essential (primary) hypertension: Secondary | ICD-10-CM | POA: Diagnosis not present

## 2020-01-17 DIAGNOSIS — Z0001 Encounter for general adult medical examination with abnormal findings: Secondary | ICD-10-CM

## 2020-01-17 DIAGNOSIS — R7303 Prediabetes: Secondary | ICD-10-CM | POA: Insufficient documentation

## 2020-01-17 DIAGNOSIS — Z1231 Encounter for screening mammogram for malignant neoplasm of breast: Secondary | ICD-10-CM

## 2020-01-17 DIAGNOSIS — R222 Localized swelling, mass and lump, trunk: Secondary | ICD-10-CM

## 2020-01-17 DIAGNOSIS — M7989 Other specified soft tissue disorders: Secondary | ICD-10-CM

## 2020-01-17 LAB — LIPID PANEL
Cholesterol: 243 mg/dL — ABNORMAL HIGH (ref <200)
HDL: 76 mg/dL (ref >=50)
LDL Cholesterol (Calc): 147 mg/dL (calc) — ABNORMAL HIGH
Non-HDL Cholesterol (Calc): 167 mg/dL (calc) — ABNORMAL HIGH (ref <130)
Total CHOL/HDL Ratio: 3.2 (calc) (ref <5.0)
Triglycerides: 95 mg/dL (ref <150)

## 2020-01-17 LAB — COMPLETE METABOLIC PANEL WITH GFR
AG Ratio: 1.9 (calc) (ref 1.0–2.5)
ALT: 15 U/L (ref 6–29)
AST: 18 U/L (ref 10–35)
Albumin: 4.7 g/dL (ref 3.6–5.1)
Alkaline phosphatase (APISO): 80 U/L (ref 37–153)
BUN: 17 mg/dL (ref 7–25)
CO2: 30 mmol/L (ref 20–32)
Calcium: 9.8 mg/dL (ref 8.6–10.4)
Chloride: 104 mmol/L (ref 98–110)
Creat: 0.56 mg/dL (ref 0.50–1.05)
GFR, Est African American: 124 mL/min/{1.73_m2} (ref >=60)
GFR, Est Non African American: 107 mL/min/{1.73_m2} (ref >=60)
Globulin: 2.5 g/dL (calc) (ref 1.9–3.7)
Glucose, Bld: 89 mg/dL (ref 65–99)
Potassium: 4.2 mmol/L (ref 3.5–5.3)
Sodium: 140 mmol/L (ref 135–146)
Total Bilirubin: 0.4 mg/dL (ref 0.2–1.2)
Total Protein: 7.2 g/dL (ref 6.1–8.1)

## 2020-01-17 LAB — POCT GLYCOSYLATED HEMOGLOBIN (HGB A1C)
HbA1c POC (<> result, manual entry): 5.6 % (ref 4.0–5.6)
HbA1c, POC (controlled diabetic range): 5.6 % (ref 0.0–7.0)
HbA1c, POC (prediabetic range): 5.6 % — AB (ref 5.7–6.4)
Hemoglobin A1C: 5.6 % (ref 4.0–5.6)

## 2020-01-17 LAB — CBC
HCT: 40.5 % (ref 35.0–45.0)
Hemoglobin: 12.8 g/dL (ref 11.7–15.5)
MCH: 25.7 pg — ABNORMAL LOW (ref 27.0–33.0)
MCHC: 31.6 g/dL — ABNORMAL LOW (ref 32.0–36.0)
MCV: 81.3 fL (ref 80.0–100.0)
MPV: 10.4 fL (ref 7.5–12.5)
Platelets: 261 10*3/uL (ref 140–400)
RBC: 4.98 10*6/uL (ref 3.80–5.10)
RDW: 13.2 % (ref 11.0–15.0)
WBC: 5.1 10*3/uL (ref 3.8–10.8)

## 2020-01-17 LAB — TSH: TSH: 1.36 mIU/L

## 2020-01-17 LAB — VITAMIN D 25 HYDROXY (VIT D DEFICIENCY, FRACTURES): Vit D, 25-Hydroxy: 22 ng/mL — ABNORMAL LOW (ref 30–100)

## 2020-01-17 MED ORDER — OMEPRAZOLE 20 MG PO CPDR: 20.0000 mg | DELAYED_RELEASE_CAPSULE | Freq: Two times a day (BID) | ORAL | 1 refills | Status: DC

## 2020-01-17 MED ORDER — SPIRONOLACTONE 25 MG PO TABS: 12.5000 mg | ORAL_TABLET | Freq: Every day | ORAL | 0 refills | Status: DC

## 2020-01-17 NOTE — Assessment & Plan Note (Signed)
Improved by 5 pounds but needs to keep better control over her diet including increase in exercise.  Wt Readings from Last 3 Encounters:  01/17/20 200 lb 12.8 oz (91.1 kg)  04/24/19 210 lb (95.3 kg)  12/11/18 205 lb (93 kg)

## 2020-01-17 NOTE — Assessment & Plan Note (Signed)
Uncontrolled still with Prevacid.  Reported it worked for a while but not anymore.  Will do Prilosec to see if that is better.  Reports that she is trying to limit her caffeine intake as well.  She avoids certain foods that are triggers for her as well.  Advised for her to take this on empty stomach increase water intake.  And not to eat 2 to 3 hours before bed.

## 2020-01-17 NOTE — Assessment & Plan Note (Signed)
Questionable soft tissue lipoma versus cyst versus mass on the upper mid sternum. Ultrasound to assess in better detail.

## 2020-01-17 NOTE — Assessment & Plan Note (Signed)
Discussed monthly self breast exams and yearly mammograms; at least 30 minutes of aerobic activity at least 5 days/week and weight-bearing exercise 2x/week; proper sunscreen use reviewed; healthy diet, including goals of calcium and vitamin D intake and alcohol recommendations (less than or equal to 1 drink/day) reviewed; regular seatbelt use; changing batteries in smoke detectors.  Immunization recommendations discussed.  Colonoscopy recommendations reviewed.  

## 2020-01-17 NOTE — Patient Instructions (Addendum)
I appreciate the opportunity to provide you with care for your health and wellness. Today we discussed: overall health   Follow up: 6 months   Labs fasting today Referrals today: GI for colonoscopy Orders: [place] for chest, and mammogram  Start new medication as directed. Hope this helps heartburn. Call in 2 months if doing well we will refill 3 months Also take fluid medication to help with BP control and swelling, call in one month if doing well with this and we will refill 3 month supply (AVOID SALT).  Have a great SUMMER!  Please continue to practice social distancing to keep you, your family, and our community safe.  If you must go out, please wear a mask and practice good handwashing.  It was a pleasure to see you and I look forward to continuing to work together on your health and well-being. Please do not hesitate to call the office if you need care or have questions about your care.  Have a wonderful day and week. With Gratitude, Tereasa Coop, DNP, AGNP-BC

## 2020-01-17 NOTE — Assessment & Plan Note (Signed)
A1c checked.  Outside of prediabetic range.  Encouraged to continue controlled diet to prevent development.

## 2020-01-17 NOTE — Assessment & Plan Note (Signed)
Has not been taking the prescription did have bypass should be on at least over-the-counter.  Will be getting updated labs  to see if prescription strength is needed versus over-the-counter.

## 2020-01-17 NOTE — Assessment & Plan Note (Signed)
Needs colonoscopy.  Referral placed today.

## 2020-01-17 NOTE — Assessment & Plan Note (Signed)
Kristy Travis is encouraged to maintain a well balanced diet that is low in salt. Reports that she has not been taking the spironolactone.  Blood pressures on the higher end of normal.  We will restart this.  At lower dose and taper up it continue toleration Additionally, she is also reminded that exercise is beneficial for heart health and control of  Blood pressure. 30-60 minutes daily is recommended-walking was suggested.

## 2020-01-17 NOTE — Progress Notes (Signed)
Health Maintenance reviewed -   Immunization History  Administered Date(s) Administered  . Influenza Whole 04/11/2016  . Influenza,inj,Quad PF,6+ Mos 03/20/2015  . Td 01/21/2004  . Tdap 02/18/2015  . Zoster Recombinat (Shingrix) 12/11/2018, 04/24/2019   Last Pap smear: 12/2018 Last mammogram: needs to schedule Last colonoscopy: needs schedule Last DEXA: n/a Dentist: yes yearly Ophtho: no vision changes Exercise: gym 30 mins daily  Smoker: no  Alcohol Use: no  Other doctors caring for patient include:  Patient Care Team: Kerri Perches, MD as PCP - General  End of Life Discussion:  Patient does not have a living will and medical power of attorney.   Subjective:   HPI  Kristy Travis is a 54 y.o. female who presents for annual wellness visit and follow-up on chronic medical conditions.  She has the following concerns: Main concerns today include increased acid reflux even on Prevacid, has had bariatric surgery she reports that this is probably the cause that she was told that it can make it better or worse.  She also has had a rash when she goes out in the sun.  She reports that her mother had this rash as well.  And that she was told she had too much acid in her system.  However she is not sure about treatment.  Additionally she has a tender questionable lipoma/bump area of her mid upper sternum that is been giving her trouble at least for the last 3 months or so.  She is pretty active with her upper body when she is working.  However she denies having any injury or trauma to the site.   She reports some sleep trouble secondary to menopause.  Additionally she reports that since menopause she has had some increase in forgetfulness and memory changes.  She reports that she goes to the dentist regularly.  She reports that she has some constipation at times but otherwise no issues with bowel or bladder habits.  No blood in urine or stool.  Does need to get colonoscopy  ordered.  She denies having any falls or injury.  She reports skin rash as stated above.  She denies having any vision changes or concerns.  She denies having any chest pain, headaches, shortness of breath, palpitations, dizziness, vision changes or anything of that nature.  She reports that she has ran out of her prescription strength vitamin D.  However she needs to get updated labs to see if prescription strength is still needed.  Additionally she reports that she has not taken her flu medication that was placed on her list and she reports that she had some increased swelling.  She does not eat the best diet and eats out some as well.   Review Of Systems  Review of Systems  HENT: Negative.   Eyes: Negative.   Respiratory: Negative.   Cardiovascular: Negative.   Gastrointestinal: Negative.   Endocrine: Negative.   Genitourinary: Negative.   Musculoskeletal: Negative.   Skin: Positive for rash.       See HPI  Allergic/Immunologic: Negative.   Neurological: Negative.   Hematological: Negative.   Psychiatric/Behavioral: Positive for sleep disturbance.  All other systems reviewed and are negative.   Objective:   PHYSICAL EXAM:  BP 138/78 (BP Location: Right Arm, Patient Position: Sitting, Cuff Size: Normal)   Pulse 71   Temp 97.6 F (36.4 C) (Temporal)   Resp 16   Ht 5\' 5"  (1.651 m)   Wt 200 lb 12.8 oz (91.1  kg)   LMP 06/24/2014   SpO2 94%   BMI 33.41 kg/m    Physical Exam Vitals and nursing note reviewed.  Constitutional:      Appearance: Normal appearance. She is well-developed and well-groomed. She is obese.  HENT:     Head: Normocephalic and atraumatic.     Right Ear: Hearing, tympanic membrane, ear canal and external ear normal.     Left Ear: Hearing, tympanic membrane, ear canal and external ear normal.     Nose: Nose normal.     Mouth/Throat:     Lips: Pink.     Mouth: Mucous membranes are moist.     Pharynx: Oropharynx is clear. Uvula midline.  Eyes:      General: Lids are normal.     Extraocular Movements: Extraocular movements intact.     Conjunctiva/sclera: Conjunctivae normal.     Pupils: Pupils are equal, round, and reactive to light.  Neck:     Thyroid: No thyroid mass, thyromegaly or thyroid tenderness.     Vascular: No carotid bruit.  Cardiovascular:     Rate and Rhythm: Normal rate and regular rhythm.     Pulses: Normal pulses.          Radial pulses are 2+ on the right side and 2+ on the left side.       Dorsalis pedis pulses are 2+ on the right side and 2+ on the left side.     Heart sounds: Normal heart sounds.  Pulmonary:     Effort: Pulmonary effort is normal.     Breath sounds: Normal breath sounds and air entry.  Chest:     Chest wall: Mass and swelling present.    Abdominal:     General: Abdomen is flat. Bowel sounds are normal.     Palpations: Abdomen is soft.     Tenderness: There is no abdominal tenderness. There is no right CVA tenderness or left CVA tenderness.     Hernia: No hernia is present.  Musculoskeletal:        General: Normal range of motion.     Cervical back: Normal range of motion and neck supple.     Right lower leg: No edema.     Left lower leg: No edema.     Comments: MAE, ROM intact   Feet:     Right foot:     Skin integrity: Skin integrity normal.     Left foot:     Skin integrity: Skin integrity normal.  Lymphadenopathy:     Cervical: No cervical adenopathy.  Skin:    General: Skin is warm and dry.     Capillary Refill: Capillary refill takes less than 2 seconds.  Neurological:     General: No focal deficit present.     Mental Status: She is alert and oriented to person, place, and time. Mental status is at baseline.     Cranial Nerves: Cranial nerves are intact.     Sensory: Sensation is intact.     Motor: Motor function is intact.     Coordination: Coordination is intact.     Gait: Gait is intact.     Deep Tendon Reflexes: Reflexes are normal and symmetric.  Psychiatric:         Attention and Perception: Attention and perception normal.        Mood and Affect: Mood and affect normal.        Speech: Speech normal.        Behavior: Behavior  normal. Behavior is cooperative.        Thought Content: Thought content normal.        Cognition and Memory: Cognition and memory normal.        Judgment: Judgment normal.     Comments: Good communication and eye contact      Depression Screening  [facility] 01/17/2020 01/16/2018 01/16/2018 12/21/2016 06/28/2016  Decreased Interest 0 0 0 0 0  Down, Depressed, Hopeless 0 0 0 0 0  PHQ - 2 Score 0 0 0 0 0  Altered sleeping 0 3 3 - -  Tired, decreased energy 0 3 3 - -  Change in appetite 0 0 0 - -  Feeling bad or failure about yourself  0 0 0 - -  Trouble concentrating 0 0 0 - -  Moving slowly or fidgety/restless 0 0 0 - -  Suicidal thoughts 0 0 0 - -  PHQ-9 Score 0 6 6 - -  Difficult doing work/chores Not difficult at all - Somewhat difficult - -     Falls  Fall Risk  01/17/2020 04/24/2019 12/11/2018 06/28/2016 04/30/2013  Falls in the past year? 0 0 0 No No  Number falls in past yr: 0 0 0 - -  Injury with Fall? 0 0 0 - -  Risk for fall due to : No Fall Risks - - - -  Follow up Falls evaluation completed - - - -    Assessment & Plan:   1. Annual visit for general adult medical examination with abnormal findings   2. Diabetes mellitus screening   3. Essential hypertension   4. Gastroesophageal reflux disease, unspecified whether esophagitis present   5. Vitamin D deficiency   6. Other hyperlipidemia   7. Class 1 obesity due to excess calories with serious comorbidity and body mass index (BMI) of 33.0 to 33.9 in adult   8. Screening mammogram, encounter for   9. Encounter for screening for malignant neoplasm of colon   10. Mass of soft tissue of chest     Tests ordered Orders Placed This Encounter  Procedures  . MM 3D SCREEN BREAST BILATERAL  . CBC  . COMPLETE METABOLIC PANEL WITH GFR  . Lipid  panel  . TSH  . VITAMIN D 25 Hydroxy (Vit-D Deficiency, Fractures)  . Ambulatory referral to Gastroenterology  . POCT glycosylated hemoglobin (Hb A1C)     Plan: Please see assessment and plan per problem list above.   Meds ordered this encounter  Medications  . omeprazole (PRILOSEC) 20 MG capsule    Sig: Take 1 capsule (20 mg total) by mouth 2 (two) times daily before a meal.    Dispense:  60 capsule    Refill:  1    Order Specific Question:   Supervising Provider    Answer:   SIMPSON, MARGARET E [[id number]]  . spironolactone (ALDACTONE) 25 MG tablet    Sig: Take 0.5 tablets (12.5 mg total) by mouth daily.    Dispense:  30 tablet    Refill:  0    Order Specific Question:   Supervising Provider    Answer:   Syliva OvermanSIMPSON, MARGARET E [[id number]]     I have personally reviewed: The patient's medical and social history Their use of alcohol, tobacco or illicit drugs Their current medications and supplements The patient's functional ability including ADLs,fall risks, home safety risks, cognitive, and hearing and visual impairment Diet and physical activities Evidence for depression or mood disorders  The patient's  weight, height, BMI, and visual acuity have been recorded in the chart.  I have made referrals, counseling, and provided education to the patient based on review of the above and I have provided the patient with a written personalized care plan for preventive services.    Note: This dictation was prepared with Dragon dictation along with smaller phrase technology. Similar sounding words can be transcribed inadequately or may not be corrected upon review. Any transcriptional errors that result from this process are unintentional.      Freddy Finner, NP   01/17/2020

## 2020-01-17 NOTE — Assessment & Plan Note (Signed)
Heart healthy diet is recommended.  Updated labs ordered.

## 2020-01-17 NOTE — Assessment & Plan Note (Signed)
Encouraged to get mammogram sooner than later as she is a year and a half behind

## 2020-01-18 ENCOUNTER — Encounter (INDEPENDENT_AMBULATORY_CARE_PROVIDER_SITE_OTHER): Payer: Self-pay | Admitting: *Deleted

## 2020-01-22 ENCOUNTER — Other Ambulatory Visit: Payer: Self-pay | Admitting: Family Medicine

## 2020-01-22 DIAGNOSIS — E7849 Other hyperlipidemia: Secondary | ICD-10-CM

## 2020-01-22 MED ORDER — ROSUVASTATIN CALCIUM 5 MG PO TABS: 5.0000 mg | ORAL_TABLET | Freq: Every day | ORAL | 3 refills | Status: DC

## 2020-01-23 ENCOUNTER — Ambulatory Visit
Admission: RE | Admit: 2020-01-23 | Discharge: 2020-01-23 | Disposition: A | Discharge status: Home / Self Care | Payer: 59 | Source: Ambulatory Visit | Attending: Family Medicine | Admitting: Family Medicine

## 2020-01-23 ENCOUNTER — Other Ambulatory Visit: Payer: Self-pay

## 2020-01-23 DIAGNOSIS — R222 Localized swelling, mass and lump, trunk: Secondary | ICD-10-CM | POA: Diagnosis not present

## 2020-01-24 DIAGNOSIS — M25562 Pain in left knee: Secondary | ICD-10-CM | POA: Diagnosis not present

## 2020-01-24 DIAGNOSIS — M25561 Pain in right knee: Secondary | ICD-10-CM | POA: Diagnosis not present

## 2020-01-24 DIAGNOSIS — M170 Bilateral primary osteoarthritis of knee: Secondary | ICD-10-CM | POA: Diagnosis not present

## 2020-02-05 DIAGNOSIS — M25562 Pain in left knee: Secondary | ICD-10-CM | POA: Diagnosis not present

## 2020-02-05 DIAGNOSIS — M25561 Pain in right knee: Secondary | ICD-10-CM | POA: Diagnosis not present

## 2020-02-05 DIAGNOSIS — M172 Bilateral post-traumatic osteoarthritis of knee: Secondary | ICD-10-CM | POA: Diagnosis not present

## 2020-02-16 ENCOUNTER — Other Ambulatory Visit: Payer: Self-pay | Admitting: Obstetrics and Gynecology

## 2020-02-16 MED ORDER — GABAPENTIN 600 MG PO TABS: 600.0000 mg | ORAL_TABLET | Freq: Every day | ORAL | 3 refills | Status: DC

## 2020-03-10 DIAGNOSIS — M172 Bilateral post-traumatic osteoarthritis of knee: Secondary | ICD-10-CM | POA: Diagnosis not present

## 2020-03-11 ENCOUNTER — Encounter: Payer: Self-pay | Admitting: Obstetrics and Gynecology

## 2020-03-11 ENCOUNTER — Other Ambulatory Visit: Payer: Self-pay

## 2020-03-11 ENCOUNTER — Ambulatory Visit (INDEPENDENT_AMBULATORY_CARE_PROVIDER_SITE_OTHER): Payer: 59 | Admitting: Obstetrics and Gynecology

## 2020-03-11 VITALS — BP 108/77 | HR 94 | Ht 65.0 in | Wt 198.8 lb

## 2020-03-11 DIAGNOSIS — Z01419 Encounter for gynecological examination (general) (routine) without abnormal findings: Secondary | ICD-10-CM

## 2020-03-11 DIAGNOSIS — Z1231 Encounter for screening mammogram for malignant neoplasm of breast: Secondary | ICD-10-CM | POA: Diagnosis not present

## 2020-03-11 DIAGNOSIS — Z90710 Acquired absence of both cervix and uterus: Secondary | ICD-10-CM | POA: Diagnosis not present

## 2020-03-11 DIAGNOSIS — N951 Menopausal and female climacteric states: Secondary | ICD-10-CM | POA: Diagnosis not present

## 2020-03-11 DIAGNOSIS — E785 Hyperlipidemia, unspecified: Secondary | ICD-10-CM

## 2020-03-11 NOTE — Progress Notes (Signed)
Pt present for annual exam. Pt stated that she was doing well no problems.  

## 2020-03-11 NOTE — Progress Notes (Signed)
ANNUAL PREVENTATIVE CARE GYN  ENCOUNTER NOTE  Subjective:       Kristy Travis Kristy Travis is a 54 y.o. [phone] female here for a routine annual gynecologic exam.  The patient wears seatbelts.   The patient does not engage in regular exercise.  She is curently sexually active.    Current complaints: 1.  Vasomotor symptoms (night sweats, hot flushes), worse at night.  Has tried OTC herbal remedies.  Also tried Gabapentin recently but notes it made her sick so discontinued after only 1-2 days of use.  2. Reports having knee injections yesterday for her arthritis.   Gynecologic History Patient's last menstrual period was 06/24/2014. Contraception: status post hysterectomy Last Pap: 11/2012. Results were: normal.  Denies h/o abnormal pap smears.  Last mammogram: 08/03/2017. Results were: normal.  Last colonoscopy: 12/24/2017.  Results were: normal.    Obstetric History OB History  Gravida Para Term Preterm AB Living  3 2 2   1 2   SAB TAB Ectopic Multiple Live Births  1            # Outcome Date GA Lbr Len/2nd Weight Sex Delivery Anes PTL Lv  3 SAB           2 Term      Vag-Spont     1 Term      Vag-Spont       Past Medical History:  Diagnosis Date  . Arthritis   . BACK PAIN, LUMBAR 01/15/2008   Qualifier: Diagnosis of  By: Lodema HongSimpson MD, Claris CheMargaret    . Carpal tunnel syndrome   . Chronic fatigue   . GERD (gastroesophageal reflux disease)   . GERD (gastroesophageal reflux disease) 04/06/2017  . Glucose intolerance (impaired glucose tolerance) 12/12/2017  . Hypertension    per patient this was prior to weight loss surgery/cn 12/01/17  . Kidney stone on right side 12/21/2016  . Metabolic syndrome X 09/02/2010   Qualifier: Diagnosis of  By: Lillia MountainHudy LPN, Brandi    . Need for shingles vaccine 12/11/2018  . Obesity   . Pancreatitis 07/2014   had  gall bladder removed , spent 1 week in hospital  . PONV (postoperative nausea and vomiting)   . Seasonal allergies 04/04/2012    Past Surgical  History:  Procedure Laterality Date  . ABDOMINAL HYSTERECTOMY    . 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 CHOLECYSTECTOMY  07/2014  . cyst removed from left foot and bone removed from left foot  April and June 2010  . DILATION AND CURETTAGE OF UTERUS    . ESOPHAGOGASTRODUODENOSCOPY N/A 03/03/2016   Procedure: ESOPHAGOGASTRODUODENOSCOPY (EGD);  Surgeon: Malissa HippoNajeeb U Rehman, MD;  Location: [facility];  Service: Endoscopy;  Laterality: N/A;  3:00 - moved to 2:00 - Ann to notify pt  . KNEE ARTHROSCOPY Right 12/12/2017   Procedure: ARTHROSCOPY KNEE, MEDIAL CHONDROPLASTY, LOOSE BODY REMOVAL,PARTIAL LATERAL MENISCECTOMY;  Surgeon: Donato HeinzHooten, James P, MD;  Location: [facility];  Service: Orthopedics;  Laterality: Right;  . KNEE ARTHROSCOPY Left 06/02/2018   Procedure: ARTHROSCOPY KNEE, MEDIAL CHONDROPLASTY, PARTIAL MEDIAL MENISCECTOMY;  Surgeon: Donato HeinzHooten, James P, MD;  Location: [facility];  Service: Orthopedics;  Laterality: Left;  . laparoscopy to eval dyspepsia  2004  . TOTAL VAGINAL HYSTERECTOMY  06/2014   with BSO     Current Outpatient Medications on File Prior to Visit  Medication Sig Dispense Refill  . diclofenac sodium (VOLTAREN) 1 % GEL Apply 2 g topically 4 (four) times daily. For knees    .  omeprazole (PRILOSEC) 20 MG capsule Take 1 capsule (20 mg total) by mouth 2 (two) times daily before a meal. 60 capsule 1  . rosuvastatin (CRESTOR) 5 MG tablet Take 1 tablet (5 mg total) by mouth daily. 30 tablet 3  . gabapentin (NEURONTIN) 600 MG tablet Take 1 tablet (600 mg total) by mouth at bedtime. (Patient not taking: Reported on 03/11/2020) 30 tablet 3   No current facility-administered medications on file prior to visit.    Allergies  Allergen Reactions  . Maxzide [Triamterene-Hctz] Nausea Only    Social History   Socioeconomic History  . Marital status: Married    Spouse name: Not on file  . Number of children: Not on file  . Years of education: Not on file  . Highest education  level: Not on file  Occupational History  . Not on file  Tobacco Use  . Smoking status: Never Smoker  . Smokeless tobacco: Never Used  Vaping Use  . Vaping Use: Never used  Substance and Sexual Activity  . Alcohol use: Not Currently    Comment: occasionally  . Drug use: No  . Sexual activity: Yes    Birth control/protection: Surgical  Other Topics Concern  . Not on file  Social History Narrative  . Not on file   Social Determinants of Health   Financial Resource Strain:   . Difficulty of Paying Living Expenses: Not on file  Food Insecurity:   . Worried About Programme researcher, broadcasting/film/video in the Last Year: Not on file  . Ran Out of Food in the Last Year: Not on file  Transportation Needs:   . Lack of Transportation (Medical): Not on file  . Lack of Transportation (Non-Medical): Not on file  Physical Activity:   . Days of Exercise per Week: Not on file  . Minutes of Exercise per Session: Not on file  Stress:   . Feeling of Stress : Not on file  Social Connections:   . Frequency of Communication with Friends and Family: Not on file  . Frequency of Social Gatherings with Friends and Family: Not on file  . Attends Religious Services: Not on file  . Active Member of Clubs or Organizations: Not on file  . Attends Banker Meetings: Not on file  . Marital Status: Not on file  Intimate Partner Violence:   . Fear of Current or Ex-Partner: Not on file  . Emotionally Abused: Not on file  . Physically Abused: Not on file  . Sexually Abused: Not on file    Family History  Problem Relation Age of Onset  . Thrombosis Mother   . Diabetes Father   . Pulmonary Hypertension Father   . Breast cancer Neg Hx   . Bladder Cancer Neg Hx   . Kidney cancer Neg Hx       Review of Systems ROS Review of Systems - General ROS: negative for - chills, fatigue, fever, weight gain or weight loss.  Positive for hot flashes and night sweats, Psychological ROS: negative for - anxiety,  decreased libido, depression, mood swings, physical abuse or sexual abuse.  Ophthalmic ROS: negative for - blurry vision, eye pain or loss of vision ENT ROS: negative for - headaches, hearing change, visual changes or vocal changes Allergy and Immunology ROS: negative for - hives, itchy/watery eyes or seasonal allergies Hematological and Lymphatic ROS: negative for - bleeding problems, bruising, swollen lymph nodes or weight loss Endocrine ROS: negative for - galactorrhea, hair pattern changes, malaise/lethargy, mood  swings, palpitations, polydipsia/polyuria, skin changes, temperature intolerance or unexpected weight changes Breast ROS: negative for - new or changing breast lumps or nipple discharge Respiratory ROS: negative for - cough or shortness of breath Cardiovascular ROS: negative for - chest pain, irregular heartbeat, palpitations or shortness of breath Gastrointestinal ROS: no abdominal pain, change in bowel habits, or black or bloody stools Genito-Urinary ROS: negative for dysuria, trouble voiding, or hematuria Musculoskeletal ROS: negative for - joint pain or joint stiffness Neurological ROS: negative for - bowel and bladder control changes Dermatological ROS: negative for rash and skin lesion changes   Objective:   BP 108/77   Pulse 94   Ht 5\' 5"  (1.651 m)   Wt 198 lb 12.8 oz (90.2 kg)   LMP 06/24/2014   BMI 33.08 kg/m    CONSTITUTIONAL: Well-developed, well-nourished female in no acute distress. Mild obesity PSYCHIATRIC: Normal mood and affect. Normal behavior. Normal judgment and thought content. NEUROLGIC: Alert and oriented to person, place, and time. Normal muscle tone coordination. No cranial nerve deficit noted. HENT:  Normocephalic, atraumatic, External right and left ear normal. Oropharynx is clear and moist EYES: Conjunctivae and EOM are normal. Pupils are equal, round, and reactive to light. No scleral icterus.  NECK: Normal range of motion, supple, no masses.   Normal thyroid.  SKIN: Skin is warm and dry. No rash noted. Not diaphoretic. No erythema. No pallor. CARDIOVASCULAR: Normal heart rate noted, regular rhythm, no murmur. RESPIRATORY: Clear to auscultation bilaterally. Effort and breath sounds normal, no problems with respiration noted. BREASTS: Symmetric in size. No masses, skin changes, nipple drainage, or lymphadenopathy. ABDOMEN: Soft, normal bowel sounds, no distention noted.  No tenderness, rebound or guarding.  BLADDER: Normal PELVIC:  Bladder no bladder distension noted  Urethra: normal appearing urethra with no masses, tenderness or lesions  Vulva: normal appearing vulva with no masses, tenderness or lesions  Vagina: normal appearing vagina with normal color, no discharge or lesions.    Cervix: surgically absent  Uterus: not indicated and surgically absent, vaginal cuff well healed  Adnexa: surgically absent bilateral  Rectal: not indicated MUSCULOSKELETAL: Normal range of motion. No tenderness.  No cyanosis, clubbing, or edema.  2+ distal pulses. LYMPHATIC: No Axillary, Supraclavicular, or Inguinal Adenopathy.     Labs:  Lab Results  Component Value Date   WBC 5.1 01/17/2020   HGB 12.8 01/17/2020   HCT 40.5 01/17/2020   MCV 81.3 01/17/2020   PLT 261 01/17/2020    Lab Results  Component Value Date   CREATININE 0.56 01/17/2020   BUN 17 01/17/2020   NA 140 01/17/2020   K 4.2 01/17/2020   CL 104 01/17/2020   CO2 30 01/17/2020    Lab Results  Component Value Date   ALT 15 01/17/2020   AST 18 01/17/2020   ALKPHOS 80 11/14/2018   BILITOT 0.4 01/17/2020    Lab Results  Component Value Date   TSH 1.36 01/17/2020    Lab Results  Component Value Date   CHOL 243 (H) 01/17/2020   HDL 76 01/17/2020   LDLCALC 147 (H) 01/17/2020   TRIG 95 01/17/2020   CHOLHDL 3.2 01/17/2020     Lab Results  Component Value Date   HGBA1C 5.6 (A) 01/17/2020     Assessment:   Annual gynecologic examination 54  y.o. Contraception: status post hysterectomy Obesity 1  Vasomotor symptoms Dyslipdemia  Plan:   - Pap: Not needed.  Patient s/p hysterectomy with no h/o abnormal pap smears in the past.  -  Mammogram: Up to date. Next due in January 2020.  - Stool Guaiac Testing:  Not Indicated.   To be scheduled for colonoscopy.  - Labs: previously performed.   - Routine preventative health maintenance measures emphasized: Exercise/Diet/Weight control, Alcohol/Substance use risks and Stress Management - Desires STI testing. Will order. Does not need GC/Cl due to absence of cervix.  - Patient with bothersome menopausal vasomotor symptoms. Discussed lifestyle interventions such as wearing light clothing, remaining in cool environments, having fan/air conditioner in the room, avoiding hot beverages etc.  Discussed using hormone therapy and concerns about increased risk of heart disease, cerebrovascular disease, thromboembolic disease,  and breast cancer.  Also discussed other medical options such as Clonidine, Paxil, Effexor.  Given samples of  Premarin (0.3 and 0.625 mg doses). To call for prescription if desired. To f/u in 4 weeks for reassessment of symptoms.   Symptoms may be helped with initiation of HRT.  -Dyslipidemia, managed by PCP.  - Has completed Pfizer COVID vaccination series.  Return to Clinic - 1 Year for annual exam.    Hildred Laser, MD [facility]

## 2020-03-11 NOTE — Patient Instructions (Addendum)
Preventive Care 40-52 Years Old, Female Preventive care refers to visits with your health care provider and lifestyle choices that can promote health and wellness. This includes:  A yearly physical exam. This may also be called an annual well check.  Regular dental visits and eye exams.  Immunizations.  Screening for certain conditions.  Healthy lifestyle choices, such as eating a healthy diet, getting regular exercise, not using drugs or products that contain nicotine and tobacco, and limiting alcohol use. What can I expect for my preventive care visit? Physical exam Your health care provider will check your:  Height and weight. This may be used to calculate body mass index (BMI), which tells if you are at a healthy weight.  Heart rate and blood pressure.  Skin for abnormal spots. Counseling Your health care provider may ask you questions about your:  Alcohol, tobacco, and drug use.  Emotional well-being.  Home and relationship well-being.  Sexual activity.  Eating habits.  Work and work environment.  Method of birth control.  Menstrual cycle.  Pregnancy history. What immunizations do I need?  Influenza (flu) vaccine  This is recommended every year. Tetanus, diphtheria, and pertussis (Tdap) vaccine  You may need a Td booster every 10 years. Varicella (chickenpox) vaccine  You may need this if you have not been vaccinated. Zoster (shingles) vaccine  You may need this after age 60. Measles, mumps, and rubella (MMR) vaccine  You may need at least one dose of MMR if you were born in 1957 or later. You may also need a second dose. Pneumococcal conjugate (PCV13) vaccine  You may need this if you have certain conditions and were not previously vaccinated. Pneumococcal polysaccharide (PPSV23) vaccine  You may need one or two doses if you smoke cigarettes or if you have certain conditions. Meningococcal conjugate (MenACWY) vaccine  You may need this if you  have certain conditions. Hepatitis A vaccine  You may need this if you have certain conditions or if you travel or work in places where you may be exposed to hepatitis A. Hepatitis B vaccine  You may need this if you have certain conditions or if you travel or work in places where you may be exposed to hepatitis B. Haemophilus influenzae type b (Hib) vaccine  You may need this if you have certain conditions. Human papillomavirus (HPV) vaccine  If recommended by your health care provider, you may need three doses over 6 months. You may receive vaccines as individual doses or as more than one vaccine together in one shot (combination vaccines). Talk with your health care provider about the risks and benefits of combination vaccines. What tests do I need? Blood tests  Lipid and cholesterol levels. These may be checked every 5 years, or more frequently if you are over 50 years old.  Hepatitis C test.  Hepatitis B test. Screening  Lung cancer screening. You may have this screening every year starting at age 55 if you have a 30-pack-year history of smoking and currently smoke or have quit within the past 15 years.  Colorectal cancer screening. All adults should have this screening starting at age 50 and continuing until age 75. Your health care provider may recommend screening at age 45 if you are at increased risk. You will have tests every 1-10 years, depending on your results and the type of screening test.  Diabetes screening. This is done by checking your blood sugar (glucose) after you have not eaten for a while (fasting). You may have this   done every 1-3 years.  Mammogram. This may be done every 1-2 years. Talk with your health care provider about when you should start having regular mammograms. This may depend on whether you have a family history of breast cancer.  BRCA-related cancer screening. This may be done if you have a family history of breast, ovarian, tubal, or peritoneal  cancers.  Pelvic exam and Pap test. This may be done every 3 years starting at age 60. Starting at age 7, this may be done every 5 years if you have a Pap test in combination with an HPV test. Other tests  Sexually transmitted disease (STD) testing.  Bone density scan. This is done to screen for osteoporosis. You may have this scan if you are at high risk for osteoporosis. Follow these instructions at home: Eating and drinking  Eat a diet that includes fresh fruits and vegetables, whole grains, lean protein, and low-fat dairy.  Take vitamin and mineral supplements as recommended by your health care provider.  Do not drink alcohol if: ? Your health care provider tells you not to drink. ? You are pregnant, may be pregnant, or are planning to become pregnant.  If you drink alcohol: ? Limit how much you have to 0-1 drink a day. ? Be aware of how much alcohol is in your drink. In the [place] one drink equals one 12 oz bottle of beer (355 mL), one 5 oz glass of wine (148 mL), or one 1 oz glass of hard liquor (44 mL). Lifestyle  Take daily care of your teeth and gums.  Stay active. Exercise for at least 30 minutes on 5 or more days each week.  Do not use any products that contain nicotine or tobacco, such as cigarettes, e-cigarettes, and chewing tobacco. If you need help quitting, ask your health care provider.  If you are sexually active, practice safe sex. Use a condom or other form of birth control (contraception) in order to prevent pregnancy and STIs (sexually transmitted infections).  If told by your health care provider, take low-dose aspirin daily starting at age 48. What's next?  Visit your health care provider once a year for a well check visit.  Ask your health care provider how often you should have your eyes and teeth checked.  Stay up to date on all vaccines. This information is not intended to replace advice given to you by your health care provider. Make sure you  discuss any questions you have with your health care provider. Document Revised: 03/09/2018 Document Reviewed: 03/09/2018 Elsevier Patient Education  2020 [place] Breast self-awareness is knowing how your breasts look and feel. Doing breast self-awareness is important. It allows you to catch a breast problem early while it is still small and can be treated. All women should do breast self-awareness, including women who have had breast implants. Tell your doctor if you notice a change in your breasts. What you need:  A mirror.  A well-lit room. How to do a breast self-exam A breast self-exam is one way to learn what is normal for your breasts and to check for changes. To do a breast self-exam: Look for changes  1. Take off all the clothes above your waist. 2. Stand in front of a mirror in a room with good lighting. 3. Put your hands on your hips. 4. Push your hands down. 5. Look at your breasts and nipples in the mirror to see if one breast or nipple looks different from the  other. Check to see if: ? The shape of one breast is different. ? The size of one breast is different. ? There are wrinkles, dips, and bumps in one breast and not the other. 6. Look at each breast for changes in the skin, such as: ? Redness. ? Scaly areas. 7. Look for changes in your nipples, such as: ? Liquid around the nipples. ? Bleeding. ? Dimpling. ? Redness. ? A change in where the nipples are. Feel for changes  1. Lie on your back on the floor. 2. Feel each breast. To do this, follow these steps: ? Pick a breast to feel. ? Put the arm closest to that breast above your head. ? Use your other arm to feel the nipple area of your breast. Feel the area with the pads of your three middle fingers by making small circles with your fingers. For the first circle, press lightly. For the second circle, press harder. For the third circle, press even harder. ? Keep making circles with  your fingers at the different pressures as you move down your breast. Stop when you feel your ribs. ? Move your fingers a little toward the center of your body. ? Start making circles with your fingers again, this time going up until you reach your collarbone. ? Keep making up-and-down circles until you reach your armpit. Remember to keep using the three pressures. ? Feel the other breast in the same way. 3. Sit or stand in the tub or shower. 4. With soapy water on your skin, feel each breast the same way you did in step 2 when you were lying on the floor. Write down what you find Writing down what you find can help you remember what to tell your doctor. Write down:  What is normal for each breast.  Any changes you find in each breast, including: ? The kind of changes you find. ? Whether you have pain. ? Size and location of any lumps.  When you last had your menstrual period. General tips  Check your breasts every month.  If you are breastfeeding, the best time to check your breasts is after you feed your baby or after you use a breast pump.  If you get menstrual periods, the best time to check your breasts is 5-7 days after your menstrual period is over.  With time, you will become comfortable with the self-exam, and you will begin to know if there are changes in your breasts. Contact a doctor if you:  See a change in the shape or size of your breasts or nipples.  See a change in the skin of your breast or nipples, such as red or scaly skin.  Have fluid coming from your nipples that is not normal.  Find a lump or thick area that was not there before.  Have pain in your breasts.  Have any concerns about your breast health. Summary  Breast self-awareness includes looking for changes in your breasts, as well as feeling for changes within your breasts.  Breast self-awareness should be done in front of a mirror in a well-lit room.  You should check your breasts every month.  If you get menstrual periods, the best time to check your breasts is 5-7 days after your menstrual period is over.  Let your doctor know of any changes you see in your breasts, including changes in size, changes on the skin, pain or tenderness, or fluid from your nipples that is not normal. This information is not  intended to replace advice given to you by your health care provider. Make sure you discuss any questions you have with your health care provider. Document Revised: 02/14/2018 Document Reviewed: 02/14/2018    Menopause and Hormone Replacement Therapy Menopause is a normal time of life when menstrual periods stop completely and the ovaries stop producing the female hormones estrogen and progesterone. This lack of hormones can affect your health and cause undesirable symptoms. Hormone replacement therapy (HRT) can relieve some of those symptoms. What is hormone replacement therapy? HRT is the use of artificial (synthetic) hormones to replace hormones that your body has stopped producing because you have reached menopause. What are my options for HRT?  HRT may consist of the synthetic hormones estrogen and progestin, or it may consist of only estrogen (estrogen-only therapy). You and your health care provider will decide which form of HRT is best for you. If you choose to be on HRT and you have a uterus, estrogen and progestin are usually prescribed. Estrogen-only therapy is used for women who do not have a uterus. Possible options for taking HRT include:  Pills.  Patches.  Gels.  Sprays.  Vaginal cream.  Vaginal rings.  Vaginal inserts. The amount of hormone(s) that you take and how long you take the hormone(s) varies according to your health. It is important to:  Begin HRT with the lowest possible dosage.  Stop HRT as soon as your health care provider tells you to stop.  Work with your health care provider so that you feel informed and comfortable with your  decisions. What are the benefits of HRT? HRT can reduce the frequency and severity of menopausal symptoms. Benefits of HRT vary according to the kind of symptoms that you have, how severe they are, and your overall health. HRT may help to improve the following symptoms of menopause:  Hot flashes and night sweats. These are sudden feelings of heat that spread over the face and body. The skin may turn red, like a blush. Night sweats are hot flashes that happen while you are sleeping or trying to sleep.  Bone loss (osteoporosis). The body loses calcium more quickly after menopause, causing the bones to become weaker. This can increase the risk for bone breaks (fractures).  Vaginal dryness. The lining of the vagina can become thin and dry, which can cause pain during sex or cause infection, burning, or itching.  Urinary tract infections.  Urinary incontinence. This is the inability to control when you pass urine.  Irritability.  Short-term memory problems. What are the risks of HRT? Risks of HRT vary depending on your individual health and medical history. Risks of HRT also depend on whether you receive both estrogen and progestin or you receive estrogen only. HRT may increase the risk of:  Spotting. This is when a small amount of blood leaks from the vagina unexpectedly.  Endometrial cancer. This cancer is in the lining of the uterus (endometrium).  Breast cancer.  Increased density of breast tissue. This can make it harder to find breast cancer on a breast X-ray (mammogram).  Stroke.  Heart disease.  Blood clots.  Gallbladder disease.  Liver disease. Risks of HRT can increase if you have any of the following conditions:  Endometrial cancer.  Liver disease.  Heart disease.  Breast cancer.  History of blood clots.  History of stroke. Follow these instructions at home:  Take over-the-counter and prescription medicines only as told by your health care provider.  Get  mammograms, pelvic exams, and  medical checkups as often as told by your health care provider.  Have Pap tests done as often as told by your health care provider. A Pap test is sometimes called a Pap smear. It is a screening test that is used to check for signs of cancer of the cervix and vagina. A Pap test can also identify the presence of infection or precancerous changes. Pap tests may be done: ? Every 3 years, starting at age 63. ? Every 5 years, starting after age 70, in combination with testing for human papillomavirus (HPV). ? More often or less often depending on other medical conditions you have, your age, and other risk factors.  It is up to you to get the results of your Pap test. Ask your health care provider, or the department that is doing the test, when your results will be ready.  Keep all follow-up visits as told by your health care provider. This is important. Contact a health care provider if you have:  Pain or swelling in your legs.  Shortness of breath.  Chest pain.  Lumps or changes in your breasts or armpits.  Slurred speech.  Pain, burning, or bleeding when you urinate.  Unusual vaginal bleeding.  Dizziness or headaches.  Weakness or numbness in any part of your arms or legs.  Pain in your abdomen. Summary  Menopause is a normal time of life when menstrual periods stop completely and the ovaries stop producing the female hormones estrogen and progesterone.  Hormone replacement therapy (HRT) can relieve some of the symptoms of menopause.  HRT can reduce the frequency and severity of menopausal symptoms.  Risks of HRT vary depending on your individual health and medical history. This information is not intended to replace advice given to you by your health care provider. Make sure you discuss any questions you have with your health care provider. Document Revised: 02/28/2018 Document Reviewed: 02/28/2018 Elsevier Patient Education  2020 [place] Patient Education  [employer].

## 2020-03-12 ENCOUNTER — Encounter: Payer: Self-pay | Admitting: Family Medicine

## 2020-03-12 ENCOUNTER — Telehealth (INDEPENDENT_AMBULATORY_CARE_PROVIDER_SITE_OTHER): Payer: 59 | Admitting: Family Medicine

## 2020-03-12 VITALS — Ht 65.0 in | Wt 198.0 lb

## 2020-03-12 DIAGNOSIS — Z6833 Body mass index (BMI) 33.0-33.9, adult: Secondary | ICD-10-CM

## 2020-03-12 DIAGNOSIS — E6609 Other obesity due to excess calories: Secondary | ICD-10-CM | POA: Diagnosis not present

## 2020-03-12 DIAGNOSIS — R05 Cough: Secondary | ICD-10-CM | POA: Diagnosis not present

## 2020-03-12 DIAGNOSIS — R0981 Nasal congestion: Secondary | ICD-10-CM

## 2020-03-12 DIAGNOSIS — Z1239 Encounter for other screening for malignant neoplasm of breast: Secondary | ICD-10-CM | POA: Diagnosis not present

## 2020-03-12 DIAGNOSIS — E7849 Other hyperlipidemia: Secondary | ICD-10-CM

## 2020-03-12 DIAGNOSIS — I10 Essential (primary) hypertension: Secondary | ICD-10-CM | POA: Diagnosis not present

## 2020-03-12 DIAGNOSIS — R059 Cough, unspecified: Secondary | ICD-10-CM

## 2020-03-12 DIAGNOSIS — E559 Vitamin D deficiency, unspecified: Secondary | ICD-10-CM | POA: Diagnosis not present

## 2020-03-12 MED ORDER — CHLORPHENIRAMINE MALEATE 4 MG PO TABS: 4.0000 mg | ORAL_TABLET | Freq: Two times a day (BID) | ORAL | 0 refills | Status: DC | PRN

## 2020-03-12 MED ORDER — PSEUDOEPH-BROMPHEN-DM 30-2-10 MG/5ML PO SYRP: ORAL_SOLUTION | ORAL | 0 refills | Status: DC

## 2020-03-12 NOTE — Progress Notes (Signed)
Virtual Visit via Telephone Note  I connected with Kristy Travis on 03/12/20 at  3:40 PM EDT by telephone and verified that I am speaking with the correct person using two identifiers.  Location: Patient: home Provider: office   I discussed the limitations, risks, security and privacy concerns of performing an evaluation and management service by telephone and the availability of in person appointments. I also discussed with the patient that there may be a patient responsible charge related to this service. The patient expressed understanding and agreed to proceed.   History of Present Illness: 3 day h/o head congestion drainage , no fever or chills, no body ache, earache or sore throat. 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 Alert and oriented x 3 No signs of respiratory distress during speech    Assessment and Plan: Cough New onset cough, with head congestion, removed from work until cleared of having covid. Encouraged rest, liquids and self isolatio at home pending result Decongestant prescribed  Sinus congestion Symptomatic treatment, decongestant and saline flush  Obesity  Patient re-educated about  the importance of commitment to a  minimum of 150 minutes of exercise per week as able.  The importance of healthy food choices with portion control discussed, as well as eating regularly and within a 12 hour window most days. The need to choose "clean , green" food 50 to 75% of the time is discussed, as well as to make water the primary drink and set a goal of 64 ounces water daily.     Weight /BMI 03/12/2020 03/11/2020 01/17/2020  WEIGHT 198 lb 198 lb 12.8 oz 200 lb 12.8 oz  HEIGHT 5\' 5"  5\' 5"  5\' 5"   BMI 32.95 kg/m2 33.08 kg/m2 33.41 kg/m2        Follow Up Instructions:    I discussed the assessment and treatment plan with the patient. The patient was provided an opportunity to ask questions and all were answered. The patient agreed with the plan and demonstrated an understanding of the instructions.   The patient was advised to call back or seek an in-person evaluation if the symptoms worsen or if the condition fails to improve as anticipated.  I provided 15 minutes of non-face-to-face time during this encounter.   03/19/2020, MD

## 2020-03-12 NOTE — Patient Instructions (Addendum)
F/U in office with MD end January,  call if you need me sooner  Decongestant and cough medication are prescribed as discussed  Remain in quarantine until; you get Covid results, health at work will follow up with you on this  Please schedule your mammogram, this is overdue  Fasting lipid, cmp and EGFR and vit  5 days before January appointment  If you develop fever, chills, shortness of breath or difficulty breathing please go to the nearest urgent care or ED  Thanks for choosing [facility], we consider it a privelige to serve you.

## 2020-03-16 ENCOUNTER — Encounter: Payer: Self-pay | Admitting: Family Medicine

## 2020-03-16 DIAGNOSIS — R0981 Nasal congestion: Secondary | ICD-10-CM | POA: Insufficient documentation

## 2020-03-16 DIAGNOSIS — R059 Cough, unspecified: Secondary | ICD-10-CM | POA: Insufficient documentation

## 2020-03-16 NOTE — Assessment & Plan Note (Signed)
New onset cough, with head congestion, removed from work until cleared of having covid. Encouraged rest, liquids and self isolatio at home pending result Decongestant prescribed

## 2020-03-16 NOTE — Assessment & Plan Note (Signed)
  Patient re-educated about  the importance of commitment to a  minimum of 150 minutes of exercise per week as able.  The importance of healthy food choices with portion control discussed, as well as eating regularly and within a 12 hour window most days. The need to choose "clean , green" food 50 to 75% of the time is discussed, as well as to make water the primary drink and set a goal of 64 ounces water daily.    Weight /BMI 03/12/2020 03/11/2020 01/17/2020  WEIGHT 198 lb 198 lb 12.8 oz 200 lb 12.8 oz  HEIGHT 5\' 5"  5\' 5"  5\' 5"   BMI 32.95 kg/m2 33.08 kg/m2 33.41 kg/m2

## 2020-03-16 NOTE — Assessment & Plan Note (Signed)
Symptomatic treatment, decongestant and saline flush

## 2020-03-25 ENCOUNTER — Telehealth: Payer: Self-pay

## 2020-03-25 NOTE — Telephone Encounter (Signed)
Patient was upset and said she wasn't going to the urgent care because all she was a prn inhaler and refill of the cough med. She said we can order a chest xray if its needed. I told her that dr simpson would be back tomorrow after lunch and I would call her back then

## 2020-03-25 NOTE — Telephone Encounter (Signed)
Given her ongoing symptoms.  And the fact that she would need a refill and an inhaler she would benefit from a appointment.  She can either do a virtual appointment through [facility] or virtual visit with [place].  Or she can go to the nearest urgent care.  Secondary to this shortness of breath post Covid this can linger and last for a long period of time.  However if she is having a lot of coughing or shortness of breath a chest x-ray might be prudent as there can be some changes in some people which could cause them to have ongoing respiratory issues.

## 2020-03-25 NOTE — Telephone Encounter (Signed)
Pt had covid on 9/1 and now back at work [employer]) but still has a lingering cough and when shes at work moving around with the mask on she is getting short winded. I told if she had SOB she needed evaluation but she said it wasn't sob "like that" but its been consistent since she had covid that when she gets up moving around too much with the mask on she is short winded and that dr simpson prescribed cough syrup and she was wanting it refilled and an inhaler to use prn. Do you recommend anything or want me to defer to dr simpson since she will be back tomorrow pm?

## 2020-03-26 ENCOUNTER — Other Ambulatory Visit: Payer: Self-pay | Admitting: Family Medicine

## 2020-03-26 MED ORDER — PROMETHAZINE-DM 6.25-15 MG/5ML PO SYRP: ORAL_SOLUTION | ORAL | 1 refills | Status: DC

## 2020-03-26 MED ORDER — ALBUTEROL SULFATE HFA 108 (90 BASE) MCG/ACT IN AERS: 2.0000 | INHALATION_SPRAY | Freq: Four times a day (QID) | RESPIRATORY_TRACT | 0 refills | Status: DC | PRN

## 2020-03-26 NOTE — Telephone Encounter (Signed)
Pt is frustrated and wants refill of cough med and inhaler for prn use and chest xray if you think its needed but she made it clear she did not want to go to urgent care

## 2020-03-26 NOTE — Telephone Encounter (Signed)
Pls advise her phenergan dm and albuterol are prescribed, if symptoms of cough, shortness of breath or fever , chills, sputum present, then needs clinical evaluation

## 2020-03-26 NOTE — Telephone Encounter (Signed)
The cough is just dry but she wants to know if you think she needs a chest xray or only if she develops increased SOB or sputum?

## 2020-03-27 NOTE — Telephone Encounter (Signed)
Message left for patient

## 2020-03-27 NOTE — Telephone Encounter (Signed)
No cxr now, will need it if worsening symptoms at which time she needs clinical eval

## 2020-04-11 ENCOUNTER — Encounter: Payer: 59 | Admitting: Obstetrics and Gynecology

## 2020-04-17 ENCOUNTER — Encounter: Payer: Self-pay | Admitting: Obstetrics and Gynecology

## 2020-04-17 ENCOUNTER — Telehealth: Payer: Self-pay

## 2020-04-17 ENCOUNTER — Other Ambulatory Visit: Payer: Self-pay | Admitting: Obstetrics and Gynecology

## 2020-04-17 ENCOUNTER — Other Ambulatory Visit: Payer: Self-pay

## 2020-04-17 ENCOUNTER — Ambulatory Visit (INDEPENDENT_AMBULATORY_CARE_PROVIDER_SITE_OTHER): Payer: 59 | Admitting: Obstetrics and Gynecology

## 2020-04-17 VITALS — Ht 65.0 in | Wt 196.0 lb

## 2020-04-17 DIAGNOSIS — G479 Sleep disorder, unspecified: Secondary | ICD-10-CM

## 2020-04-17 DIAGNOSIS — Z1211 Encounter for screening for malignant neoplasm of colon: Secondary | ICD-10-CM | POA: Diagnosis not present

## 2020-04-17 DIAGNOSIS — N951 Menopausal and female climacteric states: Secondary | ICD-10-CM | POA: Diagnosis not present

## 2020-04-17 MED ORDER — ESZOPICLONE 1 MG PO TABS: 1.0000 mg | ORAL_TABLET | Freq: Every evening | ORAL | 0 refills | Status: DC | PRN

## 2020-04-17 MED ORDER — ESTROGENS CONJUGATED 0.9 MG PO TABS: 0.9000 mg | ORAL_TABLET | Freq: Every day | ORAL | 0 refills | Status: DC

## 2020-04-17 NOTE — Progress Notes (Signed)
Televisit-Pt having follow up visit for hot flashes, night sweats and unable to sleep at night.

## 2020-04-17 NOTE — Progress Notes (Signed)
Virtual Visit via Telephone Note  I connected with Kristy Travis on 04/17/20 at  1:45 PM EDT by telephone and verified that I am speaking with the correct person using two identifiers.   I discussed the limitations, risks, security and privacy concerns of performing an evaluation and management service by telephone and the availability of in person appointments. I also discussed with the patient that there may be a patient responsible charge related to this service. The patient expressed understanding and agreed to proceed.  Patient Location: Work Dispensing optician: Office   History of Present Illness: Kristy Travis is a 54 y.o. [phone] female who presents for 1 month follow up of menopausal vasomotor symptoms.  She was previously given a trial of Premairn 0.3 and 0.5 mg doses.  Patient notes that she really did not feel much difference when taking the medications. Still having significant hot flashes, and "mental fog". She is also having difficulty sleeping, has tried essential oils, melatonin (caused headaches), trazadone (GI upset) and sleep teas.    She also desires referral to be placed for her to schedule her colonoscopy.      Observations/Objective: Height 5\' 5"  (1.651 m), weight 196 lb (88.9 kg), last menstrual period 06/24/2014. Gen App: patient pleasant sounding, no acute distress.   Assessment and Plan: 1. Menopausal vasomotor syndrome - Patient with persistent symptoms with low doses of trials of Premarin. Will prescribe 0.625 mg tablets. If improvement noted in symptoms, can also consider alternative use of patch if desired.  2. Sleep disturbances - patient has tried multiple interventionsto help with sleep with no improvement. Discussed that it could be related to her hormones, or could be other unidentified cause. Offered trial of Lunesta prn to help with sleep. If caused by hormones due to menopause, should resolve with HRT. 3. Colon cancer screening -  patient desires referral, will place.     Follow Up Instructions: Follow up in 1 month for reassessment of symptoms.    I discussed the assessment and treatment plan with the patient. The patient was provided an opportunity to ask questions and all were answered. The patient agreed with the plan and demonstrated an understanding of the instructions.   The patient was advised to call back or seek an in-person evaluation if the symptoms worsen or if the condition fails to improve as anticipated.  I provided 8 minutes of non-face-to-face time during this encounter.   06/26/2014, MD

## 2020-04-17 NOTE — Telephone Encounter (Signed)
Called patient to get her checked in for her televisit, phone call went straight to voicemail. LVM for her to return call.

## 2020-04-18 ENCOUNTER — Ambulatory Visit (INDEPENDENT_AMBULATORY_CARE_PROVIDER_SITE_OTHER): Payer: 59 | Admitting: Obstetrics and Gynecology

## 2020-04-18 VITALS — BP 124/81 | HR 78 | Ht 65.0 in | Wt 196.8 lb

## 2020-04-18 DIAGNOSIS — R399 Unspecified symptoms and signs involving the genitourinary system: Secondary | ICD-10-CM | POA: Diagnosis not present

## 2020-04-18 LAB — POCT URINALYSIS DIPSTICK
Bilirubin, UA: NEGATIVE
Blood, UA: NEGATIVE
Glucose, UA: NEGATIVE
Ketones, UA: NEGATIVE
Nitrite, UA: NEGATIVE
Protein, UA: NEGATIVE
Spec Grav, UA: 1.015 (ref 1.010–1.025)
Urobilinogen, UA: 0.2 E.U./dL
pH, UA: 6 (ref 5.0–8.0)

## 2020-04-18 NOTE — Progress Notes (Signed)
Pt had televisit yesterday and stated that she was having UTI symptoms. Pt present today for urine check for UTI. UA and cultures completed.

## 2020-04-20 ENCOUNTER — Encounter: Payer: Self-pay | Admitting: Obstetrics and Gynecology

## 2020-04-20 LAB — URINE CULTURE

## 2020-05-12 ENCOUNTER — Ambulatory Visit
Admission: RE | Admit: 2020-05-12 | Discharge: 2020-05-12 | Disposition: A | Discharge status: Home / Self Care | Payer: 59 | Source: Ambulatory Visit | Attending: Family Medicine | Admitting: Family Medicine

## 2020-05-12 ENCOUNTER — Other Ambulatory Visit: Payer: Self-pay

## 2020-05-12 DIAGNOSIS — Z1231 Encounter for screening mammogram for malignant neoplasm of breast: Secondary | ICD-10-CM | POA: Diagnosis not present

## 2020-05-12 DIAGNOSIS — Z1239 Encounter for other screening for malignant neoplasm of breast: Secondary | ICD-10-CM

## 2020-05-15 ENCOUNTER — Ambulatory Visit (INDEPENDENT_AMBULATORY_CARE_PROVIDER_SITE_OTHER): Payer: 59 | Admitting: Obstetrics and Gynecology

## 2020-05-15 ENCOUNTER — Other Ambulatory Visit: Payer: Self-pay

## 2020-05-15 ENCOUNTER — Telehealth: Payer: Self-pay

## 2020-05-15 ENCOUNTER — Encounter: Payer: Self-pay | Admitting: Obstetrics and Gynecology

## 2020-05-15 VITALS — Ht 65.0 in | Wt 195.0 lb

## 2020-05-15 DIAGNOSIS — N951 Menopausal and female climacteric states: Secondary | ICD-10-CM | POA: Diagnosis not present

## 2020-05-15 DIAGNOSIS — G479 Sleep disorder, unspecified: Secondary | ICD-10-CM

## 2020-05-15 NOTE — Progress Notes (Signed)
Virtual Visit via Telephone Note  I connected with Kristy Travis on 05/15/20 at  4:30 PM EDT by telephone and verified that I am speaking with the correct person using two identifiers.  Location: Patient: Work Provider: Office   I discussed the limitations, risks, security and privacy concerns of performing an evaluation and management service by telephone and the availability of in person appointments. I also discussed with the patient that there may be a patient responsible charge related to this service. The patient expressed understanding and agreed to proceed.   History of Present Illness: Kristy Travis is a 54 y.o. [phone] female who presents for televisit for follow up of her menopausal vasomotor symptoms and sleep disturbances. Her dosage of Premarin was increased from 0.625 mg to 0.9 mg to better manage her symptoms. She notes that the hot flushes have improved some, but do still occur. She does endorse some breast tenderness.    With regards to her sleep, she states that she does sometimes still have difficulty sleeping. She does not take the Lunesta every night, but just occasionally.    Observations/Objective: Height 5\' 5"  (1.651 m), weight 195 lb (88.5 kg), last menstrual period 06/24/2014. Vitals self-reported by patient.   Gen App: alert, no acute distress.   Assessment and Plan: 1. Menopausal vasomotor syndrome - continue Premarin for now, can perform 21/7 day regimen to help with relief of breast symptoms instead of continuous daily dosing. If symptoms still do not improve, can decrease dosing and add alternative non-hormonal medication (I.e. clonidine) 2. Sleep disturbances - patient has 06/26/2014, is sparingly using the medication (notes fear of dependence on sleep aids). Advised that she could increase use of the medication to 2-3 times per week as needed.     Follow Up Instructions: Follow up as needed, or if symptoms fail to improve or worsen.     I discussed the assessment and treatment plan with the patient. The patient was provided an opportunity to ask questions and all were answered. The patient agreed with the plan and demonstrated an understanding of the instructions.   The patient was advised to call back or seek an in-person evaluation if the symptoms worsen or if the condition fails to improve as anticipated.  I provided 7 minutes of non-face-to-face time during this encounter.   Alfonso Patten, MD  [facility]

## 2020-05-15 NOTE — Progress Notes (Signed)
Televisit-Pt having televisit for follow up for hot flashes. Pt stated having hot flashes and night sweats.

## 2020-05-16 NOTE — Telephone Encounter (Signed)
Error

## 2020-06-24 ENCOUNTER — Other Ambulatory Visit: Payer: Self-pay | Admitting: Podiatry

## 2020-06-24 DIAGNOSIS — M7672 Peroneal tendinitis, left leg: Secondary | ICD-10-CM | POA: Diagnosis not present

## 2020-06-24 DIAGNOSIS — M79672 Pain in left foot: Secondary | ICD-10-CM | POA: Diagnosis not present

## 2020-06-24 DIAGNOSIS — M216X1 Other acquired deformities of right foot: Secondary | ICD-10-CM | POA: Diagnosis not present

## 2020-06-24 DIAGNOSIS — M79671 Pain in right foot: Secondary | ICD-10-CM | POA: Diagnosis not present

## 2020-07-15 ENCOUNTER — Encounter: Payer: Self-pay | Admitting: Obstetrics and Gynecology

## 2020-07-21 ENCOUNTER — Ambulatory Visit: Payer: 59 | Admitting: Family Medicine

## 2020-08-04 ENCOUNTER — Ambulatory Visit: Payer: 59 | Admitting: Family Medicine

## 2020-08-26 ENCOUNTER — Encounter: Payer: Self-pay | Admitting: *Deleted

## 2020-08-26 ENCOUNTER — Other Ambulatory Visit: Payer: Self-pay

## 2020-08-26 ENCOUNTER — Ambulatory Visit (INDEPENDENT_AMBULATORY_CARE_PROVIDER_SITE_OTHER): Payer: 59

## 2020-08-26 ENCOUNTER — Other Ambulatory Visit: Payer: Self-pay | Admitting: Podiatry

## 2020-08-26 ENCOUNTER — Ambulatory Visit (INDEPENDENT_AMBULATORY_CARE_PROVIDER_SITE_OTHER): Payer: 59 | Admitting: Podiatry

## 2020-08-26 DIAGNOSIS — M65872 Other synovitis and tenosynovitis, left ankle and foot: Secondary | ICD-10-CM

## 2020-08-26 DIAGNOSIS — M25472 Effusion, left ankle: Secondary | ICD-10-CM

## 2020-08-26 DIAGNOSIS — S9032XA Contusion of left foot, initial encounter: Secondary | ICD-10-CM | POA: Diagnosis not present

## 2020-08-26 DIAGNOSIS — M659 Synovitis and tenosynovitis, unspecified: Secondary | ICD-10-CM

## 2020-08-26 MED ORDER — BETAMETHASONE SOD PHOS & ACET 6 (3-3) MG/ML IJ SUSP: 3.0000 mg | Freq: Once | INTRAMUSCULAR | Status: DC

## 2020-08-26 MED ORDER — METHYLPREDNISOLONE 4 MG PO TBPK: ORAL_TABLET | ORAL | 0 refills | Status: DC

## 2020-08-26 MED ORDER — MELOXICAM 15 MG PO TABS: 15.0000 mg | ORAL_TABLET | Freq: Every day | ORAL | 1 refills | Status: DC

## 2020-08-26 NOTE — Progress Notes (Signed)
   Subjective:  55 y.o. female presenting today as a new patient for evaluation of pain and tenderness to the left ankle is been going on for approximately 3 months now.  She has noticed swelling to the ankle.  She was seen by another physician at [facility] clinic where she was provided an ankle brace and anti-inflammatory medication.  She states that there is minimal improvement.  She denies a history of injury.  She presents for further treatment evaluation   Past Medical History:  Diagnosis Date  . Arthritis   . BACK PAIN, LUMBAR 01/15/2008   Qualifier: Diagnosis of  By: Lodema Hong MD, Claris Che    . Carpal tunnel syndrome   . Chronic fatigue   . GERD (gastroesophageal reflux disease)   . GERD (gastroesophageal reflux disease) 04/06/2017  . Glucose intolerance (impaired glucose tolerance) 12/12/2017  . Hypertension    per patient this was prior to weight loss surgery/cn 12/01/17  . Kidney stone on right side 12/21/2016  . Metabolic syndrome X 09/02/2010   Qualifier: Diagnosis of  By: Lillia Mountain LPN, Brandi    . Need for shingles vaccine 12/11/2018  . Obesity   . Pancreatitis 07/2014   had  gall bladder removed , spent 1 week in hospital  . PONV (postoperative nausea and vomiting)   . Seasonal allergies 04/04/2012     Objective / Physical Exam:  General:  The patient is alert and oriented x3 in no acute distress. Dermatology:  Skin is warm, dry and supple bilateral lower extremities. Negative for open lesions or macerations. Vascular:  Palpable pedal pulses bilaterally. No edema or erythema noted. Capillary refill within normal limits. Neurological:  Epicritic and protective threshold grossly intact bilaterally.  Musculoskeletal Exam:  Pain on palpation to the anterior lateral medial aspects of the patient's left ankle. Mild edema noted. Range of motion within normal limits to all pedal and ankle joints bilateral. Muscle strength 5/5 in all groups bilateral.  On physical exam there is also an  increased medial longitudinal arch of the foot consistent with a cavus foot type bilateral.  Excessive lateral column loading with pressure on the forefoot also noted.  Radiographic Exam:  Normal osseous mineralization. Joint spaces preserved. No fracture/dislocation/boney destruction.    Assessment: 1.  Left ankle pain/synovitis 2.  Cavus foot type bilateral  Plan of Care:  1. Patient was evaluated. X-Rays reviewed.  2. Injection of 0.5 mL Celestone Soluspan injected in the patient's left ankle. 3.  Prescription for Medrol Dosepak 4.  Prescription for meloxicam 15 mg daily after completion of the Dosepak 5.  Cam boot dispensed.  Weightbearing as tolerated x3 weeks 6.  Appointment with Pedorthist for custom molded orthotics 7.  Return to clinic in 3 weeks  *[facility] at [facility]   Felecia Shelling, DPM [facility]  Dr. Felecia Shelling, DPM    [address] [place], [place] [address]                Office [phone]  Fax [phone]

## 2020-09-16 ENCOUNTER — Ambulatory Visit (INDEPENDENT_AMBULATORY_CARE_PROVIDER_SITE_OTHER): Payer: 59 | Admitting: Podiatry

## 2020-09-16 ENCOUNTER — Other Ambulatory Visit: Payer: Self-pay

## 2020-09-16 DIAGNOSIS — M659 Synovitis and tenosynovitis, unspecified: Secondary | ICD-10-CM

## 2020-09-16 MED ORDER — BETAMETHASONE SOD PHOS & ACET 6 (3-3) MG/ML IJ SUSP
3.0000 mg | Freq: Once | INTRAMUSCULAR | Status: AC
  Administered 2020-09-16: 3 mg via INTRA_ARTICULAR

## 2020-09-16 NOTE — Progress Notes (Signed)
° °  Subjective:  55 y.o. female presenting today for follow-up evaluation of pain and tenderness to the left ankle is been going on for approximately 4 months now.  She has noticed swelling to the ankle.  She was seen by another physician at [facility] clinic where she was provided an ankle brace and anti-inflammatory medication.  Patient got minimal improvement with the ankle brace.  Last visit she received a steroid injection and placed in the cam boot.  Patient states that overall she is feeling much better.  She presents for further treatment and evaluation  Past Medical History:  Diagnosis Date   Arthritis    BACK PAIN, LUMBAR 01/15/2008   Qualifier: Diagnosis of  By: Lodema Hong MD, Margaret     Carpal tunnel syndrome    Chronic fatigue    GERD (gastroesophageal reflux disease)    GERD (gastroesophageal reflux disease) 04/06/2017   Glucose intolerance (impaired glucose tolerance) 12/12/2017   Hypertension    per patient this was prior to weight loss surgery/cn 12/01/17   Kidney stone on right side 12/21/2016   Metabolic syndrome X 09/02/2010   Qualifier: Diagnosis of  By: Lillia Mountain LPN, Brandi     Need for shingles vaccine 12/11/2018   Obesity    Pancreatitis 07/2014   had  gall bladder removed , spent 1 week in hospital   PONV (postoperative nausea and vomiting)    Seasonal allergies 04/04/2012     Objective / Physical Exam:  General:  The patient is alert and oriented x3 in no acute distress. Dermatology:  Skin is warm, dry and supple bilateral lower extremities. Negative for open lesions or macerations. Vascular:  Palpable pedal pulses bilaterally. No edema or erythema noted. Capillary refill within normal limits. Neurological:  Epicritic and protective threshold grossly intact bilaterally.  Musculoskeletal Exam:  Pain on palpation to the anterior lateral medial aspects of the patient's left ankle. Mild edema noted. Range of motion within normal limits to all pedal and ankle  joints bilateral. Muscle strength 5/5 in all groups bilateral.  On physical exam there is also an increased medial longitudinal arch of the foot consistent with a cavus foot type bilateral.  Excessive lateral column loading with pressure on the forefoot also noted.  Assessment: 1.  Left ankle pain/synovitis 2.  Cavus foot type bilateral  Plan of Care:  1. Patient was evaluated.  2. Injection of 0.5 mL Celestone Soluspan injected in the patient's left ankle. 3.  Custom molded orthotics pending.  Patient states that she has an appointment next week with the Pedorthist 4.  Continue weightbearing in the cam boot.  Note for work was provided today 5.  Continue meloxicam 15 mg daily  6.  Return to clinic in 4 weeks  *[facility] at [facility]   Felecia Shelling, DPM [facility]  Dr. Felecia Shelling, DPM    [address] [place], [place] [address]                Office [phone]  Fax [phone]

## 2020-09-24 ENCOUNTER — Other Ambulatory Visit: Payer: Self-pay

## 2020-09-24 ENCOUNTER — Ambulatory Visit (INDEPENDENT_AMBULATORY_CARE_PROVIDER_SITE_OTHER): Payer: 59 | Admitting: Podiatry

## 2020-09-24 DIAGNOSIS — M659 Synovitis and tenosynovitis, unspecified: Secondary | ICD-10-CM

## 2020-09-24 NOTE — Progress Notes (Signed)
Patient presents to be casted for orthotics.  A foam impression was casted for her right and left foot.  Patient is a size 9 1/2 to 10 with a high arch foot.  Patient will be contacted when the orthotics are ready for pick up.

## 2020-10-14 ENCOUNTER — Ambulatory Visit: Payer: 59 | Admitting: Podiatry

## 2020-10-17 ENCOUNTER — Telehealth: Payer: Self-pay

## 2020-10-17 NOTE — Telephone Encounter (Signed)
Pt presented to office to pick up orthotics. Instructions were given to pt and she stated that she would call back if adjustments needed to be made.

## 2020-11-05 ENCOUNTER — Other Ambulatory Visit: Payer: Self-pay

## 2020-11-05 MED FILL — Meloxicam Tab 15 MG: ORAL | 30 days supply | Qty: 30 | Fill #0 | Status: AC

## 2020-11-26 ENCOUNTER — Other Ambulatory Visit: Payer: Self-pay

## 2020-11-26 ENCOUNTER — Other Ambulatory Visit: Payer: Self-pay | Admitting: Family Medicine

## 2020-11-26 DIAGNOSIS — K219 Gastro-esophageal reflux disease without esophagitis: Secondary | ICD-10-CM

## 2020-11-26 MED ORDER — OMEPRAZOLE 20 MG PO CPDR
DELAYED_RELEASE_CAPSULE | ORAL | 1 refills | Status: DC
  Filled 2020-11-26: qty 60, 30d supply, fill #0

## 2020-11-27 ENCOUNTER — Encounter: Payer: Self-pay | Admitting: Emergency Medicine

## 2020-11-27 ENCOUNTER — Other Ambulatory Visit: Payer: Self-pay

## 2020-11-27 ENCOUNTER — Ambulatory Visit
Admission: EM | Admit: 2020-11-27 | Discharge: 2020-11-27 | Disposition: A | Discharge status: Home / Self Care | Payer: 59 | Attending: Emergency Medicine | Admitting: Emergency Medicine

## 2020-11-27 DIAGNOSIS — J069 Acute upper respiratory infection, unspecified: Secondary | ICD-10-CM | POA: Diagnosis not present

## 2020-11-27 LAB — POCT RAPID STREP A (OFFICE): Rapid Strep A Screen: NEGATIVE

## 2020-11-27 NOTE — ED Triage Notes (Signed)
Patient c/o sore throat and bilateral ear pain that started this morning.   Patient states " It feels like my ears are full".   Patient denies fever, SOB, Dizziness or Chest Pain.   Patient has COVID-19 test done at [facility] with pending result per patient statement.   Patient has taken Tylenol, Warm salt gargles, and lozenges with no relief of symptoms.

## 2020-11-27 NOTE — ED Provider Notes (Signed)
Renaldo Fiddler    CSN: [id number] Arrival date & time: 11/27/20  1527      History   Chief Complaint Chief Complaint  Patient presents with  . Sore Throat  . Otalgia    HPI Kristy Travis is a 55 y.o. female.   Patient presents with runny nose, earache, and sore throat since this morning.  She denies fever, rash, cough, shortness of breath, vomiting, diarrhea, or other symptoms.  Treatment attempted at home with Tylenol.  Patient had a COVID test done through health at work today and results pending.  Her medical history includes chronic fatigue, pancreatitis, GERD, hypertension, seasonal allergies, arthritis, obesity, back pain.  The history is provided by the patient and medical records.    Past Medical History:  Diagnosis Date  . Arthritis   . BACK PAIN, LUMBAR 01/15/2008   Qualifier: Diagnosis of  By: Lodema Hong MD, Claris Che    . Carpal tunnel syndrome   . Chronic fatigue   . GERD (gastroesophageal reflux disease)   . GERD (gastroesophageal reflux disease) 04/06/2017  . Glucose intolerance (impaired glucose tolerance) 12/12/2017  . Hypertension    per patient this was prior to weight loss surgery/cn 12/01/17  . Kidney stone on right side 12/21/2016  . Metabolic syndrome X 09/02/2010   Qualifier: Diagnosis of  By: Lillia Mountain LPN, Brandi    . Need for shingles vaccine 12/11/2018  . Obesity   . Pancreatitis 07/2014   had  gall bladder removed , spent 1 week in hospital  . PONV (postoperative nausea and vomiting)   . Seasonal allergies 04/04/2012    Patient Active Problem List   Diagnosis Date Noted  . Cough 03/16/2020  . Sinus congestion 03/16/2020  . Annual visit for general adult medical examination with abnormal findings 01/17/2020  . Diabetes mellitus screening 01/17/2020  . Screening mammogram, encounter for 01/17/2020  . Encounter for screening for malignant neoplasm of colon 01/17/2020  . Mass of soft tissue of chest 01/17/2020  . Primary osteoarthritis of  left knee 12/12/2017  . Patellofemoral stress syndrome 12/12/2017  . GERD (gastroesophageal reflux disease) 04/06/2017  . Chronic fatigue disorder 12/22/2016  . Vitamin D deficiency 08/27/2011  . Hyperlipidemia 02/06/2010  . Obesity 12/21/2007  . Essential hypertension 12/21/2007    Past Surgical History:  Procedure Laterality Date  . ABDOMINAL HYSTERECTOMY    . 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 CHOLECYSTECTOMY  07/2014  . cyst removed from left foot and bone removed from left foot  April and June 2010  . DILATION AND CURETTAGE OF UTERUS    . ESOPHAGOGASTRODUODENOSCOPY N/A 03/03/2016   Procedure: ESOPHAGOGASTRODUODENOSCOPY (EGD);  Surgeon: Malissa Hippo, MD;  Location: [facility];  Service: Endoscopy;  Laterality: N/A;  3:00 - moved to 2:00 - Ann to notify pt  . KNEE ARTHROSCOPY Right 12/12/2017   Procedure: ARTHROSCOPY KNEE, MEDIAL CHONDROPLASTY, LOOSE BODY REMOVAL,PARTIAL LATERAL MENISCECTOMY;  Surgeon: Donato Heinz, MD;  Location: [facility] ORS;  Service: Orthopedics;  Laterality: Right;  . KNEE ARTHROSCOPY Left 06/02/2018   Procedure: ARTHROSCOPY KNEE, MEDIAL CHONDROPLASTY, PARTIAL MEDIAL MENISCECTOMY;  Surgeon: Donato Heinz, MD;  Location: [facility] ORS;  Service: Orthopedics;  Laterality: Left;  . laparoscopy to eval dyspepsia  2004  . TOTAL VAGINAL HYSTERECTOMY  06/2014   with BSO    OB History    Gravida  3   Para  2   Term  2   Preterm      AB  1  Living  2     SAB  1   IAB      Ectopic      Multiple      Live Births               Home Medications    Prior to Admission medications   Medication Sig Start Date End Date Taking? Authorizing Provider  meloxicam (MOBIC) 15 MG tablet TAKE 1 TABLET BY MOUTH ONCE DAILY 08/26/20 08/26/21 Yes Felecia Shelling, DPM  omeprazole (PRILOSEC) 20 MG capsule TAKE 1 CAPSULE BY MOUTH TWICE DAILY BEFORE A MEAL. 11/26/20 11/26/21 Yes Kerri Perches, MD  albuterol (VENTOLIN HFA) 108 (90 Base) MCG/ACT inhaler  Inhale 2 puffs into the lungs every 6 (six) hours as needed for wheezing or shortness of breath. 03/26/20   Kerri Perches, MD  chlorpheniramine (CHLOR-TRIMETON) 4 MG tablet Take 1 tablet (4 mg total) by mouth 2 (two) times daily as needed for allergies. 03/12/20   Kerri Perches, MD  diclofenac sodium (VOLTAREN) 1 % GEL Apply 2 g topically 4 (four) times daily. For knees    [provider]  estrogens, conjugated, (PREMARIN) 0.9 MG tablet TAKE 1 TABLET (0.9 MG TOTAL) BY MOUTH DAILY FOR 21 DAYS THEN DO NOT TAKE FOR 7 DAYS EACH MONTH. 04/17/20 04/17/21  Hildred Laser, MD  eszopiclone (LUNESTA) 1 MG TABS tablet TAKE 1 TABLET (1 MG TOTAL) BY MOUTH AT BEDTIME AS NEEDED FOR SLEEP. TAKE IMMEDIATELY BEFORE BEDTIME 04/17/20 10/14/20  Hildred Laser, MD  methylPREDNISolone (MEDROL DOSEPAK) 4 MG TBPK tablet TAKE 6 TABS BY MOUTH ON DAY 1; 5 TABS ON DAY 2; 4 TABS ON DAY 3; 3 TABS ON DAY 4; 2 TABS ON DAY 5; 1 TAB ON DAY 6 THEN STOP 08/26/20 08/26/21  Felecia Shelling, DPM  methylPREDNISolone (MEDROL DOSEPAK) 4 MG TBPK tablet TAKE ALL 6 TABLETS BY MOUTH ON DAY 1, THEN DECREASE BY ONE TABLET EACH DAY (6-5-4-3-2-1) 06/24/20 06/24/21  Rosetta Posner, DPM  promethazine-dextromethorphan (PROMETHAZINE-DM) 6.25-15 MG/5ML syrup TAKE 1 TEASPOON ( ) BY MOUTH, AT BEDTIME AS NEEDED FOR EXCESSIVE COUGH 03/26/20 03/26/21  Kerri Perches, MD  rosuvastatin (CRESTOR) 5 MG tablet Take 1 tablet (5 mg total) by mouth daily. 01/22/20   Freddy Finner, NP    Family History Family History  Problem Relation Age of Onset  . Thrombosis Mother   . Diabetes Father   . Pulmonary Hypertension Father   . Breast cancer Neg Hx   . Bladder Cancer Neg Hx   . Kidney cancer Neg Hx     Social History Social History   Tobacco Use  . Smoking status: Never Smoker  . Smokeless tobacco: Never Used  Vaping Use  . Vaping Use: Never used  Substance Use Topics  . Alcohol use: Not Currently    Comment: occasionally  . Drug use: No      Allergies   Maxzide [triamterene-hctz]   Review of Systems Review of Systems  Constitutional: Negative for chills and fever.  HENT: Positive for ear pain and sore throat.   Respiratory: Negative for cough and shortness of breath.   Cardiovascular: Negative for chest pain and palpitations.  Gastrointestinal: Negative for abdominal pain and vomiting.  Skin: Negative for color change and rash.  All other systems reviewed and are negative.    Physical Exam Triage Vital Signs ED Triage Vitals  Enc Vitals Group     BP      Pulse  Resp      Temp      Temp src      SpO2      Weight      Height      Head Circumference      Peak Flow      Pain Score      Pain Loc      Pain Edu?      Excl. in GC?    No data found.  Updated Vital Signs BP 114/78 (BP Location: Left Arm)   Pulse 88   Temp 99.3 F (37.4 C) (Oral)   Resp 15   LMP 06/24/2014   SpO2 97%   Visual Acuity Right Eye Distance:   Left Eye Distance:   Bilateral Distance:    Right Eye Near:   Left Eye Near:    Bilateral Near:     Physical Exam Vitals and nursing note reviewed.  Constitutional:      General: She is not in acute distress.    Appearance: She is well-developed. She is not ill-appearing.  HENT:     Head: Normocephalic and atraumatic.     Right Ear: Tympanic membrane normal.     Left Ear: Tympanic membrane normal.     Nose: Nose normal.     Mouth/Throat:     Mouth: Mucous membranes are moist.     Pharynx: Oropharynx is clear.  Eyes:     Conjunctiva/sclera: Conjunctivae normal.  Cardiovascular:     Rate and Rhythm: Normal rate and regular rhythm.     Heart sounds: Normal heart sounds.  Pulmonary:     Effort: Pulmonary effort is normal. No respiratory distress.     Breath sounds: Normal breath sounds.  Abdominal:     Palpations: Abdomen is soft.     Tenderness: There is no abdominal tenderness.  Musculoskeletal:     Cervical back: Neck supple.  Skin:    General: Skin is  warm and dry.     Findings: No rash.  Neurological:     General: No focal deficit present.     Mental Status: She is alert and oriented to person, place, and time.     Gait: Gait normal.  Psychiatric:        Mood and Affect: Mood normal.        Behavior: Behavior normal.      UC Treatments / Results  Labs (all labs ordered are listed, but only abnormal results are displayed) Labs Reviewed  CULTURE, GROUP A STREP [facility])  POCT RAPID STREP A (OFFICE)    EKG   Radiology No results found.  Procedures Procedures (including critical care time)  Medications Ordered in UC Medications - No data to display  Initial Impression / Assessment and Plan / UC Course  I have reviewed the triage vital signs and the nursing notes.  Pertinent labs & imaging results that were available during my care of the patient were reviewed by me and considered in my medical decision making (see chart for details).   Viral URI.  Rapid strep negative; culture pending.  Patient has a PCR COVID pending through [facility].  Discussed symptomatic treatment including Tylenol or ibuprofen, rest, hydration.  Instructed patient to follow-up with her PCP if her symptoms are not improving.  She agrees to plan of care.   Final Clinical Impressions(s) / UC Diagnoses   Final diagnoses:  Viral URI     Discharge Instructions     Your rapid strep test is  negative.  A throat culture is pending; we will call you if it is positive requiring treatment.    Follow up with your primary care provider if your symptoms are not improving.        ED Prescriptions    None     PDMP not reviewed this encounter.   Mickie Bail, NP 11/27/20 1616

## 2020-11-27 NOTE — Discharge Instructions (Signed)
Your rapid strep test is negative.  A throat culture is pending; we will call you if it is positive requiring treatment.    Follow up with your primary care provider if your symptoms are not improving.    

## 2020-11-30 LAB — CULTURE, GROUP A STREP (THRC)

## 2021-01-06 ENCOUNTER — Ambulatory Visit (INDEPENDENT_AMBULATORY_CARE_PROVIDER_SITE_OTHER): Payer: 59 | Admitting: Podiatry

## 2021-01-06 ENCOUNTER — Other Ambulatory Visit: Payer: Self-pay

## 2021-01-06 ENCOUNTER — Ambulatory Visit (INDEPENDENT_AMBULATORY_CARE_PROVIDER_SITE_OTHER): Payer: 59

## 2021-01-06 DIAGNOSIS — M25572 Pain in left ankle and joints of left foot: Secondary | ICD-10-CM

## 2021-01-06 DIAGNOSIS — M659 Synovitis and tenosynovitis, unspecified: Secondary | ICD-10-CM

## 2021-01-06 DIAGNOSIS — R600 Localized edema: Secondary | ICD-10-CM

## 2021-01-06 DIAGNOSIS — G8929 Other chronic pain: Secondary | ICD-10-CM

## 2021-01-06 MED ORDER — BETAMETHASONE SOD PHOS & ACET 6 (3-3) MG/ML IJ SUSP
3.0000 mg | Freq: Once | INTRAMUSCULAR | Status: AC
  Administered 2021-01-06: 3 mg via INTRA_ARTICULAR

## 2021-01-06 NOTE — Progress Notes (Signed)
   Subjective:  55 y.o. female presenting today for an acute flareup of ankle pain the left ankle. Patient has had ankle pain off and on now for approximately 1 year. Injections and conservative treatments help temporarily but the pain recurs. Currently she has had an increase in pain and it is very painful to walk. She presents for further treatment and evaluation.   Past Medical History:  Diagnosis Date   Arthritis    BACK PAIN, LUMBAR 01/15/2008   Qualifier: Diagnosis of  By: Lodema Hong MD, Margaret     Carpal tunnel syndrome    Chronic fatigue    GERD (gastroesophageal reflux disease)    GERD (gastroesophageal reflux disease) 04/06/2017   Glucose intolerance (impaired glucose tolerance) 12/12/2017   Hypertension    per patient this was prior to weight loss surgery/cn 12/01/17   Kidney stone on right side 12/21/2016   Metabolic syndrome X 09/02/2010   Qualifier: Diagnosis of  By: Lillia Mountain LPN, Brandi     Need for shingles vaccine 12/11/2018   Obesity    Pancreatitis 07/2014   had  gall bladder removed , spent 1 week in hospital   PONV (postoperative nausea and vomiting)    Seasonal allergies 04/04/2012     Objective / Physical Exam:  General:  The patient is alert and oriented x3 in no acute distress. Dermatology:  Skin is warm, dry and supple bilateral lower extremities. Negative for open lesions or macerations. Vascular:  Palpable pedal pulses bilaterally. Edema noted localized around the ankle region. Capillary refill within normal limits. Neurological:  Epicritic and protective threshold grossly intact bilaterally.  Musculoskeletal Exam:  Pain on palpation to the anterior lateral medial aspects of the patient's left ankle. Mild edema noted. Range of motion within normal limits to all pedal and ankle joints bilateral. Muscle strength 5/5 in all groups bilateral.   Radiographic Exam:  Normal osseous mineralization. Joint spaces preserved. No fracture/dislocation/boney destruction.     Assessment: 1.  Acute on chronic ankle pain left 2.  Edema left ankle  Plan of Care:  1. Patient was evaluated. X-Rays reviewed.  2. Injection of 0.5 mL Celestone Soluspan injected in the patient's left ankle. 3.  MRI ordered LT ankle without contrast.  Ordered at [facility] 4.  Continue meloxicam as needed 5.  Recommend that the patient resume either the cam boot or ankle brace for support.  Patient has both at home 6.  Return to clinic after MRI to review results and discuss further treatment options  *Housekeeping at [facility]   Felecia Shelling, DPM [facility]  Dr. Felecia Shelling, DPM    [address] [place], [place] [address]                Office [phone]  Fax [phone]

## 2021-01-22 ENCOUNTER — Other Ambulatory Visit (HOSPITAL_COMMUNITY)
Admission: RE | Admit: 2021-01-22 | Discharge: 2021-01-22 | Disposition: A | Discharge status: Home / Self Care | Payer: 59 | Source: Ambulatory Visit | Attending: Family Medicine | Admitting: Family Medicine

## 2021-01-22 ENCOUNTER — Other Ambulatory Visit: Payer: Self-pay

## 2021-01-22 ENCOUNTER — Ambulatory Visit (INDEPENDENT_AMBULATORY_CARE_PROVIDER_SITE_OTHER): Payer: 59 | Admitting: Family Medicine

## 2021-01-22 ENCOUNTER — Encounter: Payer: Self-pay | Admitting: Family Medicine

## 2021-01-22 VITALS — BP 113/75 | HR 77 | Temp 98.1°F | Resp 18 | Ht 65.0 in | Wt 205.0 lb

## 2021-01-22 DIAGNOSIS — Z6834 Body mass index (BMI) 34.0-34.9, adult: Secondary | ICD-10-CM

## 2021-01-22 DIAGNOSIS — I10 Essential (primary) hypertension: Secondary | ICD-10-CM | POA: Diagnosis not present

## 2021-01-22 DIAGNOSIS — N760 Acute vaginitis: Secondary | ICD-10-CM | POA: Diagnosis not present

## 2021-01-22 DIAGNOSIS — R350 Frequency of micturition: Secondary | ICD-10-CM

## 2021-01-22 DIAGNOSIS — E559 Vitamin D deficiency, unspecified: Secondary | ICD-10-CM | POA: Diagnosis not present

## 2021-01-22 DIAGNOSIS — D539 Nutritional anemia, unspecified: Secondary | ICD-10-CM | POA: Diagnosis not present

## 2021-01-22 DIAGNOSIS — Z0000 Encounter for general adult medical examination without abnormal findings: Secondary | ICD-10-CM

## 2021-01-22 DIAGNOSIS — E66811 Obesity, class 1: Secondary | ICD-10-CM

## 2021-01-22 DIAGNOSIS — M79672 Pain in left foot: Secondary | ICD-10-CM

## 2021-01-22 DIAGNOSIS — Z1211 Encounter for screening for malignant neoplasm of colon: Secondary | ICD-10-CM

## 2021-01-22 DIAGNOSIS — E7849 Other hyperlipidemia: Secondary | ICD-10-CM | POA: Diagnosis not present

## 2021-01-22 DIAGNOSIS — Z131 Encounter for screening for diabetes mellitus: Secondary | ICD-10-CM

## 2021-01-22 DIAGNOSIS — E6609 Other obesity due to excess calories: Secondary | ICD-10-CM

## 2021-01-22 DIAGNOSIS — G8929 Other chronic pain: Secondary | ICD-10-CM

## 2021-01-22 LAB — POCT URINALYSIS DIPSTICK
Bilirubin, UA: NEGATIVE
Glucose: NEGATIVE
Ketones, UA: NEGATIVE
Nitrite, UA: NEGATIVE
Protein, UA: NEGATIVE
Spec Grav, UA: 1.015 (ref 1.010–1.025)
Urobilinogen, UA: 1 E.U./dL
pH, UA: 6 (ref 5.0–8.0)

## 2021-01-22 MED ORDER — SULFAMETHOXAZOLE-TRIMETHOPRIM 800-160 MG PO TABS
1.0000 | ORAL_TABLET | Freq: Two times a day (BID) | ORAL | 0 refills | Status: DC
  Filled 2021-01-22: qty 6, 3d supply, fill #0

## 2021-01-22 NOTE — Progress Notes (Signed)
Vit d    Kristy Travis     MRN: [id number]      DOB: Dec 06, 1965  HPI: Patient is in for annual physical exam.4 month h/o swelling and pain of left ankle and foot, no trauma, worsening 2 week h/o dysuria, mild nausea, no loose stool Wants STD testing No other health concerns are expressed or addressed at the visit. . Immunization is reviewed , and  updated if needed.   PE: BP 113/75 (BP Location: Right Arm, Patient Position: Sitting, Cuff Size: Large)   Pulse 77   Temp 98.1 F (36.7 C)   Resp 18   Ht 5\' 5"  (1.651 m)   Wt 205 lb (93 kg)   LMP 06/24/2014   SpO2 99%   BMI 34.11 kg/m   Pleasant  female, alert and oriented x 3, in no cardio-pulmonary distress. Afebrile. HEENT No facial trauma or asymetry. Sinuses non tender.  Extra occullar muscles intact.. External ears normal, . Neck: supple, no adenopathy,JVD or thyromegaly.No bruits.  Chest: Clear to ascultation bilaterally.No crackles or wheezes. Non tender to palpation  Cardiovascular system; Heart sounds normal,  S1 and  S2 ,no S3.  No murmur, or thrill. .  Abdomen: Soft, non tender, no organomegaly or masses. No bruits. Bowel sounds normal. No guarding, tenderness or rebound.   .   Musculoskeletal exam: Full ROM of spine, hips , shoulders and knees.decreased in left ankle  deformity and swelling of left ankle noted. No muscle wasting or atrophy.   Neurologic: Cranial nerves 2 to 12 intact. Power, tone ,sensation and reflexes normal throughout. No disturbance in gait. No tremor.  Skin: Intact, no ulceration, erythema , scaling or rash noted. Pigmentation normal throughout  Psych; Normal mood and affect. Judgement and concentration normal   Assessment & Plan:  Annual physical exam Annual exam as documented. Counseling done  re healthy lifestyle involving commitment to 150 minutes exercise per week, heart healthy diet, and attaining healthy weight.The importance of adequate sleep also  discussed. Regular seat belt use and home safety, is also discussed. Changes in health habits are decided on by the patient with goals and time frames  set for achieving them. Immunization and cancer screening needs are specifically addressed at this visit.   Chronic foot pain, left 4 month history of left ankle and foot pain , swelling and reduced mobility, has seen 2 different Podiatrists,  No improvement, will refer to Ortho  Encounter for screening for malignant neoplasm of colon Colonoscopy past due , referral entered to new Practice  Obesity  Patient re-educated about  the importance of commitment to a  minimum of 150 minutes of exercise per week as able.  The importance of healthy food choices with portion control discussed, as well as eating regularly and within a 12 hour window most days. The need to choose "clean , green" food 50 to 75% of the time is discussed, as well as to make water the primary drink and set a goal of 64 ounces water daily.    Weight /BMI 01/22/2021 05/15/2020 04/18/2020  WEIGHT 205 lb 195 lb 196 lb 12.8 oz  HEIGHT 5\' 5"  5\' 5"  5\' 5"   BMI 34.11 kg/m2 32.45 kg/m2 32.75 kg/m2

## 2021-01-22 NOTE — Patient Instructions (Addendum)
Follow-up in office in 6 months reevaluate blood pressure and weight.  Call if you need me sooner.  Please schedule mammogram in [place] at checkout  Please schedule second COVID booster this is past due.  You are referred for colonoscopy in [place] this is past due and very important.  You are referred to Dr. Sharol Given  to for chronic left foot and ankle pain.  3-day course of antibiotic is prescribed for presumed urinary tract infection urine is being sent for culture and you will be contacted with the result.  Self collected specimens today as requested.  Labs today CBC lipid panel CMP and EGFR TSH hemoglobin A1c vitamin B12 level vitamin D level.  Think about what you will eat, plan ahead. Choose " clean, green, fresh or frozen" over canned, processed or packaged foods which are more sugary, salty and fatty. 70 to 75% of food eaten should be vegetables and fruit. Three meals at set times with snacks allowed between meals, but they must be fruit or vegetables. Aim to eat over a 12 hour period , example 7 am to 7 pm, and STOP after  your last meal of the day. Drink water,generally about 64 ounces per day, no other drink is as healthy. Fruit juice is best enjoyed in a healthy way, by EATING the fruit. It is important that you exercise regularly at least 30 minutes 5 times a week. If you develop chest pain, have severe difficulty breathing, or feel very tired, stop exercising immediately and seek medical attention  Thanks for choosing [facility], we consider it a privelige to serve you.

## 2021-01-22 NOTE — Assessment & Plan Note (Signed)

## 2021-01-23 ENCOUNTER — Encounter: Payer: Self-pay | Admitting: Family Medicine

## 2021-01-23 ENCOUNTER — Other Ambulatory Visit: Payer: Self-pay

## 2021-01-23 LAB — CERVICOVAGINAL ANCILLARY ONLY
Bacterial Vaginitis (gardnerella): NEGATIVE
Candida Glabrata: NEGATIVE
Candida Vaginitis: NEGATIVE
Chlamydia: NEGATIVE
Comment: NEGATIVE
Comment: NEGATIVE
Comment: NEGATIVE
Comment: NEGATIVE
Comment: NEGATIVE
Comment: NORMAL
Neisseria Gonorrhea: NEGATIVE
Trichomonas: NEGATIVE

## 2021-01-23 LAB — CMP14+EGFR
ALT: 9 IU/L (ref 0–32)
AST: 11 IU/L (ref 0–40)
Albumin/Globulin Ratio: 1.9 (ref 1.2–2.2)
Albumin: 4.4 g/dL (ref 3.8–4.9)
Alkaline Phosphatase: 86 IU/L (ref 44–121)
BUN/Creatinine Ratio: 24 — ABNORMAL HIGH (ref 9–23)
BUN: 16 mg/dL (ref 6–24)
Bilirubin Total: 0.2 mg/dL (ref 0.0–1.2)
CO2: 23 mmol/L (ref 20–29)
Calcium: 9.5 mg/dL (ref 8.7–10.2)
Chloride: 101 mmol/L (ref 96–106)
Creatinine, Ser: 0.67 mg/dL (ref 0.57–1.00)
Globulin, Total: 2.3 g/dL (ref 1.5–4.5)
Glucose: 89 mg/dL (ref 65–99)
Potassium: 4.5 mmol/L (ref 3.5–5.2)
Sodium: 142 mmol/L (ref 134–144)
Total Protein: 6.7 g/dL (ref 6.0–8.5)
eGFR: 104 mL/min/{1.73_m2} (ref >59)

## 2021-01-23 LAB — VITAMIN D 25 HYDROXY (VIT D DEFICIENCY, FRACTURES): Vit D, 25-Hydroxy: 20.6 ng/mL — ABNORMAL LOW (ref 30.0–100.0)

## 2021-01-23 LAB — HEMOGLOBIN A1C
Est. average glucose Bld gHb Est-mCnc: 120 mg/dL
Hgb A1c MFr Bld: 5.8 % — ABNORMAL HIGH (ref 4.8–5.6)

## 2021-01-23 LAB — LIPID PANEL
Chol/HDL Ratio: 3.4 ratio (ref 0.0–4.4)
Cholesterol, Total: 251 mg/dL — ABNORMAL HIGH (ref 100–199)
HDL: 74 mg/dL (ref >39)
LDL Chol Calc (NIH): 159 mg/dL — ABNORMAL HIGH (ref 0–99)
Triglycerides: 105 mg/dL (ref 0–149)
VLDL Cholesterol Cal: 18 mg/dL (ref 5–40)

## 2021-01-23 LAB — CBC
Hematocrit: 36.9 % (ref 34.0–46.6)
Hemoglobin: 11.8 g/dL (ref 11.1–15.9)
MCH: 26.3 pg — ABNORMAL LOW (ref 26.6–33.0)
MCHC: 32 g/dL (ref 31.5–35.7)
MCV: 82 fL (ref 79–97)
Platelets: 250 10*3/uL (ref 150–450)
RBC: 4.48 x10E6/uL (ref 3.77–5.28)
RDW: 13.2 % (ref 11.7–15.4)
WBC: 6.1 10*3/uL (ref 3.4–10.8)

## 2021-01-23 LAB — TSH: TSH: 3.21 u[IU]/mL (ref 0.450–4.500)

## 2021-01-23 LAB — VITAMIN B12: Vitamin B-12: 368 pg/mL (ref 232–1245)

## 2021-01-23 MED ORDER — ERGOCALCIFEROL 1.25 MG (50000 UT) PO CAPS
50000.0000 [IU] | ORAL_CAPSULE | ORAL | 2 refills | Status: DC
  Filled 2021-01-23: qty 12, 84d supply, fill #0
  Filled 2021-04-06: qty 12, 84d supply, fill #1
  Filled 2021-10-06: qty 12, 84d supply, fill #2

## 2021-01-23 NOTE — Assessment & Plan Note (Signed)
Colonoscopy past due , referral entered to new Practice

## 2021-01-23 NOTE — Assessment & Plan Note (Signed)
  Patient re-educated about  the importance of commitment to a  minimum of 150 minutes of exercise per week as able.  The importance of healthy food choices with portion control discussed, as well as eating regularly and within a 12 hour window most days. The need to choose "clean , green" food 50 to 75% of the time is discussed, as well as to make water the primary drink and set a goal of 64 ounces water daily.    Weight /BMI 01/22/2021 05/15/2020 04/18/2020  WEIGHT 205 lb 195 lb 196 lb 12.8 oz  HEIGHT 5\' 5"  5\' 5"  5\' 5"   BMI 34.11 kg/m2 32.45 kg/m2 32.75 kg/m2

## 2021-01-23 NOTE — Assessment & Plan Note (Signed)
4 month history of left ankle and foot pain , swelling and reduced mobility, has seen 2 different Podiatrists,  No improvement, will refer to Ortho

## 2021-01-27 ENCOUNTER — Other Ambulatory Visit: Payer: Self-pay

## 2021-01-27 ENCOUNTER — Other Ambulatory Visit: Payer: Self-pay | Admitting: Family Medicine

## 2021-01-27 ENCOUNTER — Encounter: Payer: Self-pay | Admitting: Family Medicine

## 2021-01-27 DIAGNOSIS — E559 Vitamin D deficiency, unspecified: Secondary | ICD-10-CM

## 2021-01-27 DIAGNOSIS — Z131 Encounter for screening for diabetes mellitus: Secondary | ICD-10-CM

## 2021-01-27 DIAGNOSIS — E7849 Other hyperlipidemia: Secondary | ICD-10-CM

## 2021-01-27 DIAGNOSIS — I10 Essential (primary) hypertension: Secondary | ICD-10-CM

## 2021-01-27 LAB — URINE CULTURE

## 2021-01-27 MED ORDER — FLUCONAZOLE 150 MG PO TABS
150.0000 mg | ORAL_TABLET | Freq: Once | ORAL | 0 refills | Status: AC
  Filled 2021-01-27 – 2021-02-23 (×2): qty 1, 1d supply, fill #0

## 2021-01-27 NOTE — Progress Notes (Signed)
Orders put in, pt informed.

## 2021-01-29 ENCOUNTER — Ambulatory Visit (INDEPENDENT_AMBULATORY_CARE_PROVIDER_SITE_OTHER): Payer: 59 | Admitting: Orthopedic Surgery

## 2021-01-29 DIAGNOSIS — M25572 Pain in left ankle and joints of left foot: Secondary | ICD-10-CM | POA: Diagnosis not present

## 2021-02-08 ENCOUNTER — Encounter: Payer: Self-pay | Admitting: Orthopedic Surgery

## 2021-02-08 NOTE — Progress Notes (Addendum)
Office Visit Note   Patient: Kristy Travis           Date of Birth: Mar 02, 1966           MRN: [id number] Visit Date: 01/29/2021              Requested by: Kerri Perches, MD [address], [address] [place],  [place] [address] PCP: Kerri Perches, MD  Chief Complaint  Patient presents with   Left Ankle - Pain   Left Foot - Pain      HPI: Patient is a 55 year old woman who presents in follow-up for left foot and ankle pain.  Patient complains of swelling and pain with weightbearing for the past 5 months denies any specific injury.  Patient states the pain is all along the medial aspect of her foot and ankle.  Patient states she has had steroid injections a fracture boot and ASO without relief.  Assessment & Plan: Visit Diagnoses:  1. Pain in left ankle and joints of left foot     Plan: Patient's ankle was injected reevaluate in 4 weeks.  Her symptoms seem most consistent with impingement syndrome of the ankle.  Follow-Up Instructions: Return in about 4 weeks (around 02/26/2021).   Ortho Exam  Patient is alert, oriented, no adenopathy, well-dressed, normal affect, normal respiratory effort. Examination patient has good pulses.  The posterior tibial tendon and peroneal tendons are asymptomatic and functioning properly.  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  Patient is tender to palpation anteriorly over the ankle joint line.  There is no redness no cellulitis.  Patient's previous radiographs showed no bony abnormalities a MRI scan has been ordered but not scheduled.  Imaging: No results found. No images are attached to the encounter.  Labs: Lab Results  Component Value Date   HGBA1C 5.8 (H) 01/22/2021   HGBA1C 5.6 01/17/2020   HGBA1C 5.6 01/17/2020   HGBA1C 5.6 (A) 01/17/2020   HGBA1C 5.6 01/17/2020   ESRSEDRATE 18 09/04/2010   ESRSEDRATE 20 07/18/2007   REPTSTATUS  11/30/2020 FINAL 11/27/2020   CULT  11/27/2020    NO GROUP A STREP (S.PYOGENES) ISOLATED Performed at [facility], [address] [place], [place] [address]      Lab Results  Component Value Date   ALBUMIN 4.4 01/22/2021   ALBUMIN 4.4 11/14/2018   ALBUMIN 4.5 01/26/2018    Lab Results  Component Value Date   MG 1.8 10/03/2013   MG 2.0 08/16/2013   Lab Results  Component Value Date   VD25OH 20.6 (L) 01/22/2021   VD25OH 22 (L) 01/17/2020   VD25OH 35.9 11/14/2018    No results found for: PREALBUMIN CBC EXTENDED Latest Ref Rng & Units 01/22/2021 01/17/2020 11/14/2018  WBC 3.4 - 10.8 x10E3/uL 6.1 5.1 5.3  RBC 3.77 - 5.28 x10E6/uL 4.48 4.98 4.41  HGB 11.1 - 15.9 g/dL 87.5 64.3 32.9  HCT 51.8 - 46.6 % 36.9 40.5 37.1  PLT 150 - 450 x10E3/uL 250 261 259  NEUTROABS 1,500 - 7,800 cells/uL - - -  LYMPHSABS 850 - 3,900 cells/uL - - -     There is no height or weight on file to calculate BMI.  Orders:  No orders of the defined types were placed in this encounter.  No orders of the defined types were placed in this encounter.    Procedures: Medium Joint Inj: L ankle on 02/23/2021  8:19 AM Indications: pain and diagnostic evaluation Details: 22 G 1.5 in needle, anteromedial approach Medications: 2 mL lidocaine 1 %; 40 mg methylPREDNISolone acetate 40 MG/ML Outcome: tolerated well, no immediate complications Procedure, treatment alternatives, risks and benefits explained, specific risks discussed. Consent was given by the patient. Immediately prior to procedure a time out was called to verify the correct patient, procedure, equipment, support staff and site/side marked as required. Patient was prepped and draped in the usual sterile fashion.     Clinical Data: No additional findings.  ROS:  All other systems negative, except as noted in the HPI. Review of Systems  Objective: Vital Signs: LMP 06/24/2014   Specialty Comments:  No specialty comments  available.  PMFS History: Patient Active Problem List   Diagnosis Date Noted   Chronic foot pain, left 01/22/2021   Diabetes mellitus screening 01/17/2020   Screening mammogram, encounter for 01/17/2020   Encounter for screening for malignant neoplasm of colon 01/17/2020   Mass of soft tissue of chest 01/17/2020   Primary osteoarthritis of left knee 12/12/2017   Patellofemoral stress syndrome 12/12/2017   GERD (gastroesophageal reflux disease) 04/06/2017   Chronic fatigue disorder 12/22/2016   Annual physical exam 12/30/2012   Vitamin D deficiency 08/27/2011   Hyperlipidemia 02/06/2010   Obesity 12/21/2007   Essential hypertension 12/21/2007   Past Medical History:  Diagnosis Date   Arthritis    BACK PAIN, LUMBAR 01/15/2008   Qualifier: Diagnosis of  By: Lodema Hong MD, Margaret     Carpal tunnel syndrome    Chronic fatigue    GERD (gastroesophageal reflux disease)    GERD (gastroesophageal reflux disease) 04/06/2017   Glucose intolerance (impaired glucose tolerance) 12/12/2017   Hypertension    per patient this was prior to weight loss surgery/cn 12/01/17   Kidney stone on right side 12/21/2016   Metabolic syndrome X 09/02/2010   Qualifier: Diagnosis of  By: Lillia Mountain LPN, Brandi     Need for shingles vaccine 12/11/2018   Obesity    Pancreatitis 07/2014   had  gall bladder removed , spent 1 week in hospital   PONV (postoperative nausea and vomiting)    Seasonal allergies 04/04/2012    Family History  Problem Relation Age of Onset   Thrombosis Mother    Diabetes Father    Pulmonary Hypertension Father    Breast cancer Neg Hx    Bladder Cancer Neg Hx    Kidney cancer Neg Hx     Past Surgical History:  Procedure Laterality Date   ABDOMINAL HYSTERECTOMY     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   CHOLECYSTECTOMY  07/2014   cyst removed from left foot and bone removed from left foot  April and June 2010   DILATION AND CURETTAGE OF UTERUS     ESOPHAGOGASTRODUODENOSCOPY N/A  03/03/2016   Procedure: ESOPHAGOGASTRODUODENOSCOPY (EGD);  Surgeon: Malissa Hippo, MD;  [facility];  Service: Endoscopy;  Laterality: N/A;  3:00 - moved to 2:00 - Ann to notify pt   KNEE ARTHROSCOPY Right 12/12/2017   Procedure: ARTHROSCOPY KNEE, MEDIAL CHONDROPLASTY, LOOSE BODY REMOVAL,PARTIAL LATERAL MENISCECTOMY;  Surgeon: Donato Heinz, MD;  Location: [facility] OR[facility];  Service: Orthopedics;  Laterality: Right;   KNEE ARTHROSCOPY Left 06/02/2018   Procedure: ARTHROSCOPY KNEE, MEDIAL CHONDROPLASTY, PARTIAL MEDIAL MENISCECTOMY;  Surgeon: Donato Heinz, MD;  Location: [facility] ORS;  Service: Orthopedics;  Laterality: Left;   laparoscopy to eval dyspepsia  2004   TOTAL VAGINAL HYSTERECTOMY  06/2014  with BSO   Social History   Occupational History   Not on file  Tobacco Use   Smoking status: Never   Smokeless tobacco: Never  Vaping Use   Vaping Use: Never used  Substance and Sexual Activity   Alcohol use: Not Currently    Comment: occasionally   Drug use: No   Sexual activity: Yes    Birth control/protection: Surgical

## 2021-02-16 ENCOUNTER — Other Ambulatory Visit: Payer: Self-pay

## 2021-02-23 ENCOUNTER — Encounter: Payer: Self-pay | Admitting: Orthopedic Surgery

## 2021-02-23 ENCOUNTER — Ambulatory Visit (INDEPENDENT_AMBULATORY_CARE_PROVIDER_SITE_OTHER): Payer: 59 | Admitting: Orthopedic Surgery

## 2021-02-23 ENCOUNTER — Other Ambulatory Visit: Payer: Self-pay

## 2021-02-23 DIAGNOSIS — M25572 Pain in left ankle and joints of left foot: Secondary | ICD-10-CM

## 2021-02-23 MED ORDER — METHYLPREDNISOLONE ACETATE 40 MG/ML IJ SUSP
40.0000 mg | INTRAMUSCULAR | Status: AC | PRN
  Administered 2021-02-23: 40 mg via INTRA_ARTICULAR

## 2021-02-23 MED ORDER — LIDOCAINE HCL 1 % IJ SOLN
2.0000 mL | INTRAMUSCULAR | Status: AC | PRN
  Administered 2021-02-23: 2 mL

## 2021-02-23 MED ORDER — PREDNISONE 10 MG PO TABS
10.0000 mg | ORAL_TABLET | Freq: Every day | ORAL | 0 refills | Status: DC
  Filled 2021-02-23: qty 30, 30d supply, fill #0

## 2021-02-23 NOTE — Addendum Note (Signed)
Addended by: Rodena Medin on: 02/23/2021 08:54 AM   Modules accepted: Orders

## 2021-02-23 NOTE — Progress Notes (Signed)
Office Visit Note   Patient: Kristy Travis           Date of Birth: Jul 19, 1965           MRN: [id number] Visit Date: 02/23/2021              Requested by: Kerri Perches, MD [address] [place],  [place] [address] PCP: Kerri Perches, MD  Chief Complaint  Patient presents with   Left Ankle - Follow-up    S/p injection 01/29/21      HPI: Patient is a 55 year old woman who was seen in follow-up for impingement symptoms left ankle status post injection.  She states the injection did not help she states she still has pain around the ankle anteriorly and over the peroneal tendons.  Patient also complains of pain over the fifth metatarsal head of the right foot.  She also states she now has pain in the joints of both hands with range of motion with start up stiffness.  Patient states there was rheumatoid arthritis in her grandmother.  Assessment & Plan: Visit Diagnoses:  1. Pain in left ankle and joints of left foot     Plan: We will obtain a rheumatoid screen she will start with a low-dose prednisone 10 mg with breakfast wean off as her symptoms improve and follow-up in the office in 2 weeks.  Follow-Up Instructions: Return in about 2 weeks (around 03/09/2021).   Ortho Exam  Patient is alert, oriented, no adenopathy, well-dressed, normal affect, normal respiratory effort. Examination patient still has tenderness to palpation anteriorly over the left ankle she has more tenderness at this time to palpation over the peroneal tendons there is no effusion around the tendons there is no palpable defect.  Patient also has tenderness to palpation beneath the fifth metatarsal head of the right foot.  She has good pulses in both feet and has good dorsiflexion past neutral and both lower extremities.  Patient has no sausage digit swelling of her fingers there is no ulnar deviation of the MCP joints there is no Heberden's or Bouchard's nodes of her fingers but she  does have pain and stiffness in the PIP and DIP joints.  Imaging: No results found. No images are attached to the encounter.  Labs: Lab Results  Component Value Date   HGBA1C 5.8 (H) 01/22/2021   HGBA1C 5.6 01/17/2020   HGBA1C 5.6 01/17/2020   HGBA1C 5.6 (A) 01/17/2020   HGBA1C 5.6 01/17/2020   ESRSEDRATE 18 09/04/2010   ESRSEDRATE 20 07/18/2007   REPTSTATUS 11/30/2020 FINAL 11/27/2020   CULT  11/27/2020    NO GROUP A STREP (S.PYOGENES) ISOLATED Performed at [facility], [address] [place], [place] [address]      Lab Results  Component Value Date   ALBUMIN 4.4 01/22/2021   ALBUMIN 4.4 11/14/2018   ALBUMIN 4.5 01/26/2018    Lab Results  Component Value Date   MG 1.8 10/03/2013   MG 2.0 08/16/2013   Lab Results  Component Value Date   VD25OH 20.6 (L) 01/22/2021   VD25OH 22 (L) 01/17/2020   VD25OH 35.9 11/14/2018    No results found for: PREALBUMIN CBC EXTENDED Latest Ref Rng & Units 01/22/2021 01/17/2020 11/14/2018  WBC 3.4 - 10.8 x10E3/uL 6.1 5.1 5.3  RBC 3.77 - 5.28 x10E6/uL 4.48 4.98 4.41  HGB 11.1 - 15.9 g/dL 38.1 01.7 51.0  HCT 25.8 - 46.6 % 36.9 40.5 37.1  PLT 150 - 450 x10E3/uL  250 261 259  NEUTROABS 1,500 - 7,800 cells/uL - - -  LYMPHSABS 850 - 3,900 cells/uL - - -     There is no height or weight on file to calculate BMI.  Orders:  No orders of the defined types were placed in this encounter.  No orders of the defined types were placed in this encounter.    Procedures: No procedures performed  Clinical Data: No additional findings.  ROS:  All other systems negative, except as noted in the HPI. Review of Systems  Objective: Vital Signs: LMP 06/24/2014   Specialty Comments:  No specialty comments available.  PMFS History: Patient Active Problem List   Diagnosis Date Noted   Chronic foot pain, left 01/22/2021   Diabetes mellitus screening 01/17/2020   Screening mammogram, encounter for 01/17/2020   Encounter for  screening for malignant neoplasm of colon 01/17/2020   Mass of soft tissue of chest 01/17/2020   Primary osteoarthritis of left knee 12/12/2017   Patellofemoral stress syndrome 12/12/2017   GERD (gastroesophageal reflux disease) 04/06/2017   Chronic fatigue disorder 12/22/2016   Annual physical exam 12/30/2012   Vitamin D deficiency 08/27/2011   Hyperlipidemia 02/06/2010   Obesity 12/21/2007   Essential hypertension 12/21/2007   Past Medical History:  Diagnosis Date   Arthritis    BACK PAIN, LUMBAR 01/15/2008   Qualifier: Diagnosis of  By: Lodema Hong MD, Margaret     Carpal tunnel syndrome    Chronic fatigue    GERD (gastroesophageal reflux disease)    GERD (gastroesophageal reflux disease) 04/06/2017   Glucose intolerance (impaired glucose tolerance) 12/12/2017   Hypertension    per patient this was prior to weight loss surgery/cn 12/01/17   Kidney stone on right side 12/21/2016   Metabolic syndrome X 09/02/2010   Qualifier: Diagnosis of  By: Lillia Mountain LPN, Brandi     Need for shingles vaccine 12/11/2018   Obesity    Pancreatitis 07/2014   had  gall bladder removed , spent 1 week in hospital   PONV (postoperative nausea and vomiting)    Seasonal allergies 04/04/2012    Family History  Problem Relation Age of Onset   Thrombosis Mother    Diabetes Father    Pulmonary Hypertension Father    Breast cancer Neg Hx    Bladder Cancer Neg Hx    Kidney cancer Neg Hx     Past Surgical History:  Procedure Laterality Date   ABDOMINAL HYSTERECTOMY     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   CHOLECYSTECTOMY  07/2014   cyst removed from left foot and bone removed from left foot  April and June 2010   DILATION AND CURETTAGE OF UTERUS     ESOPHAGOGASTRODUODENOSCOPY N/A 03/03/2016   Procedure: ESOPHAGOGASTRODUODENOSCOPY (EGD);  Surgeon: Malissa Hippo, MD;  [facility];  Service: Endoscopy;  Laterality: N/A;  3:00 - moved to 2:00 - Ann to notify pt   KNEE ARTHROSCOPY Right 12/12/2017    Procedure: ARTHROSCOPY KNEE, MEDIAL CHONDROPLASTY, LOOSE BODY REMOVAL,PARTIAL LATERAL MENISCECTOMY;  Surgeon: Donato Heinz, MD;  Location: [facility];  Service: Orthopedics;  Laterality: Right;   KNEE ARTHROSCOPY Left 06/02/2018   Procedure: ARTHROSCOPY KNEE, MEDIAL CHONDROPLASTY, PARTIAL MEDIAL MENISCECTOMY;  Surgeon: Donato Heinz, MD;  Location: [facility];  Service: Orthopedics;  Laterality: Left;   laparoscopy to eval dyspepsia  2004   TOTAL VAGINAL HYSTERECTOMY  06/2014   with BSO   Social History   Occupational History   Not on file  Tobacco Use   Smoking status: Never   Smokeless tobacco: Never  Vaping Use   Vaping Use: Never used  Substance and Sexual Activity   Alcohol use: Not Currently    Comment: occasionally   Drug use: No   Sexual activity: Yes    Birth control/protection: Surgical

## 2021-02-24 LAB — RHEUMATOID FACTOR: Rheumatoid fact SerPl-aCnc: <14 IU/mL (ref <14)

## 2021-02-24 LAB — C-REACTIVE PROTEIN: CRP: 3 mg/L (ref <8.0)

## 2021-02-24 LAB — URIC ACID: Uric Acid, Serum: 5.2 mg/dL (ref 2.5–7.0)

## 2021-02-24 LAB — SEDIMENTATION RATE: Sed Rate: 17 mm/h (ref 0–30)

## 2021-03-09 ENCOUNTER — Ambulatory Visit (INDEPENDENT_AMBULATORY_CARE_PROVIDER_SITE_OTHER): Payer: 59 | Admitting: Physician Assistant

## 2021-03-09 ENCOUNTER — Encounter: Payer: Self-pay | Admitting: Orthopedic Surgery

## 2021-03-09 DIAGNOSIS — M25572 Pain in left ankle and joints of left foot: Secondary | ICD-10-CM

## 2021-03-09 NOTE — Progress Notes (Signed)
Office Visit Note   Patient: Kristy Travis           Date of Birth: 01/23/1966           MRN: [id number] Visit Date: 03/09/2021              Requested by: Kerri Perches, MD [address] [place],  [place] [address] PCP: Kerri Perches, MD  Chief Complaint  Patient presents with   Left Ankle - Pain      HPI: Patient presents in follow-up today for her left ankle pain.  She still complains of swelling and pain in her ankle.  Also feelings of catching.  She does not feel like the injection helped her long-term though gave her some temporary relief the first day.  She did have a work-up for rheumatoid arthritis these were negative.  She still does have pain in some of her other joints.  She has been taking an oral steroid which she also reports is helped minimally  Assessment & Plan: Visit Diagnoses:  1. Pain in left ankle and joints of left foot     Plan: Patient has been failing conservative treatment and would recommend an MRI with follow-up with Dr. Lajoyce Corners.  She does have findings consistent with ankle impingement.  If this was the case she might benefit from an arthroscopy.  If the MRI it was negative would probably be appropriate to refer her to for rheumatology for further work-up for any other type of inflammatory arthritis  Follow-Up Instructions: No follow-ups on file.   Ortho Exam  Patient is alert, oriented, no adenopathy, well-dressed, normal affect, normal respiratory effort. Examination she does have some soft tissue swelling about the ankle pain with flexion both laterally and medially.  Good dorsiflexion plantarflexion eversion inversion.  Compartments are soft and compressible.  No signs of cellulitis  Imaging: No results found. No images are attached to the encounter.  Labs: Lab Results  Component Value Date   HGBA1C 5.8 (H) 01/22/2021   HGBA1C 5.6 01/17/2020   HGBA1C 5.6 01/17/2020   HGBA1C 5.6 (A) 01/17/2020   HGBA1C 5.6  01/17/2020   ESRSEDRATE 17 02/23/2021   ESRSEDRATE 18 09/04/2010   ESRSEDRATE 20 07/18/2007   CRP 3.0 02/23/2021   LABURIC 5.2 02/23/2021   REPTSTATUS 11/30/2020 FINAL 11/27/2020   CULT  11/27/2020    NO GROUP A STREP (S.PYOGENES) ISOLATED Performed at [facility], [address], [place], [place] [address]      Lab Results  Component Value Date   ALBUMIN 4.4 01/22/2021   ALBUMIN 4.4 11/14/2018   ALBUMIN 4.5 01/26/2018    Lab Results  Component Value Date   MG 1.8 10/03/2013   MG 2.0 08/16/2013   Lab Results  Component Value Date   VD25OH 20.6 (L) 01/22/2021   VD25OH 22 (L) 01/17/2020   VD25OH 35.9 11/14/2018    No results found for: PREALBUMIN CBC EXTENDED Latest Ref Rng & Units 01/22/2021 01/17/2020 11/14/2018  WBC 3.4 - 10.8 x10E3/uL 6.1 5.1 5.3  RBC 3.77 - 5.28 x10E6/uL 4.48 4.98 4.41  HGB 11.1 - 15.9 g/dL 67.5 44.9 20.1  HCT 00.7 - 46.6 % 36.9 40.5 37.1  PLT 150 - 450 x10E3/uL 250 261 259  NEUTROABS 1,500 - 7,800 cells/uL - - -  LYMPHSABS 850 - 3,900 cells/uL - - -     There is no height or weight on file to calculate BMI.  Orders:  Orders Placed This  Encounter  Procedures   MR Ankle Left w/o contrast   No orders of the defined types were placed in this encounter.    Procedures: No procedures performed  Clinical Data: No additional findings.  ROS:  All other systems negative, except as noted in the HPI. Review of Systems  Objective: Vital Signs: LMP 06/24/2014   Specialty Comments:  No specialty comments available.  PMFS History: Patient Active Problem List   Diagnosis Date Noted   Chronic foot pain, left 01/22/2021   Diabetes mellitus screening 01/17/2020   Screening mammogram, encounter for 01/17/2020   Encounter for screening for malignant neoplasm of colon 01/17/2020   Mass of soft tissue of chest 01/17/2020   Primary osteoarthritis of left knee 12/12/2017   Patellofemoral stress syndrome 12/12/2017   GERD (gastroesophageal  reflux disease) 04/06/2017   Chronic fatigue disorder 12/22/2016   Annual physical exam 12/30/2012   Vitamin D deficiency 08/27/2011   Hyperlipidemia 02/06/2010   Obesity 12/21/2007   Essential hypertension 12/21/2007   Past Medical History:  Diagnosis Date   Arthritis    BACK PAIN, LUMBAR 01/15/2008   Qualifier: Diagnosis of  By: Lodema Hong MD, Margaret     Carpal tunnel syndrome    Chronic fatigue    GERD (gastroesophageal reflux disease)    GERD (gastroesophageal reflux disease) 04/06/2017   Glucose intolerance (impaired glucose tolerance) 12/12/2017   Hypertension    per patient this was prior to weight loss surgery/cn 12/01/17   Kidney stone on right side 12/21/2016   Metabolic syndrome X 09/02/2010   Qualifier: Diagnosis of  By: Lillia Mountain LPN, Brandi     Need for shingles vaccine 12/11/2018   Obesity    Pancreatitis 07/2014   had  gall bladder removed , spent 1 week in hospital   PONV (postoperative nausea and vomiting)    Seasonal allergies 04/04/2012    Family History  Problem Relation Age of Onset   Thrombosis Mother    Diabetes Father    Pulmonary Hypertension Father    Breast cancer Neg Hx    Bladder Cancer Neg Hx    Kidney cancer Neg Hx     Past Surgical History:  Procedure Laterality Date   ABDOMINAL HYSTERECTOMY     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   CHOLECYSTECTOMY  07/2014   cyst removed from left foot and bone removed from left foot  April and June 2010   DILATION AND CURETTAGE OF UTERUS     ESOPHAGOGASTRODUODENOSCOPY N/A 03/03/2016   Procedure: ESOPHAGOGASTRODUODENOSCOPY (EGD);  Surgeon: Malissa Hippo, MD;  [facility];  Service: Endoscopy;  Laterality: N/A;  3:00 - moved to 2:00 - Ann to notify pt   KNEE ARTHROSCOPY Right 12/12/2017   Procedure: ARTHROSCOPY KNEE, MEDIAL CHONDROPLASTY, LOOSE BODY REMOVAL,PARTIAL LATERAL MENISCECTOMY;  Surgeon: Donato Heinz, MD;  Location: [facility];  Service: Orthopedics;  Laterality: Right;   KNEE ARTHROSCOPY  Left 06/02/2018   Procedure: ARTHROSCOPY KNEE, MEDIAL CHONDROPLASTY, PARTIAL MEDIAL MENISCECTOMY;  Surgeon: Donato Heinz, MD;  Location: [facility];  Service: Orthopedics;  Laterality: Left;   laparoscopy to eval dyspepsia  2004   TOTAL VAGINAL HYSTERECTOMY  06/2014   with BSO   Social History   Occupational History   Not on file  Tobacco Use   Smoking status: Never   Smokeless tobacco: Never  Vaping Use   Vaping Use: Never used  Substance and Sexual Activity   Alcohol use: Not Currently    Comment: occasionally  Drug use: No   Sexual activity: Yes    Birth control/protection: Surgical

## 2021-03-11 ENCOUNTER — Ambulatory Visit (INDEPENDENT_AMBULATORY_CARE_PROVIDER_SITE_OTHER): Payer: 59 | Admitting: Gastroenterology

## 2021-03-11 ENCOUNTER — Encounter: Payer: Self-pay | Admitting: Gastroenterology

## 2021-03-11 ENCOUNTER — Other Ambulatory Visit: Payer: Self-pay

## 2021-03-11 VITALS — BP 123/80 | HR 79 | Temp 97.8°F | Ht 65.0 in | Wt 206.6 lb

## 2021-03-11 DIAGNOSIS — K219 Gastro-esophageal reflux disease without esophagitis: Secondary | ICD-10-CM

## 2021-03-11 DIAGNOSIS — Z1211 Encounter for screening for malignant neoplasm of colon: Secondary | ICD-10-CM

## 2021-03-11 MED ORDER — DEXLANSOPRAZOLE 60 MG PO CPDR
60.0000 mg | DELAYED_RELEASE_CAPSULE | Freq: Every day | ORAL | 0 refills | Status: DC
  Filled 2021-03-11: qty 30, 30d supply, fill #0

## 2021-03-11 MED ORDER — NA SULFATE-K SULFATE-MG SULF 17.5-3.13-1.6 GM/177ML PO SOLN
1.0000 | Freq: Once | ORAL | 0 refills | Status: AC
  Filled 2021-03-11: qty 354, 1d supply, fill #0

## 2021-03-11 NOTE — Progress Notes (Signed)
Gastroenterology Consultation  Referring Provider:     Kerri Perches, MD Primary Care Physician:  Kerri Perches, MD Primary Gastroenterologist:  Dr. Servando Snare     Reason for Consultation:     GERD and the need for a screening colonoscopy        HPI:   Kristy Travis is a 55 y.o. y/o female referred for consultation & management of GERD and the need for screening colonoscopy by Dr. Lodema Hong, Milus Mallick, MD. this patient comes in today after being seen by [facility] gastroenterology back in 2017 and 2018.  The patient had an upper endoscopy in 2017 that showed:  - Normal upper third of esophagus and middle third of esophagus. - LA Grade B reflux esophagitis. Erosions involving distal 2 cm of esophageal mucosa. - Z-line regular, 35 cm from the incisors. 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 - Gastritis. Biopsied. - Scar in the gastric antrum. - Normal duodenal bulb and second portion of the duodenum.  The patient's primary care provider also recommended that the patient undergo a screening colonoscopy.  The patient reports that she did lose a significant amount of weight after having her bariatric surgery but gained some of it back.  There is no report of any dysphagia.  She is presently on omeprazole 20 mg twice a day and has acid breakthrough at least once a day.   Past Medical History:  Diagnosis Date   Arthritis    BACK PAIN, LUMBAR 01/15/2008   Qualifier: Diagnosis of  By: Lodema Hong MD, Margaret     Carpal tunnel syndrome    Chronic fatigue    GERD (gastroesophageal reflux disease)    GERD (gastroesophageal reflux disease) 04/06/2017   Glucose intolerance (impaired glucose tolerance) 12/12/2017   Hypertension    per patient this was prior to weight loss surgery/cn 12/01/17   Kidney stone on right side 12/21/2016   Metabolic syndrome X 09/02/2010   Qualifier: Diagnosis of  By: Lillia Mountain LPN, Brandi     Need for shingles vaccine  12/11/2018   Obesity    Pancreatitis 07/2014   had  gall bladder removed , spent 1 week in hospital   PONV (postoperative nausea and vomiting)    Seasonal allergies 04/04/2012    Past Surgical History:  Procedure Laterality Date   ABDOMINAL HYSTERECTOMY     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   CHOLECYSTECTOMY  07/2014   cyst removed from left foot and bone removed from left foot  April and June 2010   DILATION AND CURETTAGE OF UTERUS     ESOPHAGOGASTRODUODENOSCOPY N/A 03/03/2016   Procedure: ESOPHAGOGASTRODUODENOSCOPY (EGD);  Surgeon: Malissa Hippo, MD;  [facility];  Service: Endoscopy;  Laterality: N/A;  3:00 - moved to 2:00 - Ann to notify pt   KNEE ARTHROSCOPY Right 12/12/2017   Procedure: ARTHROSCOPY KNEE, MEDIAL CHONDROPLASTY, LOOSE BODY REMOVAL,PARTIAL LATERAL MENISCECTOMY;  Surgeon: Donato Heinz, MD;  Location: [facility];  Service: Orthopedics;  Laterality: Right;   KNEE ARTHROSCOPY Left 06/02/2018   Procedure: ARTHROSCOPY KNEE, MEDIAL CHONDROPLASTY, PARTIAL MEDIAL MENISCECTOMY;  Surgeon: Donato Heinz, MD;  Location: [facility];  Service: Orthopedics;  Laterality: Left;   laparoscopy to eval dyspepsia  2004   TOTAL VAGINAL HYSTERECTOMY  06/2014   with BSO    Prior to Admission medications   Medication Sig Start Date End Date Taking? Authorizing Provider  diclofenac sodium (VOLTAREN) 1 % GEL Apply 2 g topically 4 (  four) times daily. For knees    [provider]  ergocalciferol (VITAMIN D2) 1.25 MG (50000 UT) capsule Take 1 capsule (50,000 Units total) by mouth once a week. One capsule once weekly 01/23/21   Kerri Perches, MD  nitrofurantoin, macrocrystal-monohydrate, (MACROBID) 100 MG capsule Take 1 capsule (100 mg total) by mouth 2 (two) times daily. 01/27/21   Kerri Perches, MD  omeprazole (PRILOSEC) 20 MG capsule TAKE 1 CAPSULE BY MOUTH TWICE DAILY BEFORE A MEAL. 11/26/20 11/26/21  Kerri Perches, MD  predniSONE (DELTASONE) 10 MG  tablet Take 1 tablet (10 mg total) by mouth daily with breakfast. 02/23/21   Nadara Mustard, MD  rosuvastatin (CRESTOR) 5 MG tablet Take 1 tablet (5 mg total) by mouth daily. 01/22/20   Freddy Finner, NP  sulfamethoxazole-trimethoprim (BACTRIM DS) 800-160 MG tablet Take 1 tablet by mouth 2 (two) times daily. 01/22/21   Kerri Perches, MD    Family History  Problem Relation Age of Onset   Thrombosis Mother    Diabetes Father    Pulmonary Hypertension Father    Breast cancer Neg Hx    Bladder Cancer Neg Hx    Kidney cancer Neg Hx      Social History   Tobacco Use   Smoking status: Never   Smokeless tobacco: Never  Vaping Use   Vaping Use: Never used  Substance Use Topics   Alcohol use: Not Currently    Comment: occasionally   Drug use: No    Allergies as of 03/11/2021 - Review Complete 03/09/2021  Allergen Reaction Noted   Maxzide [triamterene-hctz] Nausea Only 08/21/2012    Review of Systems:    All systems reviewed and negative except where noted in HPI.   Physical Exam:  LMP 06/24/2014  Patient's last menstrual period was 06/24/2014. General:   Alert,  Well-developed, well-nourished, pleasant and cooperative in NAD Head:  Normocephalic and atraumatic. Eyes:  Sclera clear, no icterus.   Conjunctiva pink. Ears:  Normal auditory acuity. Neck:  Supple; no masses or thyromegaly. Lungs:  Respirations even and unlabored.  Clear throughout to auscultation.   No wheezes, crackles, or rhonchi. No acute distress. Heart:  Regular rate and rhythm; no murmurs, clicks, rubs, or gallops. Abdomen:  Normal bowel sounds.  No bruits.  Soft, non-tender and non-distended without masses, hepatosplenomegaly or hernias noted.  No guarding or rebound tenderness.  Negative Carnett sign.   Rectal:  Deferred.  Pulses:  Normal pulses noted. Extremities:  No clubbing or edema.  No cyanosis. Neurologic:  Alert and oriented x3;  grossly normal neurologically. Skin:  Intact without significant  lesions or rashes.  No jaundice. Lymph Nodes:  No significant cervical adenopathy. Psych:  Alert and cooperative. Normal mood and affect.  Imaging Studies: No results found.  Assessment and Plan:   Kristy Travis is a 55 y.o. y/o female who comes in today with a history of acid reflux not being helped by her omeprazole.  The patient will be switched to Dexilant since she states that she tried Protonix in the past and that also did not work for her.  She will be set up for an EGD and colonoscopy due to her longstanding heartburn and her history of needing a colonoscopy.  The patient has been explained the plan and agrees with it.    Midge Minium, MD. Clementeen Graham    Note: This dictation was prepared with Dragon dictation along with smaller phrase technology. Any transcriptional errors that result from this  process are unintentional.

## 2021-03-17 ENCOUNTER — Encounter: Payer: 59 | Admitting: Obstetrics and Gynecology

## 2021-03-18 ENCOUNTER — Ambulatory Visit: Payer: 59 | Admitting: Gastroenterology

## 2021-03-23 ENCOUNTER — Encounter: Payer: Self-pay | Admitting: Obstetrics and Gynecology

## 2021-03-24 ENCOUNTER — Other Ambulatory Visit: Payer: 59

## 2021-03-30 ENCOUNTER — Other Ambulatory Visit: Payer: 59

## 2021-04-06 ENCOUNTER — Other Ambulatory Visit: Payer: Self-pay

## 2021-04-11 ENCOUNTER — Ambulatory Visit
Admission: RE | Admit: 2021-04-11 | Discharge: 2021-04-11 | Disposition: A | Discharge status: Home / Self Care | Payer: 59 | Source: Ambulatory Visit | Attending: Physician Assistant | Admitting: Physician Assistant

## 2021-04-11 ENCOUNTER — Other Ambulatory Visit: Payer: Self-pay

## 2021-04-11 DIAGNOSIS — M25572 Pain in left ankle and joints of left foot: Secondary | ICD-10-CM

## 2021-04-11 DIAGNOSIS — R600 Localized edema: Secondary | ICD-10-CM | POA: Diagnosis not present

## 2021-04-11 DIAGNOSIS — M19072 Primary osteoarthritis, left ankle and foot: Secondary | ICD-10-CM | POA: Diagnosis not present

## 2021-04-11 DIAGNOSIS — M7989 Other specified soft tissue disorders: Secondary | ICD-10-CM | POA: Diagnosis not present

## 2021-04-11 DIAGNOSIS — R262 Difficulty in walking, not elsewhere classified: Secondary | ICD-10-CM | POA: Diagnosis not present

## 2021-04-13 ENCOUNTER — Other Ambulatory Visit: Payer: Self-pay

## 2021-04-13 ENCOUNTER — Encounter: Payer: Self-pay | Admitting: Gastroenterology

## 2021-04-20 ENCOUNTER — Ambulatory Visit (INDEPENDENT_AMBULATORY_CARE_PROVIDER_SITE_OTHER): Payer: 59 | Admitting: Orthopedic Surgery

## 2021-04-20 ENCOUNTER — Other Ambulatory Visit: Payer: Self-pay

## 2021-04-20 ENCOUNTER — Encounter: Payer: Self-pay | Admitting: Orthopedic Surgery

## 2021-04-20 DIAGNOSIS — M25572 Pain in left ankle and joints of left foot: Secondary | ICD-10-CM | POA: Diagnosis not present

## 2021-04-20 DIAGNOSIS — M25872 Other specified joint disorders, left ankle and foot: Secondary | ICD-10-CM | POA: Diagnosis not present

## 2021-04-20 NOTE — Progress Notes (Signed)
Office Visit Note   Patient: Kristy Travis           Date of Birth: 1965-09-27           MRN: [id number] Visit Date: 04/20/2021              Requested by: Kerri Perches, MD [address] [place],  [place] [address] PCP: Kerri Perches, MD  Chief Complaint  Patient presents with   Left Ankle - Follow-up      HPI: Patient is a 55 year old woman who presents in follow-up for anterior left ankle pain she is status post an MRI scan.  Patient states she had minimal improvement from an injection and had better relief from oral prednisone.  She states the pain is primarily over the anterior lateral joint line of her ankle.  Assessment & Plan: Visit Diagnoses:  1. Pain in left ankle and joints of left foot   2. Impingement of left ankle joint     Plan: Discussed that after failure of conservative treatment the next option would be to proceed with left ankle arthroscopy for debridement.  Risks and benefits were discussed including persistent pain.  Patient states she would like to proceed with arthroscopic intervention on a Tuesday with anticipated return to work on Monday.  Follow-Up Instructions: Return in about 2 weeks (around 05/04/2021).   Ortho Exam  Patient is alert, oriented, no adenopathy, well-dressed, normal affect, normal respiratory effort. Examination patient has a good dorsalis pedis pulse she has pain to palpation over the anterior lateral left ankle joint line.  The sinus Tarsi is nontender to palpation anterior drawer is stable she has no tenderness to palpation over the posterior tibial tendon or the peroneal tendons.  Inflammatory markers including CRP sed rate rheumatoid factor and UA were all normal.  ANA results have not returned.  Review of the MRI scan shows some mild tenosynovitis of the peroneal tendons with attenuation of the anterior talofibular ligament.  There are no osteochondral defects.  Imaging: No results found. No images  are attached to the encounter.  Labs: Lab Results  Component Value Date   HGBA1C 5.8 (H) 01/22/2021   HGBA1C 5.6 01/17/2020   HGBA1C 5.6 01/17/2020   HGBA1C 5.6 (A) 01/17/2020   HGBA1C 5.6 01/17/2020   ESRSEDRATE 17 02/23/2021   ESRSEDRATE 18 09/04/2010   ESRSEDRATE 20 07/18/2007   CRP 3.0 02/23/2021   LABURIC 5.2 02/23/2021   REPTSTATUS 11/30/2020 FINAL 11/27/2020   CULT  11/27/2020    NO GROUP A STREP (S.PYOGENES) ISOLATED Performed at [facility], [address], [place], [place] [address]      Lab Results  Component Value Date   ALBUMIN 4.4 01/22/2021   ALBUMIN 4.4 11/14/2018   ALBUMIN 4.5 01/26/2018    Lab Results  Component Value Date   MG 1.8 10/03/2013   MG 2.0 08/16/2013   Lab Results  Component Value Date   VD25OH 20.6 (L) 01/22/2021   VD25OH 22 (L) 01/17/2020   VD25OH 35.9 11/14/2018    No results found for: PREALBUMIN CBC EXTENDED Latest Ref Rng & Units 01/22/2021 01/17/2020 11/14/2018  WBC 3.4 - 10.8 x10E3/uL 6.1 5.1 5.3  RBC 3.77 - 5.28 x10E6/uL 4.48 4.98 4.41  HGB 11.1 - 15.9 g/dL 30.1 60.1 09.3  HCT 23.5 - 46.6 % 36.9 40.5 37.1  PLT 150 - 450 x10E3/uL 250 261 259  NEUTROABS 1,500 - 7,800 cells/uL - - -  LYMPHSABS 850 -  3,900 cells/uL - - -     There is no height or weight on file to calculate BMI.  Orders:  No orders of the defined types were placed in this encounter.  No orders of the defined types were placed in this encounter.    Procedures: No procedures performed  Clinical Data: No additional findings.  ROS:  All other systems negative, except as noted in the HPI. Review of Systems  Objective: Vital Signs: LMP 06/24/2014   Specialty Comments:  No specialty comments available.  PMFS History: Patient Active Problem List   Diagnosis Date Noted   Chronic foot pain, left 01/22/2021   Diabetes mellitus screening 01/17/2020   Screening mammogram, encounter for 01/17/2020   Encounter for screening for malignant  neoplasm of colon 01/17/2020   Mass of soft tissue of chest 01/17/2020   Primary osteoarthritis of left knee 12/12/2017   GERD (gastroesophageal reflux disease) 04/06/2017   Chronic fatigue disorder 12/22/2016   Annual physical exam 12/30/2012   Vitamin D deficiency 08/27/2011   Hyperlipidemia 02/06/2010   Obesity 12/21/2007   Essential hypertension 12/21/2007   Past Medical History:  Diagnosis Date   Arthritis    BACK PAIN, LUMBAR 01/15/2008   Qualifier: Diagnosis of  By: Lodema Hong MD, Margaret     Carpal tunnel syndrome    Chronic fatigue    GERD (gastroesophageal reflux disease)    GERD (gastroesophageal reflux disease) 04/06/2017   Glucose intolerance (impaired glucose tolerance) 12/12/2017   Hypertension    per patient this was prior to weight loss surgery/cn 12/01/17   Kidney stone on right side 12/21/2016   Metabolic syndrome X 09/02/2010   Qualifier: Diagnosis of  By: Lillia Mountain LPN, Brandi     Need for shingles vaccine 12/11/2018   Obesity    Pancreatitis 07/2014   had  gall bladder removed , spent 1 week in hospital   PONV (postoperative nausea and vomiting)    Seasonal allergies 04/04/2012    Family History  Problem Relation Age of Onset   Thrombosis Mother    Diabetes Father    Pulmonary Hypertension Father    Breast cancer Neg Hx    Bladder Cancer Neg Hx    Kidney cancer Neg Hx     Past Surgical History:  Procedure Laterality Date   ABDOMINAL HYSTERECTOMY     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   CHOLECYSTECTOMY  07/2014   cyst removed from left foot and bone removed from left foot  April and June 2010   DILATION AND CURETTAGE OF UTERUS     ESOPHAGOGASTRODUODENOSCOPY N/A 03/03/2016   Procedure: ESOPHAGOGASTRODUODENOSCOPY (EGD);  Surgeon: Malissa Hippo, MD;  [facility]: [facility];  Service: Endoscopy;  Laterality: N/A;  3:00 - moved to 2:00 - Ann to notify pt   KNEE ARTHROSCOPY Right 12/12/2017   Procedure: ARTHROSCOPY KNEE, MEDIAL CHONDROPLASTY, LOOSE BODY  REMOVAL,PARTIAL LATERAL MENISCECTOMY;  Surgeon: Donato Heinz, MD;  Location: [facility];  Service: Orthopedics;  Laterality: Right;   KNEE ARTHROSCOPY Left 06/02/2018   Procedure: ARTHROSCOPY KNEE, MEDIAL CHONDROPLASTY, PARTIAL MEDIAL MENISCECTOMY;  Surgeon: Donato Heinz, MD;  Location: [facility];  Service: Orthopedics;  Laterality: Left;   laparoscopy to eval dyspepsia  2004   TOTAL VAGINAL HYSTERECTOMY  06/2014   with BSO   Social History   Occupational History   Not on file  Tobacco Use   Smoking status: Never   Smokeless tobacco: Never  Vaping Use   Vaping Use: Never used  Substance and Sexual Activity   Alcohol use: Not Currently    Comment: occasionally   Drug use: No   Sexual activity: Yes    Birth control/protection: Surgical

## 2021-05-01 ENCOUNTER — Encounter: Payer: Self-pay | Admitting: Gastroenterology

## 2021-05-01 ENCOUNTER — Ambulatory Visit: Payer: 59 | Admitting: Anesthesiology

## 2021-05-01 ENCOUNTER — Ambulatory Visit
Admission: RE | Admit: 2021-05-01 | Discharge: 2021-05-01 | Disposition: A | Discharge status: Home / Self Care | Payer: 59 | Source: Ambulatory Visit | Attending: Gastroenterology | Admitting: Gastroenterology

## 2021-05-01 ENCOUNTER — Other Ambulatory Visit: Payer: Self-pay

## 2021-05-01 ENCOUNTER — Encounter
Admission: RE | Disposition: A | Discharge status: Home / Self Care | Payer: Self-pay | Source: Ambulatory Visit | Attending: Gastroenterology

## 2021-05-01 DIAGNOSIS — Z9884 Bariatric surgery status: Secondary | ICD-10-CM | POA: Insufficient documentation

## 2021-05-01 DIAGNOSIS — Z1211 Encounter for screening for malignant neoplasm of colon: Secondary | ICD-10-CM

## 2021-05-01 DIAGNOSIS — Z6833 Body mass index (BMI) 33.0-33.9, adult: Secondary | ICD-10-CM | POA: Diagnosis not present

## 2021-05-01 DIAGNOSIS — Z79899 Other long term (current) drug therapy: Secondary | ICD-10-CM | POA: Insufficient documentation

## 2021-05-01 DIAGNOSIS — E669 Obesity, unspecified: Secondary | ICD-10-CM | POA: Insufficient documentation

## 2021-05-01 DIAGNOSIS — K219 Gastro-esophageal reflux disease without esophagitis: Secondary | ICD-10-CM | POA: Diagnosis not present

## 2021-05-01 DIAGNOSIS — K635 Polyp of colon: Secondary | ICD-10-CM

## 2021-05-01 DIAGNOSIS — K449 Diaphragmatic hernia without obstruction or gangrene: Secondary | ICD-10-CM | POA: Insufficient documentation

## 2021-05-01 HISTORY — PX: POLYPECTOMY: SHX5525

## 2021-05-01 HISTORY — PX: COLONOSCOPY WITH PROPOFOL: SHX5780

## 2021-05-01 HISTORY — PX: ESOPHAGOGASTRODUODENOSCOPY (EGD) WITH PROPOFOL: SHX5813

## 2021-05-01 SURGERY — COLONOSCOPY WITH PROPOFOL
Anesthesia: General | Site: Rectum

## 2021-05-01 MED ORDER — LIDOCAINE HCL (CARDIAC) PF 100 MG/5ML IV SOSY
PREFILLED_SYRINGE | INTRAVENOUS | Status: DC | PRN
  Administered 2021-05-01: 50 mg via INTRAVENOUS

## 2021-05-01 MED ORDER — SODIUM CHLORIDE 0.9 % IV SOLN: INTRAVENOUS | Status: DC

## 2021-05-01 MED ORDER — STERILE WATER FOR IRRIGATION IR SOLN
Status: DC | PRN
  Administered 2021-05-01: 1

## 2021-05-01 MED ORDER — PROPOFOL 10 MG/ML IV BOLUS
INTRAVENOUS | Status: DC | PRN
  Administered 2021-05-01: 20 mg via INTRAVENOUS
  Administered 2021-05-01: 40 mg via INTRAVENOUS
  Administered 2021-05-01 (×3): 20 mg via INTRAVENOUS
  Administered 2021-05-01: 100 mg via INTRAVENOUS
  Administered 2021-05-01 (×2): 20 mg via INTRAVENOUS
  Administered 2021-05-01: 40 mg via INTRAVENOUS
  Administered 2021-05-01: 20 mg via INTRAVENOUS
  Administered 2021-05-01: 30 mg via INTRAVENOUS

## 2021-05-01 MED ORDER — LACTATED RINGERS IV SOLN: INTRAVENOUS | Status: DC

## 2021-05-01 MED ORDER — GLYCOPYRROLATE 0.2 MG/ML IJ SOLN
INTRAMUSCULAR | Status: DC | PRN
  Administered 2021-05-01: .1 mg via INTRAVENOUS

## 2021-05-01 SURGICAL SUPPLY — 8 items
BLOCK BITE 60FR ADLT L/F GRN (MISCELLANEOUS) ×3 IMPLANT
FORCEPS BIOP RAD 4 LRG CAP 4 (CUTTING FORCEPS) ×3 IMPLANT
GOWN CVR UNV OPN BCK APRN NK (MISCELLANEOUS) ×4 IMPLANT
GOWN ISOL THUMB LOOP REG UNIV (MISCELLANEOUS) ×6
KIT PRC NS LF DISP ENDO (KITS) ×2 IMPLANT
KIT PROCEDURE OLYMPUS (KITS) ×3
MANIFOLD NEPTUNE II (INSTRUMENTS) ×3 IMPLANT
WATER STERILE IRR 250ML POUR (IV SOLUTION) ×3 IMPLANT

## 2021-05-01 NOTE — Anesthesia Postprocedure Evaluation (Signed)
Anesthesia Post Note  Patient: Kristy Travis  Procedure(s) Performed: COLONOSCOPY WITH BIOPSY (Rectum) ESOPHAGOGASTRODUODENOSCOPY (EGD) WITH PROPOFOL POLYPECTOMY (Rectum)     Patient location during evaluation: PACU Anesthesia Type: General Level of consciousness: awake and alert Pain management: pain level controlled Vital Signs Assessment: post-procedure vital signs reviewed and stable Respiratory status: spontaneous breathing Cardiovascular status: stable Anesthetic complications: no   No notable events documented.  Marvis Repress

## 2021-05-01 NOTE — Op Note (Signed)
[facility] Gastroenterology Patient Name: Kristy Travis Procedure Date: 05/01/2021 9:55 AM MRN: [id number] Account #: [id number] Date of Birth: 12/04/1965 Admit Type: Outpatient Age: 55 Room: [facility] OR ROOM 01 Gender: Female Note Status: Finalized Instrument Name: [id number] Procedure:             Upper GI endoscopy Indications:           Heartburn Providers:             Midge Minium MD, MD Referring MD:          Milus Mallick. Lodema Hong MD, MD (Referring MD) Medicines:             Propofol per Anesthesia Complications:         No immediate complications. Procedure:             Pre-Anesthesia Assessment:                        - Prior to the procedure, a History and Physical was                         performed, and patient medications and allergies were                         reviewed. The patient's tolerance of previous                         anesthesia was also reviewed. The risks and benefits                         of the procedure and the sedation options and risks                         were discussed with the patient. All questions were                         answered, and informed consent was obtained. Prior                         Anticoagulants: The patient has taken no previous                         anticoagulant or antiplatelet agents. ASA Grade                         Assessment: II - A patient with mild systemic disease.                         After reviewing the risks and benefits, the patient                         was deemed in satisfactory condition to undergo the                         procedure.                        After obtaining informed consent, the endoscope was  passed under direct vision. Throughout the procedure,                         the patient's blood pressure, pulse, and oxygen                         saturations were monitored continuously. The was                         introduced through the  mouth, and advanced to the                         second part of duodenum. The upper GI endoscopy was                         accomplished without difficulty. The patient tolerated                         the procedure well. Findings:      A medium-sized hiatal hernia was present.      Evidence of a sleeve gastrectomy was found in the entire examined       stomach.      The examined duodenum was normal. Impression:            - Medium-sized hiatal hernia.                        - A sleeve gastrectomy was found.                        - Normal examined duodenum.                        - No specimens collected. Recommendation:        - Discharge patient to home.                        - Resume previous diet.                        - Continue present medications. Procedure Code(s):     --- Professional ---                        [phone], Esophagogastroduodenoscopy, flexible,                         transoral; diagnostic, including collection of                         specimen(s) by brushing or washing, when performed                         (separate procedure) Diagnosis Code(s):     --- Professional ---                        R12, Heartburn CPT copyright 2019 [employer]. All rights reserved. The codes documented in this report are preliminary and upon coder review may  be revised to meet current compliance requirements. Midge Minium MD, MD 05/01/2021 10:07:44 AM This report has been signed electronically.  Number of Addenda: 0 Note Initiated On: 05/01/2021 9:55 AM Total Procedure Duration: 0 hours 1 minute 46 seconds  Estimated Blood Loss:  Estimated blood loss: none.      [facility]

## 2021-05-01 NOTE — Anesthesia Preprocedure Evaluation (Signed)
Anesthesia Evaluation  Patient identified by MRN, date of birth, ID band Patient awake    Reviewed: Allergy & Precautions, H&P , NPO status , Patient's Chart, lab work & pertinent test results  History of Anesthesia Complications (+) PONV and history of anesthetic complications  Airway Mallampati: II  TM Distance: >3 FB Neck ROM: full    Dental no notable dental hx.    Pulmonary neg pulmonary ROS,    Pulmonary exam normal        Cardiovascular hypertension, On Medications Normal cardiovascular exam Rhythm:regular Rate:Normal     Neuro/Psych negative neurological ROS  negative psych ROS   GI/Hepatic Neg liver ROS, Medicated,  Endo/Other    Renal/GU      Musculoskeletal   Abdominal   Peds  Hematology negative hematology ROS (+)   Anesthesia Other Findings   Reproductive/Obstetrics                             Anesthesia Physical Anesthesia Plan  ASA: 2  Anesthesia Plan: General   Post-op Pain Management:    Induction:   PONV Risk Score and Plan: 4 or greater and Propofol infusion and Treatment may vary due to age or medical condition  Airway Management Planned:   Additional Equipment:   Intra-op Plan:   Post-operative Plan:   Informed Consent: I have reviewed the patients History and Physical, chart, labs and discussed the procedure including the risks, benefits and alternatives for the proposed anesthesia with the patient or authorized representative who has indicated his/her understanding and acceptance.       Plan Discussed with:   Anesthesia Plan Comments:         Anesthesia Quick Evaluation

## 2021-05-01 NOTE — H&P (Signed)
Midge Minium, MD [facility] [address] [place], [place] [address] Phone:[phone] Fax : [phone]  Primary Care Physician:  Kerri Perches, MD Primary Gastroenterologist:  Dr. Servando Snare  Pre-Procedure History & Physical: HPI:  Kristy Travis is a 55 y.o. female is here for an endoscopy and colonoscopy.   Past Medical History:  Diagnosis Date   Arthritis    BACK PAIN, LUMBAR 01/15/2008   Qualifier: Diagnosis of  By: Lodema Hong MD, Margaret     Carpal tunnel syndrome    Chronic fatigue    GERD (gastroesophageal reflux disease)    GERD (gastroesophageal reflux disease) 04/06/2017   Glucose intolerance (impaired glucose tolerance) 12/12/2017   Hypertension    per patient this was prior to weight loss surgery/cn 12/01/17   Kidney stone on right side 12/21/2016   Metabolic syndrome X 09/02/2010   Qualifier: Diagnosis of  By: Lillia Mountain LPN, Brandi     Need for shingles vaccine 12/11/2018   Obesity    Pancreatitis 07/2014   had  gall bladder removed , spent 1 week in hospital   PONV (postoperative nausea and vomiting)    Seasonal allergies 04/04/2012    Past Surgical History:  Procedure Laterality Date   ABDOMINAL HYSTERECTOMY     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   CHOLECYSTECTOMY  07/2014   cyst removed from left foot and bone removed from left foot  April and June 2010   DILATION AND CURETTAGE OF UTERUS     ESOPHAGOGASTRODUODENOSCOPY N/A 03/03/2016   Procedure: ESOPHAGOGASTRODUODENOSCOPY (EGD);  Surgeon: Malissa Hippo, MD;  Location: [facility];  Service: Endoscopy;  Laterality: N/A;  3:00 - moved to 2:00 - Ann to notify pt   KNEE ARTHROSCOPY Right 12/12/2017   Procedure: ARTHROSCOPY KNEE, MEDIAL CHONDROPLASTY, LOOSE BODY REMOVAL,PARTIAL LATERAL MENISCECTOMY;  Surgeon: Donato Heinz, MD;  Location: [facility] ORS;  Service: Orthopedics;  Laterality: Right;   KNEE ARTHROSCOPY Left 06/02/2018   Procedure: ARTHROSCOPY KNEE, MEDIAL CHONDROPLASTY, PARTIAL MEDIAL  MENISCECTOMY;  Surgeon: Donato Heinz, MD;  Location: [facility] ORS;  Service: Orthopedics;  Laterality: Left;   laparoscopy to eval dyspepsia  2004   TOTAL VAGINAL HYSTERECTOMY  06/2014   with BSO    Prior to Admission medications   Medication Sig Start Date End Date Taking? Authorizing Provider  dexlansoprazole (DEXILANT) 60 MG capsule Take 1 capsule (60 mg total) by mouth daily. 03/11/21  Yes Midge Minium, MD  ergocalciferol (VITAMIN D2) 1.25 MG (50000 UT) capsule Take 1 capsule (50,000 Units total) by mouth once a week. One capsule once weekly 01/23/21  Yes Kerri Perches, MD  diclofenac sodium (VOLTAREN) 1 % GEL Apply 2 g topically 4 (four) times daily. For knees Patient not taking: Reported on 05/01/2021    [provider]  omeprazole (PRILOSEC) 20 MG capsule TAKE 1 CAPSULE BY MOUTH TWICE DAILY BEFORE A MEAL. Patient not taking: Reported on 04/13/2021 11/26/20 11/26/21  Kerri Perches, MD  predniSONE (DELTASONE) 10 MG tablet Take 1 tablet (10 mg total) by mouth daily with breakfast. Patient not taking: Reported on 04/13/2021 02/23/21   Nadara Mustard, MD  rosuvastatin (CRESTOR) 5 MG tablet Take 1 tablet (5 mg total) by mouth daily. Patient not taking: Reported on 05/01/2021 01/22/20   Freddy Finner, NP    Allergies as of 03/11/2021   (No Active Allergies)    Family History  Problem Relation Age of Onset   Thrombosis Mother    Diabetes Father  Pulmonary Hypertension Father    Breast cancer Neg Hx    Bladder Cancer Neg Hx    Kidney cancer Neg Hx     Social History   Socioeconomic History   Marital status: Married    Spouse name: Not on file   Number of children: Not on file   Years of education: Not on file   Highest education level: Not on file  Occupational History   Not on file  Tobacco Use   Smoking status: Never   Smokeless tobacco: Never  Vaping Use   Vaping Use: Never used  Substance and Sexual Activity   Alcohol use: Not Currently    Comment:  occasionally   Drug use: No   Sexual activity: Yes    Birth control/protection: Surgical  Other Topics Concern   Not on file  Social History Narrative   Not on file   Social Determinants of Health   Financial Resource Strain: Not on file  Food Insecurity: Not on file  Transportation Needs: Not on file  Physical Activity: Not on file  Stress: Not on file  Social Connections: Not on file  Intimate Partner Violence: Not on file    Review of Systems: See HPI, otherwise negative ROS  Physical Exam: BP 118/76   Pulse 79   Temp 97.6 F (36.4 C) (Temporal)   Ht 5\' 5"  (1.651 m)   Wt 91 kg   LMP 06/24/2014   SpO2 100%   BMI 33.40 kg/m  General:   Alert,  pleasant and cooperative in NAD Head:  Normocephalic and atraumatic. Neck:  Supple; no masses or thyromegaly. Lungs:  Clear throughout to auscultation.    Heart:  Regular rate and rhythm. Abdomen:  Soft, nontender and nondistended. Normal bowel sounds, without guarding, and without rebound.   Neurologic:  Alert and  oriented x4;  grossly normal neurologically.  Impression/Plan: Kristy Travis is here for an endoscopy and colonoscopy to be performed for GERD and screening  Risks, benefits, limitations, and alternatives regarding  endoscopy and colonoscopy have been reviewed with the patient.  Questions have been answered.  All parties agreeable.   Jerry Caras, MD  05/01/2021, 9:27 AM

## 2021-05-01 NOTE — Op Note (Signed)
[facility] Gastroenterology Patient Name: Kristy Travis Procedure Date: 05/01/2021 9:54 AM MRN: [id number] Account #: [id number] Date of Birth: Feb 03, 1966 Admit Type: Outpatient Age: 55 Room: [facility] OR ROOM 01 Gender: Female Note Status: Finalized Instrument Name: [id number] Procedure:             Colonoscopy Indications:           Screening for colorectal malignant neoplasm Providers:             Midge Minium MD, MD Referring MD:          Milus Mallick. Lodema Hong MD, MD (Referring MD) Medicines:             Propofol per Anesthesia Complications:         No immediate complications. Procedure:             Pre-Anesthesia Assessment:                        - Prior to the procedure, a History and Physical was                         performed, and patient medications and allergies were                         reviewed. The patient's tolerance of previous                         anesthesia was also reviewed. The risks and benefits                         of the procedure and the sedation options and risks                         were discussed with the patient. All questions were                         answered, and informed consent was obtained. Prior                         Anticoagulants: The patient has taken no previous                         anticoagulant or antiplatelet agents. ASA Grade                         Assessment: II - A patient with mild systemic disease.                         After reviewing the risks and benefits, the patient                         was deemed in satisfactory condition to undergo the                         procedure.                        After obtaining informed consent, the colonoscope was  passed under direct vision. Throughout the procedure,                         the patient's blood pressure, pulse, and oxygen                         saturations were monitored continuously. The                          Colonoscope was introduced through the anus and                         advanced to the the cecum, identified by appendiceal                         orifice and ileocecal valve. The colonoscopy was                         performed without difficulty. The patient tolerated                         the procedure well. The quality of the bowel                         preparation was excellent. Findings:      The perianal and digital rectal examinations were normal.      A 1 mm polyp was found in the descending colon. The polyp was sessile.       The polyp was removed with a cold biopsy forceps. Resection and       retrieval were complete. Impression:            - One 1 mm polyp in the descending colon, removed with                         a cold biopsy forceps. Resected and retrieved. Recommendation:        - Discharge patient to home.                        - Resume previous diet.                        - Continue present medications.                        - Await pathology results.                        - If the pathology report reveals adenomatous tissue,                         then repeat the colonoscopy for surveillance in 7                         years. Procedure Code(s):     --- Professional ---                        [phone], Colonoscopy, flexible; with biopsy, single or  multiple Diagnosis Code(s):     --- Professional ---                        Z12.11, Encounter for screening for malignant neoplasm                         of colon                        K63.5, Polyp of colon CPT copyright 2019 American Medical Association. All rights reserved. The codes documented in this report are preliminary and upon coder review may  be revised to meet current compliance requirements. Midge Minium MD, MD 05/01/2021 10:21:15 AM This report has been signed electronically. Number of Addenda: 0 Note Initiated On: 05/01/2021 9:54 AM Scope Withdrawal Time: 0 hours 7  minutes 31 seconds  Total Procedure Duration: 0 hours 11 minutes 11 seconds  Estimated Blood Loss:  Estimated blood loss: none.      [facility]

## 2021-05-01 NOTE — Anesthesia Procedure Notes (Signed)
Date/Time: 05/01/2021 10:01 AM Performed by: Jimmy Picket, CRNA Pre-anesthesia Checklist: Patient identified, Emergency Drugs available, Suction available, Timeout performed and Patient being monitored Patient Re-evaluated:Patient Re-evaluated prior to induction Oxygen Delivery Method: Nasal cannula Placement Confirmation: positive ETCO2

## 2021-05-01 NOTE — Transfer of Care (Signed)
Immediate Anesthesia Transfer of Care Note  Patient: Kristy Travis  Procedure(s) Performed: COLONOSCOPY WITH BIOPSY (Rectum) ESOPHAGOGASTRODUODENOSCOPY (EGD) WITH PROPOFOL POLYPECTOMY (Rectum)  Patient Location: PACU  Anesthesia Type: General  Level of Consciousness: awake, alert  and patient cooperative  Airway and Oxygen Therapy: Patient Spontanous Breathing and Patient connected to supplemental oxygen  Post-op Assessment: Post-op Vital signs reviewed, Patient's Cardiovascular Status Stable, Respiratory Function Stable, Patent Airway and No signs of Nausea or vomiting  Post-op Vital Signs: Reviewed and stable  Complications: No notable events documented.

## 2021-05-04 ENCOUNTER — Encounter: Payer: Self-pay | Admitting: Gastroenterology

## 2021-05-04 LAB — SURGICAL PATHOLOGY

## 2021-05-06 ENCOUNTER — Encounter: Payer: Self-pay | Admitting: Gastroenterology

## 2021-05-21 ENCOUNTER — Other Ambulatory Visit: Payer: Self-pay

## 2021-05-21 ENCOUNTER — Ambulatory Visit (INDEPENDENT_AMBULATORY_CARE_PROVIDER_SITE_OTHER): Payer: 59 | Admitting: Family Medicine

## 2021-05-21 ENCOUNTER — Encounter: Payer: Self-pay | Admitting: Family Medicine

## 2021-05-21 VITALS — BP 132/83 | HR 74 | Resp 17 | Ht 65.0 in | Wt 204.0 lb

## 2021-05-21 DIAGNOSIS — M79672 Pain in left foot: Secondary | ICD-10-CM | POA: Diagnosis not present

## 2021-05-21 DIAGNOSIS — E7849 Other hyperlipidemia: Secondary | ICD-10-CM

## 2021-05-21 DIAGNOSIS — E6609 Other obesity due to excess calories: Secondary | ICD-10-CM | POA: Diagnosis not present

## 2021-05-21 DIAGNOSIS — N3001 Acute cystitis with hematuria: Secondary | ICD-10-CM

## 2021-05-21 DIAGNOSIS — G8929 Other chronic pain: Secondary | ICD-10-CM

## 2021-05-21 DIAGNOSIS — Z6834 Body mass index (BMI) 34.0-34.9, adult: Secondary | ICD-10-CM

## 2021-05-21 DIAGNOSIS — R7303 Prediabetes: Secondary | ICD-10-CM | POA: Diagnosis not present

## 2021-05-21 DIAGNOSIS — G9332 Myalgic encephalomyelitis/chronic fatigue syndrome: Secondary | ICD-10-CM | POA: Diagnosis not present

## 2021-05-21 DIAGNOSIS — E559 Vitamin D deficiency, unspecified: Secondary | ICD-10-CM | POA: Diagnosis not present

## 2021-05-21 DIAGNOSIS — I10 Essential (primary) hypertension: Secondary | ICD-10-CM

## 2021-05-21 DIAGNOSIS — Z131 Encounter for screening for diabetes mellitus: Secondary | ICD-10-CM

## 2021-05-21 DIAGNOSIS — M2550 Pain in unspecified joint: Secondary | ICD-10-CM

## 2021-05-21 DIAGNOSIS — N3000 Acute cystitis without hematuria: Secondary | ICD-10-CM

## 2021-05-21 DIAGNOSIS — R42 Dizziness and giddiness: Secondary | ICD-10-CM

## 2021-05-21 LAB — POCT URINALYSIS DIP (CLINITEK)
Bilirubin, UA: NEGATIVE
Glucose, UA: NEGATIVE mg/dL
Ketones, POC UA: NEGATIVE mg/dL
Leukocytes, UA: NEGATIVE
Nitrite, UA: NEGATIVE
POC PROTEIN,UA: NEGATIVE
Spec Grav, UA: >=1.03 — AB (ref 1.010–1.025)
Urobilinogen, UA: 0.2 E.U./dL
pH, UA: 6 (ref 5.0–8.0)

## 2021-05-21 MED ORDER — METHYLPREDNISOLONE ACETATE 80 MG/ML IJ SUSP
80.0000 mg | Freq: Once | INTRAMUSCULAR | Status: AC
  Administered 2021-05-21: 80 mg via INTRAMUSCULAR

## 2021-05-21 MED ORDER — PREDNISONE 5 MG PO TABS
5.0000 mg | ORAL_TABLET | ORAL | 0 refills | Status: DC
  Filled 2021-05-21: qty 21, 6d supply, fill #0

## 2021-05-21 MED ORDER — DEXLANSOPRAZOLE 60 MG PO CPDR
60.0000 mg | DELAYED_RELEASE_CAPSULE | Freq: Every day | ORAL | 2 refills | Status: DC
  Filled 2021-05-21: qty 90, 90d supply, fill #0

## 2021-05-21 NOTE — Patient Instructions (Addendum)
F/U in March, call if you need me before  You are referred urgently to Rheumatology we will call with an appointment   Prednisone is prescribed for 6 days for joint pain  Depo medrol 80 mg IM in office today  Labs today that were ordered 7/19, add on antibodies to double stranded dNA please  .Thanks for choosing [facility], we consider it a privelige to serve you.

## 2021-05-21 NOTE — Progress Notes (Signed)
Kristy Travis     MRN: [id number]      DOB: 1966-02-05   HPI Kristy Travis is here for follow up and re-evaluation of chronic medical conditions, medication management and review of any available recent lab and radiology data.  Preventive health is updated, specifically  Cancer screening and Immunization.   Seen  by ortho, no ankle /bony problem identified  Now debilitating joint pains ,both ankles, soles of feet, both shoulders, fingers and knees, also has sun senstivity  Neeeds Rheumatology eval. The PT denies any adverse reactions to current medications since the last visit.   ROS Denies recent fever or chills. Denies sinus pressure, nasal congestion, ear pain or sore throat. Denies chest congestion, productive cough or wheezing. Denies chest pains, palpitations and leg swelling Denies abdominal pain, nausea, vomiting,diarrhea or constipation.   C/o mild dysuria and fequeency. . Denies headaches, seizures, numbness, or tingling. Denies depression, anxiety or insomnia.Severe chronic fatigue C/o sun sensitivity, no current rash C/o dizziness x 2 weeks, no preceeding URI symptoms, improved  PE  BP 132/83   Pulse 74   Resp 17   Ht 5\' 5"  (1.651 m)   Wt 204 lb (92.5 kg)   LMP 06/24/2014   SpO2 96%   BMI 33.95 kg/m   Patient alert and oriented and in no cardiopulmonary distress.  HEENT: No facial asymmetry, EOMI,     Neck supple .  Chest: Clear to auscultation bilaterally.  CVS: S1, S2 no murmurs, no S3.Regular rate.  ABD: Soft non tender.   Ext: No edema  MS: decreased  ROM spine, shoulders, hips and knees.Multiple tender points  Skin: Intact, no ulcerations or rash noted.  Psych: Good eye contact, normal affect. Memory intact not anxious or depressed appearing.  CNS: CN 2-12 intact, power,  normal throughout.no focal deficits noted.   Assessment & Plan  Chronic foot pain, left worsening , no underlying bone / joint pathology after extensive  Ortho eval, now experiencing significant pain in multiple joints refer to rheumatology  Obesity  Patient re-educated about  the importance of commitment to a  minimum of 150 minutes of exercise per week as able.  The importance of healthy food choices with portion control discussed, as well as eating regularly and within a 12 hour window most days. The need to choose "clean , green" food 50 to 75% of the time is discussed, as well as to make water the primary drink and set a goal of 64 ounces water daily.    Weight /BMI 05/21/2021 05/01/2021 03/11/2021  WEIGHT 204 lb 200 lb 11.2 oz 206 lb 9.6 oz  HEIGHT 5\' 5"  5\' 5"  5\' 5"   BMI 33.95 kg/m2 33.4 kg/m2 34.38 kg/m2      Chronic fatigue disorder progressively worsening, has negarive sleep stiudy, will consider stimulant , need Rheumatology eval  Acute cystitis with hematuria Symptomatic with abn UA, send for c/s  Hyperlipidemia Hyperlipidemia:Low fat diet discussed and encouraged.   Lipid Panel  Lab Results  Component Value Date   CHOL 277 (H) 05/21/2021   HDL 84 05/21/2021   LDLCALC 179 (H) 05/21/2021   TRIG 88 05/21/2021   CHOLHDL 3.3 05/21/2021     Needs to reduce fried and fatty foods  Prediabetes Patient educated about the importance of limiting  Carbohydrate intake , the need to commit to daily physical activity for a minimum of 30 minutes , and to commit weight loss. The fact that changes in all these areas will reduce or eliminate  all together the development of diabetes is stressed.  Deteriorated  Diabetic Labs Latest Ref Rng & Units 05/21/2021 01/22/2021 01/17/2020 01/17/2020 01/17/2020  HbA1c 4.8 - 5.6 % 6.1(H) 5.8(H) 5.6(A) 5.6 5.6  Chol 100 - 199 mg/dL 277(H) 251(H) 243(H) - -  HDL >39 mg/dL 84 74 76 - -  Calc LDL 0 - 99 mg/dL 179(H) 159(H) 147(H) - -  Triglycerides 0 - 149 mg/dL 88 105 95 - -  Creatinine 0.57 - 1.00 mg/dL 0.65 0.67 0.56 - -   BP/Weight 05/21/2021 05/01/2021 03/11/2021 01/22/2021 11/27/2020 05/15/2020  [id number]  Systolic BP [id number]  Diastolic BP 83 69 80 75 78 - 81  Wt. (Lbs) 204 200.7 206.6 205 - 195 196.8  BMI 33.95 33.4 34.38 34.11 - 32.45 32.75   No flowsheet data found.    Pain in joint, multiple sites Increasingly disabling generalized joint pain and chromic fatigue with ender points and sun sensitivity, reer rheumatology  Dizziness 2 week history, improving symptoms, meclizine as needed short term, no nystagmus , not debiliatating currently

## 2021-05-22 ENCOUNTER — Encounter: Payer: Self-pay | Admitting: Family Medicine

## 2021-05-23 LAB — CMP14+EGFR
ALT: 12 IU/L (ref 0–32)
AST: 16 IU/L (ref 0–40)
Albumin/Globulin Ratio: 2.1 (ref 1.2–2.2)
Albumin: 4.6 g/dL (ref 3.8–4.9)
Alkaline Phosphatase: 89 IU/L (ref 44–121)
BUN/Creatinine Ratio: 18 (ref 9–23)
BUN: 12 mg/dL (ref 6–24)
Bilirubin Total: 0.3 mg/dL (ref 0.0–1.2)
CO2: 26 mmol/L (ref 20–29)
Calcium: 9.7 mg/dL (ref 8.7–10.2)
Chloride: 106 mmol/L (ref 96–106)
Creatinine, Ser: 0.65 mg/dL (ref 0.57–1.00)
Globulin, Total: 2.2 g/dL (ref 1.5–4.5)
Glucose: 95 mg/dL (ref 70–99)
Potassium: 4.4 mmol/L (ref 3.5–5.2)
Sodium: 146 mmol/L — ABNORMAL HIGH (ref 134–144)
Total Protein: 6.8 g/dL (ref 6.0–8.5)
eGFR: 105 mL/min/{1.73_m2} (ref >59)

## 2021-05-23 LAB — LIPID PANEL
Chol/HDL Ratio: 3.3 ratio (ref 0.0–4.4)
Cholesterol, Total: 277 mg/dL — ABNORMAL HIGH (ref 100–199)
HDL: 84 mg/dL (ref >39)
LDL Chol Calc (NIH): 179 mg/dL — ABNORMAL HIGH (ref 0–99)
Triglycerides: 88 mg/dL (ref 0–149)
VLDL Cholesterol Cal: 14 mg/dL (ref 5–40)

## 2021-05-23 LAB — HEMOGLOBIN A1C
Est. average glucose Bld gHb Est-mCnc: 128 mg/dL
Hgb A1c MFr Bld: 6.1 % — ABNORMAL HIGH (ref 4.8–5.6)

## 2021-05-23 LAB — VITAMIN D 25 HYDROXY (VIT D DEFICIENCY, FRACTURES): Vit D, 25-Hydroxy: 38.2 ng/mL (ref 30.0–100.0)

## 2021-05-23 LAB — ANTI-DNA ANTIBODY, DOUBLE-STRANDED: dsDNA Ab: <1 IU/mL (ref 0–9)

## 2021-05-24 ENCOUNTER — Encounter: Payer: Self-pay | Admitting: Family Medicine

## 2021-05-24 DIAGNOSIS — N3001 Acute cystitis with hematuria: Secondary | ICD-10-CM | POA: Insufficient documentation

## 2021-05-24 DIAGNOSIS — R42 Dizziness and giddiness: Secondary | ICD-10-CM | POA: Insufficient documentation

## 2021-05-24 DIAGNOSIS — M25549 Pain in joints of unspecified hand: Secondary | ICD-10-CM | POA: Insufficient documentation

## 2021-05-24 DIAGNOSIS — M2550 Pain in unspecified joint: Secondary | ICD-10-CM | POA: Insufficient documentation

## 2021-05-24 MED ORDER — MECLIZINE HCL 25 MG PO TABS
25.0000 mg | ORAL_TABLET | Freq: Two times a day (BID) | ORAL | 0 refills | Status: DC | PRN
  Filled 2021-05-24: qty 20, 10d supply, fill #0

## 2021-05-24 NOTE — Assessment & Plan Note (Signed)
progressively worsening, has negarive sleep stiudy, will consider stimulant , need Rheumatology eval

## 2021-05-24 NOTE — Assessment & Plan Note (Signed)
2 week history, improving symptoms, meclizine as needed short term, no nystagmus , not debiliatating currently

## 2021-05-24 NOTE — Assessment & Plan Note (Signed)
Hyperlipidemia:Low fat diet discussed and encouraged.   Lipid Panel  Lab Results  Component Value Date   CHOL 277 (H) 05/21/2021   HDL 84 05/21/2021   LDLCALC 179 (H) 05/21/2021   TRIG 88 05/21/2021   CHOLHDL 3.3 05/21/2021     Needs to reduce fried and fatty foods

## 2021-05-24 NOTE — Assessment & Plan Note (Signed)
Symptomatic with abn UA, send for c/s

## 2021-05-24 NOTE — Assessment & Plan Note (Signed)
worsening , no underlying bone / joint pathology after extensive Ortho eval, now experiencing significant pain in multiple joints refer to rheumatology

## 2021-05-24 NOTE — Assessment & Plan Note (Addendum)
Patient educated about the importance of limiting  Carbohydrate intake , the need to commit to daily physical activity for a minimum of 30 minutes , and to commit weight loss. The fact that changes in all these areas will reduce or eliminate all together the development of diabetes is stressed.  Deteriorated  Diabetic Labs Latest Ref Rng & Units 05/21/2021 01/22/2021 01/17/2020 01/17/2020 01/17/2020  HbA1c 4.8 - 5.6 % 6.1(H) 5.8(H) 5.6(A) 5.6 5.6  Chol 100 - 199 mg/dL 160(F) 093(A) 355(D) - -  HDL >39 mg/dL 84 74 76 - -  Calc LDL 0 - 99 mg/dL 322(G) 254(Y) 706(C) - -  Triglycerides 0 - 149 mg/dL 88 376 95 - -  Creatinine 0.57 - 1.00 mg/dL 2.83 1.51 7.61 - -   BP/Weight 05/21/2021 05/01/2021 03/11/2021 01/22/2021 11/27/2020 05/15/2020 04/18/2020  Systolic BP 132 111 123 113 114 - 124  Diastolic BP 83 69 80 75 78 - 81  Wt. (Lbs) 204 200.7 206.6 205 - 195 196.8  BMI 33.95 33.4 34.38 34.11 - 32.45 32.75   No flowsheet data found.

## 2021-05-24 NOTE — Assessment & Plan Note (Signed)
  Patient re-educated about  the importance of commitment to a  minimum of 150 minutes of exercise per week as able.  The importance of healthy food choices with portion control discussed, as well as eating regularly and within a 12 hour window most days. The need to choose "clean , green" food 50 to 75% of the time is discussed, as well as to make water the primary drink and set a goal of 64 ounces water daily.    Weight /BMI 05/21/2021 05/01/2021 03/11/2021  WEIGHT 204 lb 200 lb 11.2 oz 206 lb 9.6 oz  HEIGHT 5\' 5"  5\' 5"  5\' 5"   BMI 33.95 kg/m2 33.4 kg/m2 34.38 kg/m2

## 2021-05-24 NOTE — Assessment & Plan Note (Addendum)
Increasingly disabling generalized joint pain and chromic fatigue with ender points and sun sensitivity, reer rheumatology IM depo medrol and short course of prednisone

## 2021-05-25 ENCOUNTER — Other Ambulatory Visit: Payer: Self-pay

## 2021-05-27 LAB — URINE CULTURE

## 2021-06-01 NOTE — Progress Notes (Signed)
Office Visit Note  Patient: Kristy Travis             Date of Birth: September 09, 1965           MRN: [id number]             PCP: Fayrene Helper, MD Referring: Fayrene Helper, MD Visit Date: 06/02/2021 Occupation: [facility]  Subjective:  New Patient (Initial Visit) (Bil ankle swelling, bil ankle and bil foot pain, bil hand pain and swelling, right shoulder pain)   History of Present Illness: Kristy Travis is a 55 y.o. female here for diffuse joint pains and some history of sun sensitivity. She has problems primarily in bilateral feet and ankles this is ongoing for a few years but she mostly complains about issues limiting her mobility and function since earlier this year. Worst area is in the left foot she previously saw Dr. Amalia Hailey with podiatry for this with trial of steroid injections and immobilization boot without great results. More recently saw Dr. Sharol Given for this obtained MRI of the left ankle showing some tenosynovitis and mild degenerative changes. Lab testing including ANA, RF, and inflammatory markers was negative in August. During the past few months and especially for a few weeks having some increased symptoms elsewhere with right shoulder and bilateral hand pains. She also describes some sun sensitivity with skin breaking out in darker coloration on her face and forearms when out in the sun since earlier this year. Normally itchy otherwise unremarkable. Currently she is having burning type sensation on the right cheek without a visible change. She feels very fatigued, with frequent nighttime sleep intrttuption due to her foot and ankle pain. She denies fevers, weight loss, lymphadenopathy, raynaud's, oral ulcers, alopecia, or history of blood clots.   Labs reviewed 05/2021 dsDNA neg  02/2021 RF neg Uric acid 5.2 ESR 17 CRP 3  Imaging reviewed 04/13/21 MRI left ankle IMPRESSION: 1. Moderate peroneus longus tenosynovitis with reactive edema in the peroneal  tubercle. No tendon tear or subluxation identified. 2. The additional ankle tendons are intact. 3. Mildly attenuated anterior talofibular ligament without acute ligamentous findings. 4. No acute osseous findings or significant arthropathic changes in the hindfoot. Mild degenerative changes at the 1st MTP joint.   05/09/18 MRI left knee IMPRESSION: 1. Radial tear of the posterior horn of the medial meniscus adjacent to the meniscal root with peripheral meniscal extrusion. 2. Mild partial-thickness cartilage loss of the medial femorotibial compartment.  10/14/17 MRI right knee IMPRESSION: Complex tear anterior horn lateral meniscus has both longitudinal and horizontal components. Fraying along the free edge of the body of the medial meniscus. No focal tear. 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 Baker's cyst.   Activities of Daily Living:  Patient reports morning stiffness for 1 hour.   Patient Reports nocturnal pain.  Difficulty dressing/grooming: Denies Difficulty climbing stairs: Reports Difficulty getting out of chair: Reports Difficulty using hands for taps, buttons, cutlery, and/or writing: Reports  Review of Systems  Constitutional:  Positive for fatigue.  HENT:  Positive for mouth dryness.   Eyes:  Negative for dryness.  Respiratory:  Negative for shortness of breath.   Cardiovascular:  Positive for swelling in legs/feet.  Gastrointestinal:  Positive for constipation.  Endocrine: Positive for cold intolerance, excessive thirst and increased urination.  Genitourinary:  Negative for difficulty urinating.  Musculoskeletal:  Positive for joint pain, joint pain, joint swelling, muscle weakness, morning stiffness and muscle tenderness.  Skin:  Positive for rash.  Allergic/Immunologic: Positive for susceptible to infections.  Neurological:  Positive for numbness and weakness.  Hematological:  Positive for bruising/bleeding  tendency.  Psychiatric/Behavioral:  Positive for sleep disturbance.    PMFS History:  Patient Active Problem List   Diagnosis Date Noted   Acute cystitis with hematuria 05/24/2021   Dizziness 05/24/2021   Polyp of transverse colon    Chronic foot pain, left 01/22/2021   Prediabetes 01/17/2020   Mass of soft tissue of chest 01/17/2020   Primary osteoarthritis of left knee 12/12/2017   GERD (gastroesophageal reflux disease) 04/06/2017   Chronic fatigue disorder 12/22/2016   Vitamin D deficiency 08/27/2011   Pain in joint, multiple sites 09/02/2010   Hyperlipidemia 02/06/2010    Past Medical History:  Diagnosis Date   Arthritis    BACK PAIN, LUMBAR 01/15/2008   Qualifier: Diagnosis of  By: Moshe Cipro MD, Margaret     Carpal tunnel syndrome    Chronic fatigue    GERD (gastroesophageal reflux disease)    GERD (gastroesophageal reflux disease) 04/06/2017   Glucose intolerance (impaired glucose tolerance) 12/12/2017   Hypertension    per patient this was prior to weight loss surgery/cn 12/01/17   Kidney stone on right side 0/24/0973   Metabolic syndrome X 5/32/9924   Qualifier: Diagnosis of  By: Cori Razor LPN, Brandi     Need for shingles vaccine 12/11/2018   Obesity    Pancreatitis 07/2014   had  gall bladder removed , spent 1 week in hospital   PONV (postoperative nausea and vomiting)    Seasonal allergies 04/04/2012    Family History  Problem Relation Age of Onset   Thrombosis Mother    Diabetes Father    Pulmonary Hypertension Father    Breast cancer Neg Hx    Bladder Cancer Neg Hx    Kidney cancer Neg Hx    Past Surgical History:  Procedure Laterality Date   ABDOMINAL HYSTERECTOMY     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   CHOLECYSTECTOMY  07/2014   COLONOSCOPY WITH PROPOFOL N/A 05/01/2021   Procedure: COLONOSCOPY WITH BIOPSY;  Surgeon: Lucilla Lame, MD;  Location: [facility];  Service: Endoscopy;  Laterality: N/A;   cyst removed from left foot and bone removed  from left foot  April and June 2010   DILATION AND CURETTAGE OF UTERUS     ESOPHAGOGASTRODUODENOSCOPY N/A 03/03/2016   Procedure: ESOPHAGOGASTRODUODENOSCOPY (EGD);  Surgeon: Rogene Houston, MD;  [facility];  Service: Endoscopy;  Laterality: N/A;  3:00 - moved to 2:00 - Ann to notify pt   ESOPHAGOGASTRODUODENOSCOPY (EGD) WITH PROPOFOL N/A 05/01/2021   Procedure: ESOPHAGOGASTRODUODENOSCOPY (EGD) WITH PROPOFOL;  Surgeon: Lucilla Lame, MD;  Location: [place];  Service: Endoscopy;  Laterality: N/A;   KNEE ARTHROSCOPY Right 12/12/2017   Procedure: ARTHROSCOPY KNEE, MEDIAL CHONDROPLASTY, LOOSE BODY REMOVAL,PARTIAL LATERAL MENISCECTOMY;  Surgeon: Dereck Leep, MD;  Location: [facility];  Service: Orthopedics;  Laterality: Right;   KNEE ARTHROSCOPY Left 06/02/2018   Procedure: ARTHROSCOPY KNEE, MEDIAL CHONDROPLASTY, PARTIAL MEDIAL MENISCECTOMY;  Surgeon: Dereck Leep, MD;  Location: [facility];  Service: Orthopedics;  Laterality: Left;   laparoscopy to eval dyspepsia  2004   POLYPECTOMY N/A 05/01/2021   Procedure: POLYPECTOMY;  Surgeon: Lucilla Lame, MD;  Location: [place];  Service: Endoscopy;  Laterality: N/A;   TOTAL VAGINAL HYSTERECTOMY  06/2014   with BSO   Social History   Social History Narrative   Not on file  Immunization History  Administered Date(s) Administered   Influenza Whole 04/11/2016   Influenza, Seasonal, Injecte, Preservative Fre 05/13/2009   Influenza,inj,Quad PF,6+ Mos 03/20/2015, 03/31/2020   Influenza-Unspecified 04/01/2021   PFIZER(Purple Top)SARS-COV-2 Vaccination 07/09/2019, 07/30/2019, 04/09/2020   Td 01/21/2004   Tdap 02/18/2015   Zoster Recombinat (Shingrix) 12/11/2018, 04/24/2019     Objective: Vital Signs: BP 137/74 (BP Location: Right Arm, Patient Position: Sitting, Cuff Size: Normal)   Pulse 74   Resp 15   Ht _0  (1.651 m)   Wt 202 lb (91.6 kg)   LMP 06/24/2014   BMI 33.61 kg/m    Physical  Exam Constitutional:      Appearance: She is obese.  HENT:     Right Ear: External ear normal.     Left Ear: External ear normal.     Mouth/Throat:     Mouth: Mucous membranes are moist.     Pharynx: Oropharynx is clear.  Eyes:     Conjunctiva/sclera: Conjunctivae normal.  Cardiovascular:     Rate and Rhythm: Normal rate and regular rhythm.  Pulmonary:     Effort: Pulmonary effort is normal.     Breath sounds: Normal breath sounds.  Musculoskeletal:     Right lower leg: No edema.     Left lower leg: No edema.  Skin:    General: Skin is warm and dry.     Findings: No rash.     Comments: Scattered flat hyperpigmented spots on center of face and few on forearm extensor surfaces Normal appearing nailfold capillaroscopy  Neurological:     Mental Status: She is alert.     Motor: No weakness.     Deep Tendon Reflexes: Reflexes normal.  Psychiatric:        Mood and Affect: Mood normal.     Musculoskeletal Exam:  Neck full ROM no tenderness Shoulders full ROM no tenderness or swelling Elbows full ROM no tenderness or swelling Wrists full ROM no tenderness or swelling Fingers full ROM no tenderness or swelling Knees full ROM no tenderness or swelling, patellofemoral crepitus present b/l Ankles full ROM, tenderness to pressure over achilles tendon Some pain on plantar surface of midfoot and under MTPs no palpable swelling no erythema or warmth Limited ultrasound inspection of heels and MTPs no synovitis or other abnormal findings, besides known calcaneal spurring redemonstrated   Investigation: No additional findings.  Imaging: No results found.  Recent Labs: Lab Results  Component Value Date   WBC 6.1 01/22/2021   HGB 11.8 01/22/2021   PLT 250 01/22/2021   NA 146 (H) 05/21/2021   K 4.4 05/21/2021   CL 106 05/21/2021   CO2 26 05/21/2021   GLUCOSE 95 05/21/2021   BUN 12 05/21/2021   CREATININE 0.65 05/21/2021   BILITOT 0.3 05/21/2021   ALKPHOS 89 05/21/2021   AST  16 05/21/2021   ALT 12 05/21/2021   PROT 6.8 05/21/2021   ALBUMIN 4.6 05/21/2021   CALCIUM 9.7 05/21/2021   GFRAA 124 01/17/2020    Speciality Comments: No specialty comments available.  Procedures:  No procedures performed Allergies: Patient has no known allergies.   Assessment / Plan:     Visit Diagnoses: Pain in joint, multiple sites  Joint pain in multiple areas but primarily issue in the feet and ankles. Exam and limited ultrasound inspection today did not indicate a particular inflammatory cause or changes. Combined with previous negative lab testing I do not see evidence for underlying inflammatory process. Unfortunately I do not think I  have much to recommend new in treatment options, did recommend f/u with ortho pr podiatry clinic who had considered additional procedural interventions as options. Generalized body pains also present although less limiting of her function reported. Could be pain sensitization problem but does not describe all associated features for fibromyalgia syndrome but may benefit trial of SNRI or other neuropathic agent if the generalized symptoms are more problematic.   Chronic fatigue disorder  Excessive fatigue but also describes frequent sleep disruption that may be contributory. Skin changes and remainder of exam nonspecific.  Orders: No orders of the defined types were placed in this encounter.  No orders of the defined types were placed in this encounter.    Follow-Up Instructions: No follow-ups on file.   Collier Salina, MD  Note - This record has been created using [employer].  Chart creation errors have been sought, but may not always  have been located. Such creation errors do not reflect on  the standard of medical care.

## 2021-06-02 ENCOUNTER — Other Ambulatory Visit: Payer: Self-pay

## 2021-06-02 ENCOUNTER — Encounter: Payer: Self-pay | Admitting: Internal Medicine

## 2021-06-02 ENCOUNTER — Ambulatory Visit (INDEPENDENT_AMBULATORY_CARE_PROVIDER_SITE_OTHER): Payer: 59 | Admitting: Internal Medicine

## 2021-06-02 VITALS — BP 137/74 | HR 74 | Resp 15 | Ht 65.0 in | Wt 202.0 lb

## 2021-06-02 DIAGNOSIS — M2550 Pain in unspecified joint: Secondary | ICD-10-CM | POA: Diagnosis not present

## 2021-06-02 DIAGNOSIS — G9332 Myalgic encephalomyelitis/chronic fatigue syndrome: Secondary | ICD-10-CM

## 2021-06-15 DIAGNOSIS — Z0289 Encounter for other administrative examinations: Secondary | ICD-10-CM

## 2021-07-01 ENCOUNTER — Other Ambulatory Visit: Payer: Self-pay | Admitting: Family Medicine

## 2021-07-01 DIAGNOSIS — Z1231 Encounter for screening mammogram for malignant neoplasm of breast: Secondary | ICD-10-CM

## 2021-07-02 ENCOUNTER — Other Ambulatory Visit: Payer: Self-pay | Admitting: Family Medicine

## 2021-07-02 DIAGNOSIS — Z1231 Encounter for screening mammogram for malignant neoplasm of breast: Secondary | ICD-10-CM

## 2021-07-07 ENCOUNTER — Other Ambulatory Visit: Payer: Self-pay

## 2021-07-08 ENCOUNTER — Other Ambulatory Visit (HOSPITAL_COMMUNITY): Payer: Self-pay

## 2021-07-09 ENCOUNTER — Other Ambulatory Visit: Payer: Self-pay

## 2021-07-09 ENCOUNTER — Ambulatory Visit (INDEPENDENT_AMBULATORY_CARE_PROVIDER_SITE_OTHER): Payer: 59 | Admitting: Obstetrics and Gynecology

## 2021-07-09 ENCOUNTER — Encounter: Payer: Self-pay | Admitting: Obstetrics and Gynecology

## 2021-07-09 VITALS — BP 131/64 | HR 72 | Resp 16 | Ht 65.0 in | Wt 206.0 lb

## 2021-07-09 DIAGNOSIS — N951 Menopausal and female climacteric states: Secondary | ICD-10-CM | POA: Diagnosis not present

## 2021-07-09 DIAGNOSIS — Z01419 Encounter for gynecological examination (general) (routine) without abnormal findings: Secondary | ICD-10-CM

## 2021-07-09 DIAGNOSIS — R7303 Prediabetes: Secondary | ICD-10-CM | POA: Diagnosis not present

## 2021-07-09 DIAGNOSIS — G479 Sleep disorder, unspecified: Secondary | ICD-10-CM

## 2021-07-09 DIAGNOSIS — R5383 Other fatigue: Secondary | ICD-10-CM

## 2021-07-09 DIAGNOSIS — E785 Hyperlipidemia, unspecified: Secondary | ICD-10-CM

## 2021-07-09 DIAGNOSIS — Z90710 Acquired absence of both cervix and uterus: Secondary | ICD-10-CM

## 2021-07-09 DIAGNOSIS — Z1231 Encounter for screening mammogram for malignant neoplasm of breast: Secondary | ICD-10-CM

## 2021-07-09 DIAGNOSIS — R5381 Other malaise: Secondary | ICD-10-CM

## 2021-07-09 MED ORDER — TRAZODONE HCL 50 MG PO TABS
50.0000 mg | ORAL_TABLET | Freq: Every day | ORAL | 1 refills | Status: DC
  Filled 2021-07-09: qty 30, 30d supply, fill #0

## 2021-07-09 MED ORDER — CYANOCOBALAMIN 1000 MCG/ML IJ SOLN
1000.0000 ug | Freq: Once | INTRAMUSCULAR | Status: AC
  Administered 2021-07-09: 12:00:00 1000 ug via INTRAMUSCULAR

## 2021-07-09 NOTE — Progress Notes (Signed)
ANNUAL PREVENTATIVE CARE GYN  ENCOUNTER NOTE  Subjective:       Kristy Travis is a 55 y.o. [phone] female here for a routine annual gynecologic exam.  The patient wears seatbelts.   The patient does not engage in regular exercise.  She is curently sexually active.    Current complaints: 1.  Vasomotor symptoms (night sweats, hot flushes), mostly at night. Not as bad as it used to be. Also noting difficulty sleeping. Tends to wake up after 2-3 hours of sleep (often not associated with a flash).   2. Reports having joint pain, skin rashes, has been evaluated, even worked up for lupus and other immune disorders. Notes she was told it may be related to menopause and arthritis.  3. Continues to have chronic unexplainable fatigue and low energy.  Has been worked up in the past with negative findings.   Gynecologic History Patient's last menstrual period was 06/24/2014. Contraception: status post hysterectomy Last Pap: 11/2012. Results were: normal.  Denies h/o abnormal pap smears.  Last mammogram: 05/2020. Results were: normal.  Last colonoscopy: 12/24/2017.  Results were: normal.    Obstetric History OB History  Gravida Para Term Preterm AB Living  3 2 2   1 2   SAB IAB Ectopic Multiple Live Births  1            # Outcome Date GA Lbr Len/2nd Weight Sex Delivery Anes PTL Lv  3 SAB           2 Term      Vag-Spont     1 Term      Vag-Spont       Past Medical History:  Diagnosis Date   Arthritis    BACK PAIN, LUMBAR 01/15/2008   Qualifier: Diagnosis of  By: 03/17/2008 MD, Margaret     Carpal tunnel syndrome    Chronic fatigue    GERD (gastroesophageal reflux disease)    GERD (gastroesophageal reflux disease) 04/06/2017   Glucose intolerance (impaired glucose tolerance) 12/12/2017   Hypertension    per patient this was prior to weight loss surgery/cn 12/01/17   Kidney stone on right side 12/21/2016   Metabolic syndrome X 09/02/2010   Qualifier: Diagnosis of  By: 09/04/2010 LPN, Brandi      Need for shingles vaccine 12/11/2018   Obesity    Pancreatitis 07/2014   had  gall bladder removed , spent 1 week in hospital   PONV (postoperative nausea and vomiting)    Seasonal allergies 04/04/2012    Past Surgical History:  Procedure Laterality Date   ABDOMINAL HYSTERECTOMY     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   CHOLECYSTECTOMY  07/2014   COLONOSCOPY WITH PROPOFOL N/A 05/01/2021   Procedure: COLONOSCOPY WITH BIOPSY;  Surgeon: 05/03/2021, MD;  Location[facility];  Service: Endoscopy;  Laterality: N/A;   cyst removed from left foot and bone removed from left foot  April and June 2010   DILATION AND CURETTAGE OF UTERUS     ESOPHAGOGASTRODUODENOSCOPY N/A 03/03/2016   Procedure: ESOPHAGOGASTRODUODENOSCOPY (EGD);  Surgeon: 03/05/2016, MD;  [facility];  Service: Endoscopy;  Laterality: N/A;  3:00 - moved to 2:00 - Ann to notify pt   ESOPHAGOGASTRODUODENOSCOPY (EGD) WITH PROPOFOL N/A 05/01/2021   Procedure: ESOPHAGOGASTRODUODENOSCOPY (EGD) WITH PROPOFOL;  Surgeon: 05/03/2021, MD;  Location: [facility];  Service: Endoscopy;  Laterality: N/A;   KNEE ARTHROSCOPY Right 12/12/2017   Procedure: ARTHROSCOPY KNEE, MEDIAL CHONDROPLASTY, LOOSE BODY  [facility] LATERAL MENISCECTOMY;  Surgeon: Dereck Leep, MD;  Location: [facility];  Service: Orthopedics;  Laterality: Right;   KNEE ARTHROSCOPY Left 06/02/2018   Procedure: ARTHROSCOPY KNEE, MEDIAL CHONDROPLASTY, PARTIAL MEDIAL MENISCECTOMY;  Surgeon: Dereck Leep, MD;  Location: [facility];  Service: Orthopedics;  Laterality: Left;   laparoscopy to eval dyspepsia  2004   POLYPECTOMY N/A 05/01/2021   Procedure: POLYPECTOMY;  Surgeon: Lucilla Lame, MD;  Location: [place];  Service: Endoscopy;  Laterality: N/A;   TOTAL VAGINAL HYSTERECTOMY  06/2014   with BSO     Current Outpatient Medications on File Prior to Visit  Medication Sig Dispense Refill   ergocalciferol (VITAMIN D2) 1.25  MG (50000 UT) capsule Take 1 capsule (50,000 Units total) by mouth once a week. One capsule once weekly 12 capsule 2   Current Facility-Administered Medications on File Prior to Visit  Medication Dose Route Frequency Provider Last Rate Last Admin   betamethasone acetate-betamethasone sodium phosphate (CELESTONE) injection 3 mg  3 mg Intra-articular Once Evans, Dorathy Daft, DPM        No Known Allergies   Social History   Socioeconomic History   Marital status: Married    Spouse name: Not on file   Number of children: Not on file   Years of education: Not on file   Highest education level: Not on file  Occupational History   Not on file  Tobacco Use   Smoking status: Never   Smokeless tobacco: Never  Vaping Use   Vaping Use: Never used  Substance and Sexual Activity   Alcohol use: Not Currently    Comment: occasionally   Drug use: No   Sexual activity: Yes    Birth control/protection: Surgical  Other Topics Concern   Not on file  Social History Narrative   Not on file   Social Determinants of Health   Financial Resource Strain: Not on file  Food Insecurity: Not on file  Transportation Needs: Not on file  Physical Activity: Not on file  Stress: Not on file  Social Connections: Not on file  Intimate Partner Violence: Not on file    Family History  Problem Relation Age of Onset   Thrombosis Mother    Diabetes Father    Pulmonary Hypertension Father    Breast cancer Neg Hx    Bladder Cancer Neg Hx    Kidney cancer Neg Hx       Review of Systems ROS Review of Systems - General ROS: negative for - chills, , fever, weight gain or weight loss.  Positive for hot flashes and night sweats, fatigue, sleep disturbances Psychological ROS: negative for - anxiety, decreased libido, depression, mood swings, physical abuse or sexual abuse.  Ophthalmic ROS: negative for - blurry vision, eye pain or loss of vision ENT ROS: negative for - headaches, hearing change, visual  changes or vocal changes Allergy and Immunology ROS: negative for - hives, itchy/watery eyes or seasonal allergies Hematological and Lymphatic ROS: negative for - bleeding problems, bruising, swollen lymph nodes or weight loss Endocrine ROS: negative for - galactorrhea, hair pattern changes, malaise/lethargy, mood swings, palpitations, polydipsia/polyuria, skin changes, temperature intolerance or unexpected weight changes Breast ROS: negative for - new or changing breast lumps or nipple discharge Respiratory ROS: negative for - cough or shortness of breath Cardiovascular ROS: negative for - chest pain, irregular heartbeat, palpitations or shortness of breath Gastrointestinal ROS: no abdominal pain, change in bowel habits, or black or bloody stools Genito-Urinary ROS: negative for  dysuria, trouble voiding, or hematuria Musculoskeletal ROS: positive for - joint pain or joint stiffness Neurological ROS: negative for - bowel and bladder control changes Dermatological ROS: negative for rash and skin lesion changes   Objective:   BP 131/64    Pulse 72    Resp 16    Ht 5\' 5"  (1.651 m)    Wt 206 lb (93.4 kg)    LMP 06/24/2014    BMI 34.28 kg/m    CONSTITUTIONAL: Well-developed, well-nourished female in no acute distress. Mild obesity PSYCHIATRIC: Normal mood and affect. Normal behavior. Normal judgment and thought content. [place]: Alert and oriented to person, place, and time. Normal muscle tone coordination. No cranial nerve deficit noted. HENT:  Normocephalic, atraumatic, External right and left ear normal. Oropharynx is clear and moist EYES: Conjunctivae and EOM are normal. Pupils are equal, round, and reactive to light. No scleral icterus.  NECK: Normal range of motion, supple, no masses.  Normal thyroid.  SKIN: Skin is warm and dry. No rash noted. Not diaphoretic. No erythema. No pallor. CARDIOVASCULAR: Normal heart rate noted, regular rhythm, no murmur. RESPIRATORY: Clear to auscultation  bilaterally. Effort and breath sounds normal, no problems with respiration noted. BREASTS: Symmetric in size. No masses, skin changes, nipple drainage, or lymphadenopathy. ABDOMEN: Soft, normal bowel sounds, no distention noted.  No tenderness, rebound or guarding.  BLADDER: Normal PELVIC:  Bladder no bladder distension noted  Urethra: normal appearing urethra with no masses, tenderness or lesions  Vulva: normal appearing vulva with no masses, tenderness or lesions  Vagina: normal appearing vagina with normal color, no discharge or lesions.    Cervix: surgically absent  Uterus: not indicated and surgically absent, vaginal cuff well healed  Adnexa: surgically absent bilateral  Rectal: not indicated MUSCULOSKELETAL: Normal range of motion. No tenderness.  No cyanosis, clubbing, or edema.  2+ distal pulses. LYMPHATIC: No Axillary, Supraclavicular, or Inguinal Adenopathy.     Labs:  Lab Results  Component Value Date   WBC 6.1 01/22/2021   HGB 11.8 01/22/2021   HCT 36.9 01/22/2021   MCV 82 01/22/2021   PLT 250 01/22/2021    Lab Results  Component Value Date   CREATININE 0.65 05/21/2021   BUN 12 05/21/2021   NA 146 (H) 05/21/2021   K 4.4 05/21/2021   CL 106 05/21/2021   CO2 26 05/21/2021    Lab Results  Component Value Date   ALT 12 05/21/2021   AST 16 05/21/2021   ALKPHOS 89 05/21/2021   BILITOT 0.3 05/21/2021    Lab Results  Component Value Date   TSH 3.210 01/22/2021    Lab Results  Component Value Date   CHOL 277 (H) 05/21/2021   HDL 84 05/21/2021   LDLCALC 179 (H) 05/21/2021   TRIG 88 05/21/2021   CHOLHDL 3.3 05/21/2021     Lab Results  Component Value Date   HGBA1C 5.6 (A) 01/17/2020     Assessment:   Annual gynecologic examination 55 y.o. Contraception: status post hysterectomy Obesity 1  Vasomotor symptoms Dyslipdemia Prediabetes Sleep disturbances  Plan:   - Pap: Not needed.  Patient s/p hysterectomy with no h/o abnormal pap smears in  the past.  - Mammogram: Ordered - Stool Guaiac Testing:  Not Indicated.   To be scheduled for colonoscopy.  - Labs:  previously performed by PCP.   - Routine preventative health maintenance measures emphasized: Exercise/Diet/Weight control, Alcohol/Substance use risks and Stress Management reassessment of symptoms.   Symptoms may be helped with initiation of  HRT.  -Dyslipidemia, managed by PCP.  - Has completed [place] vaccination series.  - Flu vaccine completed.  - Discussed options for management of sleep disturbances.  After discussion, can try Trazodone.  - Pre-diabetes, discussed dietary modification and exercise.  - Continues to have fatigue and low energy.  Given B12 injection to see if it will help energy levels.  Return to [place] for annual exam.    Rubie Maid, MD [facility]

## 2021-07-21 ENCOUNTER — Ambulatory Visit (INDEPENDENT_AMBULATORY_CARE_PROVIDER_SITE_OTHER): Payer: 59 | Admitting: Bariatrics

## 2021-07-27 ENCOUNTER — Ambulatory Visit: Payer: 59 | Admitting: Family Medicine

## 2021-08-04 ENCOUNTER — Ambulatory Visit (INDEPENDENT_AMBULATORY_CARE_PROVIDER_SITE_OTHER): Payer: 59 | Admitting: Bariatrics

## 2021-08-05 ENCOUNTER — Other Ambulatory Visit: Payer: Self-pay

## 2021-08-05 ENCOUNTER — Encounter (INDEPENDENT_AMBULATORY_CARE_PROVIDER_SITE_OTHER): Payer: Self-pay | Admitting: Bariatrics

## 2021-08-05 ENCOUNTER — Ambulatory Visit (INDEPENDENT_AMBULATORY_CARE_PROVIDER_SITE_OTHER): Payer: 59 | Admitting: Bariatrics

## 2021-08-05 VITALS — BP 121/80 | HR 78 | Temp 98.0°F | Ht 65.0 in | Wt 203.0 lb

## 2021-08-05 DIAGNOSIS — K219 Gastro-esophageal reflux disease without esophagitis: Secondary | ICD-10-CM

## 2021-08-05 DIAGNOSIS — Z6833 Body mass index (BMI) 33.0-33.9, adult: Secondary | ICD-10-CM | POA: Diagnosis not present

## 2021-08-05 DIAGNOSIS — K5900 Constipation, unspecified: Secondary | ICD-10-CM | POA: Diagnosis not present

## 2021-08-05 DIAGNOSIS — E559 Vitamin D deficiency, unspecified: Secondary | ICD-10-CM

## 2021-08-05 DIAGNOSIS — E669 Obesity, unspecified: Secondary | ICD-10-CM

## 2021-08-05 DIAGNOSIS — E7849 Other hyperlipidemia: Secondary | ICD-10-CM | POA: Diagnosis not present

## 2021-08-05 DIAGNOSIS — Z1331 Encounter for screening for depression: Secondary | ICD-10-CM

## 2021-08-05 DIAGNOSIS — R7303 Prediabetes: Secondary | ICD-10-CM

## 2021-08-05 DIAGNOSIS — R5383 Other fatigue: Secondary | ICD-10-CM | POA: Diagnosis not present

## 2021-08-05 DIAGNOSIS — I10 Essential (primary) hypertension: Secondary | ICD-10-CM

## 2021-08-05 DIAGNOSIS — R0602 Shortness of breath: Secondary | ICD-10-CM | POA: Diagnosis not present

## 2021-08-05 DIAGNOSIS — G9332 Myalgic encephalomyelitis/chronic fatigue syndrome: Secondary | ICD-10-CM | POA: Diagnosis not present

## 2021-08-05 DIAGNOSIS — Z9884 Bariatric surgery status: Secondary | ICD-10-CM

## 2021-08-05 NOTE — Progress Notes (Signed)
Chief Complaint:   OBESITY Kristy Travis (MR# [id number]) is a 56 y.o. female who presents for evaluation and treatment of obesity and related comorbidities. Current BMI is Body mass index is 33.78 kg/m. Kristy Travis has been struggling with her weight for many years and has been unsuccessful in either losing weight, maintaining weight loss, or reaching her healthy weight goal.  Kristy Travis does not like to cook. She states that she craves sweets and carbohydrates. She dislike meats and trying to be a vegetarian.   Kristy Travis is currently in the action stage of change and ready to dedicate time achieving and maintaining a healthier weight. Kristy Travis is interested in becoming our patient and working on intensive lifestyle modifications including (but not limited to) diet and exercise for weight loss.  Kristy Travis's habits were reviewed today and are as follows: Her family eats meals together, her desired weight loss is 64 pounds, she has been heavy most of her life, she started gaining weight in middle school, her heaviest weight ever was 250 pounds, she has significant food cravings issues, she snacks frequently in the evenings, she is trying to follow a vegetarian diet, she is trying to follow a vegan diet, she is frequently drinking liquids with calories, she frequently makes poor food choices, she has problems with excessive hunger, she frequently eats larger portions than normal, she has binge eating behaviors, and she struggles with emotional eating.  Depression Screen Kristy Travis's Food and Mood (modified PHQ-9) score was 6.  Depression screen [facility] 2/9 08/05/2021  Decreased Interest 1  Down, Depressed, Hopeless 0  PHQ - 2 Score 1  Altered sleeping 1  Tired, decreased energy 3  Change in appetite 1  Feeling bad or failure about yourself  0  Trouble concentrating 0  Moving slowly or fidgety/restless 0  Suicidal thoughts -  PHQ-9 Score 6  Difficult doing work/chores -  Some recent data might be  hidden   Subjective:   1. Other fatigue [name] admits to daytime somnolence and admits to waking up still tired. Patent has a history of symptoms of daytime fatigue, morning fatigue and morning headache. Kristy Travis generally gets 5 hours of sleep per night, and states that she has nighttime awakenings. Snoring is not present. Apneic episodes are not present. Epworth Sleepiness Score is 9.   2. SOB (shortness of breath) on exertion Kristy Travis notes increasing shortness of breath with exercising and seems to be worsening over time with weight gain. She notes getting out of breath sooner with activity than she used to. This has not gotten worse recently. [name] denies shortness of breath at rest or orthopnea.   3. Pre-diabetes [name] has a family history of pre-diabetes. She is currently not on medications. Her last A1C was 6.1.  4. Essential hypertension Kristy Travis is not on medications currently. Her blood pressure is controlled after weight loss.   5. Other hyperlipidemia Kristy Travis is currently not on medications. She does not eat meat.   6. Chronic fatigue syndrome Kristy Travis's fatigue is chronic.  7. Gastroesophageal reflux disease, unspecified whether esophagitis present Kristy Travis is currently taking Dexilant.   8. Vitamin D deficiency Kristy Travis is taking Vitamin D high dose. Her last Vitamin D was 38.2 on 05/21/2021.  9. Constipation, unspecified constipation type Kristy Travis is taking Miralax and stool softener.   10. History of bariatric surgery [name] has a history of bariatric surgery. Her lowest weight was 170 lbs. Her highest was 250 lbs.   Assessment/Plan:   1. Other fatigue [name] does feel  that her weight is causing her energy to be lower than it should be. Fatigue may be related to obesity, depression or many other causes. Labs will be ordered, and in the meanwhile, [name] will focus on self care including making healthy food choices, increasing physical activity and focusing on stress  reduction.  - EKG 12-Lead  2. SOB (shortness of breath) on exertion [name] does feel that she gets out of breath more easily that she used to when she exercises. [name]'s shortness of breath appears to be obesity related and exercise induced. She has agreed to work on weight loss and gradually increase exercise to treat her exercise induced shortness of breath. Will continue to monitor closely.   3. Pre-diabetes [name] will continue to work on weight loss, exercise, and decreasing simple carbohydrates to help decrease the risk of diabetes. She will increase healthy fats and protein. We will check labs today.  - Insulin, random  4. Essential hypertension [name] will continue to work on diet and exercise to improve blood pressure control. We will watch for signs of hypotension as she continues her lifestyle modifications.  5. Other hyperlipidemia Cardiovascular risk and specific lipid/LDL goals reviewed. We discussed several lifestyle modifications today. [name] is to consider having CT/coronary vessels and coronary calcium study. She will continue to work on diet, exercise and weight loss efforts. Orders and follow up as documented in patient record.   Counseling Intensive lifestyle modifications are the first line treatment for this issue. Dietary changes: Increase soluble fiber. Decrease simple carbohydrates. Exercise changes: Moderate to vigorous-intensity aerobic activity 150 minutes per week if tolerated. Lipid-lowering medications: see documented in medical record.  6. Chronic fatigue syndrome [name] will gradually increase activities.   7. Gastroesophageal reflux disease, unspecified whether esophagitis present Intensive lifestyle modifications are the first line treatment for this issue. [name] will continue taking her medications. We discussed several lifestyle modifications today and she will continue to work on diet, exercise and weight loss efforts. Orders and follow up as  documented in patient record.   Counseling If a person has gastroesophageal reflux disease (GERD), food and stomach acid move back up into the esophagus and cause symptoms or problems such as damage to the esophagus. Anti-reflux measures include: raising the head of the bed, avoiding tight clothing or belts, avoiding eating late at night, not lying down shortly after mealtime, and achieving weight loss. Avoid ASA, NSAID's, caffeine, alcohol, and tobacco.  OTC Pepcid and/or Tums are often very helpful for as needed use.  However, for persisting chronic or daily symptoms, stronger medications like Omeprazole may be needed. You may need to avoid foods and drinks such as: Coffee and tea (with or without caffeine). Drinks that contain alcohol. Energy drinks and sports drinks. Bubbly (carbonated) drinks or sodas. Chocolate and cocoa. Peppermint and mint flavorings. Garlic and onions. Horseradish. Spicy and acidic foods. These include peppers, chili powder, curry powder, vinegar, hot sauces, and BBQ sauce. Citrus fruit juices and citrus fruits, such as oranges, lemons, and limes. Tomato-based foods. These include red sauce, chili, salsa, and pizza with red sauce. Fried and fatty foods. These include donuts, french fries, potato chips, and high-fat dressings. High-fat meats. These include hot dogs, rib eye steak, sausage, ham, and bacon.   8. Vitamin D deficiency Low Vitamin D level contributes to fatigue and are associated with obesity, breast, and colon cancer. Kristy Travis will continue high dose Vitamin D and she will follow-up for routine testing of Vitamin D, at least 2-3 times per year  to avoid over-replacement.  9. Constipation, unspecified constipation type Kristy Travis will continue taking Miralax and stool softeners. She was informed that a decrease in bowel movement frequency is normal while losing weight, but stools should not be hard or painful. Orders and follow up as documented in patient  record.   Counseling Getting to Good Bowel Health: Your goal is to have one soft bowel movement each day. Drink at least 8 glasses of water each day. Eat plenty of fiber (goal is over 25 grams each day). It is best to get most of your fiber from dietary sources which includes leafy green vegetables, fresh fruit, and whole grains. You may need to add fiber with the help of OTC fiber supplements. These include Metamucil, Citrucel, and Flaxseed. If you are still having trouble, try adding Miralax or Magnesium Citrate. If all of these changes do not work, Cabin crew.   10. History of bariatric surgery Kristy Travis will have small fruit portions.   11. Depression screen Kristy Travis had a positive depression screening. Depression is commonly associated with obesity and often results in emotional eating behaviors. We will monitor this closely and work on CBT to help improve the non-hunger eating patterns. Referral to Psychology may be required if no improvement is seen as she continues in our clinic.   12. Class 1 obesity with serious comorbidity and body mass index (BMI) of 33.0 to 33.9 in adult, unspecified obesity type Kristy Travis is currently in the action stage of change and her goal is to continue with weight loss efforts. I recommend Kristy Travis begin the structured treatment plan as follows:  She has agreed to the Category 2 Plan and the [place].  Kristy Travis will have no sugary drinks. She will continue meal planning. We reviewed labs from 05/21/2021 CMP, Lipid, Vitamin D and  A1C. She will have a shake in the morning.  Exercise goals: No exercise has been prescribed at this time.   Behavioral modification strategies: increasing lean protein intake, decreasing simple carbohydrates, increasing vegetables, increasing water intake, decreasing eating out, no skipping meals, meal planning and cooking strategies, keeping healthy foods in the home, and planning for success.  She was informed of the  importance of frequent follow-up visits to maximize her success with intensive lifestyle modifications for her multiple health conditions. She was informed we would discuss her lab results at her next visit unless there is a critical issue that needs to be addressed sooner. Kristy Travis agreed to keep her next visit at the agreed upon time to discuss these results.  Objective:   Blood pressure 121/80, pulse 78, temperature 98 F (36.7 C), height 5\' 5"  (1.651 m), weight 203 lb (92.1 kg), last menstrual period 06/24/2014, SpO2 99 %. Body mass index is 33.78 kg/m.  EKG: Normal sinus rhythm, rate 70 bpm.  Indirect Calorimeter completed today shows a VO2 of 217 and a REE of 1498.  Her calculated basal metabolic rate is [id number] thus her basal metabolic rate is worse than expected.  General: Cooperative, alert, well developed, in no acute distress. HEENT: Conjunctivae and lids unremarkable. Cardiovascular: Regular rhythm.  Lungs: Normal work of breathing. Neurologic: No focal deficits.   Lab Results  Component Value Date   CREATININE 0.65 05/21/2021   BUN 12 05/21/2021   NA 146 (H) 05/21/2021   K 4.4 05/21/2021   CL 106 05/21/2021   CO2 26 05/21/2021   Lab Results  Component Value Date   ALT 12 05/21/2021   AST 16 05/21/2021   ALKPHOS  89 05/21/2021   BILITOT 0.3 05/21/2021   Lab Results  Component Value Date   HGBA1C 6.1 (H) 05/21/2021   HGBA1C 5.8 (H) 01/22/2021   HGBA1C 5.6 01/17/2020   HGBA1C 5.6 01/17/2020   HGBA1C 5.6 (A) 01/17/2020   HGBA1C 5.6 01/17/2020   No results found for: INSULIN Lab Results  Component Value Date   TSH 3.210 01/22/2021   Lab Results  Component Value Date   CHOL 277 (H) 05/21/2021   HDL 84 05/21/2021   LDLCALC 179 (H) 05/21/2021   TRIG 88 05/21/2021   CHOLHDL 3.3 05/21/2021   Lab Results  Component Value Date   WBC 6.1 01/22/2021   HGB 11.8 01/22/2021   HCT 36.9 01/22/2021   MCV 82 01/22/2021   PLT 250 01/22/2021   Lab Results  Component  Value Date   IRON 129 02/18/2015   TIBC 390 08/16/2013   FERRITIN 16 02/18/2015   Attestation Statements:   Reviewed by clinician on day of visit: allergies, medications, problem list, medical history, surgical history, family history, social history, and previous encounter notes.  I, Lizbeth Bark, RMA, am acting as Location manager for [employer], DO.  I have reviewed the above documentation for accuracy and completeness, and I agree with the above. Jearld Lesch, DO

## 2021-08-06 ENCOUNTER — Encounter (INDEPENDENT_AMBULATORY_CARE_PROVIDER_SITE_OTHER): Payer: Self-pay | Admitting: Bariatrics

## 2021-08-06 LAB — INSULIN, RANDOM: INSULIN: 12.3 u[IU]/mL (ref 2.6–24.9)

## 2021-08-19 ENCOUNTER — Encounter (INDEPENDENT_AMBULATORY_CARE_PROVIDER_SITE_OTHER): Payer: Self-pay | Admitting: Bariatrics

## 2021-08-19 ENCOUNTER — Ambulatory Visit (INDEPENDENT_AMBULATORY_CARE_PROVIDER_SITE_OTHER): Payer: 59 | Admitting: Bariatrics

## 2021-08-19 ENCOUNTER — Other Ambulatory Visit: Payer: Self-pay

## 2021-08-19 VITALS — BP 129/84 | HR 82 | Temp 98.1°F | Ht 65.0 in | Wt 201.0 lb

## 2021-08-19 DIAGNOSIS — E669 Obesity, unspecified: Secondary | ICD-10-CM | POA: Diagnosis not present

## 2021-08-19 DIAGNOSIS — R632 Polyphagia: Secondary | ICD-10-CM | POA: Diagnosis not present

## 2021-08-19 DIAGNOSIS — Z6833 Body mass index (BMI) 33.0-33.9, adult: Secondary | ICD-10-CM

## 2021-08-19 DIAGNOSIS — I10 Essential (primary) hypertension: Secondary | ICD-10-CM | POA: Diagnosis not present

## 2021-08-19 DIAGNOSIS — R7303 Prediabetes: Secondary | ICD-10-CM

## 2021-08-19 MED ORDER — WEGOVY 0.25 MG/0.5ML ~~LOC~~ SOAJ
0.2500 mg | SUBCUTANEOUS | 0 refills | Status: DC
  Filled 2021-08-19 – 2021-09-01 (×5): qty 2, 28d supply, fill #0

## 2021-08-19 NOTE — Progress Notes (Signed)
Chief Complaint:   OBESITY Renetha is here to discuss her progress with her obesity treatment plan along with follow-up of her obesity related diagnoses. Eirini is on the [employer] and states she is following her eating plan approximately 50% of the time. Sisi states she is walking for 5 minutes 5 times per week.  Today's visit was #: 2 Starting weight: 203 lbs Starting date: 08/05/2021 Today's weight: 201 lbs Today's date: 08/19/2021 Total lbs lost to date: 2 lbs Total lbs lost since last in-office visit: 2 lbs  Interim History: Bretney is down 2 lbs since her first visit. She was hungry in the morning.   Subjective:   1. Pre-diabetes Barri's last A1C was 6.1. Her insulin was 12.3.  2. Essential hypertension Makeba is currently not on medications. Her blood pressure is controlled. Her blood pressure was 129/84 today.  3. Polyphagia Svara is not on contraindications currently.  Assessment/Plan:   1. Pre-diabetes Handout on insulin resistance and pre-diabetes was provided today. Ladell will continue to work on weight loss, exercise, and decreasing simple carbohydrates to help decrease the risk of diabetes.   2. Essential hypertension We will follow over time. Shanitta is working on healthy weight loss and exercise to improve blood pressure control. We will watch for signs of hypotension as she continues her lifestyle modifications.  3. Polyphagia Intensive lifestyle modifications are the first line treatment for this issue. We will refill Wegovy 0.25 mg for 1 month with no refills We discussed several lifestyle modifications today and she will continue to work on diet, exercise and weight loss efforts. Orders and follow up as documented in patient record. Needs pre-authorization.   Counseling Polyphagia is excessive hunger. Causes can include: low blood sugars, hypERthyroidism, PMS, lack of sleep, stress, insulin resistance, diabetes, certain medications, and diets  that are deficient in protein and fiber.    - Semaglutide-Weight Management (WEGOVY) 0.25 MG/0.5ML SOAJ; Inject 0.25 mg into the skin once a week.  Dispense: 2 mL; Refill: 0  4. Obesity, current BMI 33.5 Loring is currently in the action stage of change. As such, her goal is to continue with weight loss efforts. She has agreed to keeping a food journal and adhering to recommended goals of 1200 calories and 80 grams of protein and the [place].   Pamale will continue meal planning and she will continue intentional eating. We reviewed her labs from 08/05/2021  ( insulin ). She will pack her lunch.  Exercise goals:  As is.  Behavioral modification strategies: increasing lean protein intake, decreasing simple carbohydrates, increasing vegetables, increasing water intake, decreasing eating out, no skipping meals, meal planning and cooking strategies, keeping healthy foods in the home, and planning for success.  Yatziry has agreed to follow-up with our clinic in 2 weeks. She was informed of the importance of frequent follow-up visits to maximize her success with intensive lifestyle modifications for her multiple health conditions.   Objective:   Blood pressure 129/84, pulse 82, temperature 98.1 F (36.7 C), height 5\' 5"  (1.651 m), weight 201 lb (91.2 kg), last menstrual period 06/24/2014, SpO2 98 %. Body mass index is 33.45 kg/m.  General: Cooperative, alert, well developed, in no acute distress. HEENT: Conjunctivae and lids unremarkable. Cardiovascular: Regular rhythm.  Lungs: Normal work of breathing. Neurologic: No focal deficits.   Lab Results  Component Value Date   CREATININE 0.65 05/21/2021   BUN 12 05/21/2021   NA 146 (H) 05/21/2021   K 4.4 05/21/2021  CL 106 05/21/2021   CO2 26 05/21/2021   Lab Results  Component Value Date   ALT 12 05/21/2021   AST 16 05/21/2021   ALKPHOS 89 05/21/2021   BILITOT 0.3 05/21/2021   Lab Results  Component Value Date   HGBA1C 6.1  (H) 05/21/2021   HGBA1C 5.8 (H) 01/22/2021   HGBA1C 5.6 01/17/2020   HGBA1C 5.6 01/17/2020   HGBA1C 5.6 (A) 01/17/2020   HGBA1C 5.6 01/17/2020   Lab Results  Component Value Date   INSULIN 12.3 08/05/2021   Lab Results  Component Value Date   TSH 3.210 01/22/2021   Lab Results  Component Value Date   CHOL 277 (H) 05/21/2021   HDL 84 05/21/2021   LDLCALC 179 (H) 05/21/2021   TRIG 88 05/21/2021   CHOLHDL 3.3 05/21/2021   Lab Results  Component Value Date   VD25OH 38.2 05/21/2021   VD25OH 20.6 (L) 01/22/2021   VD25OH 22 (L) 01/17/2020   Lab Results  Component Value Date   WBC 6.1 01/22/2021   HGB 11.8 01/22/2021   HCT 36.9 01/22/2021   MCV 82 01/22/2021   PLT 250 01/22/2021   Lab Results  Component Value Date   IRON 129 02/18/2015   TIBC 390 08/16/2013   FERRITIN 16 02/18/2015   Attestation Statements:   Reviewed by clinician on day of visit: allergies, medications, problem list, medical history, surgical history, family history, social history, and previous encounter notes.  Time spent on visit including pre-visit chart review and post-visit care and charting was 30 minutes.   I, Lizbeth Bark, RMA, am acting as Location manager for [employer], DO.  I have reviewed the above documentation for accuracy and completeness, and I agree with the above. Jearld Lesch, DO

## 2021-08-20 ENCOUNTER — Encounter (INDEPENDENT_AMBULATORY_CARE_PROVIDER_SITE_OTHER): Payer: Self-pay | Admitting: Bariatrics

## 2021-08-25 ENCOUNTER — Other Ambulatory Visit: Payer: Self-pay

## 2021-08-28 ENCOUNTER — Other Ambulatory Visit: Payer: Self-pay

## 2021-08-31 ENCOUNTER — Other Ambulatory Visit: Payer: Self-pay

## 2021-08-31 ENCOUNTER — Telehealth (INDEPENDENT_AMBULATORY_CARE_PROVIDER_SITE_OTHER): Payer: Self-pay | Admitting: Bariatrics

## 2021-08-31 NOTE — Telephone Encounter (Signed)
Patient called stating that she has no Wegovy. Pt states that the pharmacy needs a PA before she can receive the medication. Pt would like a call back to [phone]. Thank You!

## 2021-09-01 ENCOUNTER — Other Ambulatory Visit: Payer: Self-pay

## 2021-09-01 ENCOUNTER — Encounter (INDEPENDENT_AMBULATORY_CARE_PROVIDER_SITE_OTHER): Payer: Self-pay

## 2021-09-01 NOTE — Telephone Encounter (Signed)
Last OV with Dr Brown 

## 2021-09-09 ENCOUNTER — Ambulatory Visit: Payer: 59 | Admitting: Family Medicine

## 2021-09-10 ENCOUNTER — Other Ambulatory Visit: Payer: Self-pay

## 2021-09-10 ENCOUNTER — Ambulatory Visit (INDEPENDENT_AMBULATORY_CARE_PROVIDER_SITE_OTHER): Payer: 59 | Admitting: Bariatrics

## 2021-09-10 ENCOUNTER — Encounter (INDEPENDENT_AMBULATORY_CARE_PROVIDER_SITE_OTHER): Payer: Self-pay | Admitting: Bariatrics

## 2021-09-10 VITALS — BP 126/83 | HR 80 | Temp 97.8°F | Ht 65.0 in | Wt 200.0 lb

## 2021-09-10 DIAGNOSIS — Z6833 Body mass index (BMI) 33.0-33.9, adult: Secondary | ICD-10-CM

## 2021-09-10 DIAGNOSIS — I10 Essential (primary) hypertension: Secondary | ICD-10-CM | POA: Diagnosis not present

## 2021-09-10 DIAGNOSIS — R632 Polyphagia: Secondary | ICD-10-CM | POA: Diagnosis not present

## 2021-09-10 DIAGNOSIS — E669 Obesity, unspecified: Secondary | ICD-10-CM | POA: Diagnosis not present

## 2021-09-10 MED ORDER — WEGOVY 0.5 MG/0.5ML ~~LOC~~ SOAJ
0.5000 mg | SUBCUTANEOUS | 0 refills | Status: DC
Start: 2021-09-10 — End: 2021-10-05
  Filled 2021-09-10: qty 2, 28d supply, fill #0

## 2021-09-10 NOTE — Progress Notes (Signed)
? ? ? ?Chief Complaint:  ? ?OBESITY ?Kristy Travis is here to discuss her progress with her obesity treatment plan along with follow-up of her obesity related diagnoses. Kristy Travis is on the [employer] and states she is following her eating plan approximately 100% of the time. Kristy Travis states she is walking for 15-20 minutes 4 times per week. ? ?Today's visit was #: 3 ?Starting weight: 203 lbs ?Starting date: 08/05/2021 ?Today's weight: 200 lbs ?Today's date: 09/10/2021 ?Total lbs lost to date: 3 lbs ?Total lbs lost since last in-office visit: 1 lb ? ?Interim History: Kristy Travis is down 1 additional pound.  ? ?Subjective:  ? ?1. Polyphagia ?Kristy Travis is currently taking Wegovy.  ? ?2. Essential hypertension ?Kristy Travis is not on medications currently.  ? ?Assessment/Plan:  ? ?1. Polyphagia ?We will refill Wegovy 0.5 mg for 1 month with no refills. Intensive lifestyle modifications are the first line treatment for this issue. We discussed several lifestyle modifications today and she will continue to work on diet, exercise and weight loss efforts. Orders and follow up as documented in patient record. ? ?Counseling ?Polyphagia is excessive hunger. ?Causes can include: low blood sugars, hypERthyroidism, PMS, lack of sleep, stress, insulin resistance, diabetes, certain medications, and diets that are deficient in protein and fiber.   ? ?- Semaglutide-Weight Management (WEGOVY) 0.5 MG/0.5ML SOAJ; Inject 0.5 mg into the skin once a week.  Dispense: 2 mL; Refill: 0 ? ?2. Essential hypertension ?Kristy Travis will continue with the plan and exercise. She is working on healthy weight loss and exercise to improve blood pressure control. We will watch for signs of hypotension as she continues her lifestyle modifications. ? ?3. Obesity, current BMI 33.4 ?Kristy Travis is currently in the action stage of change. As such, her goal is to continue with weight loss efforts. She has agreed to the [employer].  ? ?Kristy Travis will continue meal planning and she will  continue intentional eating. She will increase her water intake. She will have high protein food choices.  ? ?Exercise goals:  As is. ? ?Behavioral modification strategies: increasing lean protein intake, decreasing simple carbohydrates, increasing vegetables, increasing water intake, decreasing eating out, no skipping meals, meal planning and cooking strategies, keeping healthy foods in the home, and planning for success. ? ?Kristy Travis has agreed to follow-up with our clinic in 2 weeks. She was informed of the importance of frequent follow-up visits to maximize her success with intensive lifestyle modifications for her multiple health conditions.  ? ?Objective:  ? ?Blood pressure 126/83, pulse 80, temperature 97.8 ?F (36.6 ?C), height 5\' 5"  (1.651 m), weight 200 lb (90.7 kg), last menstrual period 06/24/2014, SpO2 99 %. ?Body mass index is 33.28 kg/m?. ? ?General: Cooperative, alert, well developed, in no acute distress. ?HEENT: Conjunctivae and lids unremarkable. ?Cardiovascular: Regular rhythm.  ?Lungs: Normal work of breathing. ?Neurologic: No focal deficits.  ? ?Lab Results  ?Component Value Date  ? CREATININE 0.65 05/21/2021  ? BUN 12 05/21/2021  ? NA 146 (H) 05/21/2021  ? K 4.4 05/21/2021  ? CL 106 05/21/2021  ? CO2 26 05/21/2021  ? ?Lab Results  ?Component Value Date  ? ALT 12 05/21/2021  ? AST 16 05/21/2021  ? ALKPHOS 89 05/21/2021  ? BILITOT 0.3 05/21/2021  ? ?Lab Results  ?Component Value Date  ? HGBA1C 6.1 (H) 05/21/2021  ? HGBA1C 5.8 (H) 01/22/2021  ? HGBA1C 5.6 01/17/2020  ? HGBA1C 5.6 01/17/2020  ? HGBA1C 5.6 (A) 01/17/2020  ? HGBA1C 5.6 01/17/2020  ? ?Lab Results  ?Component  Value Date  ? INSULIN 12.3 08/05/2021  ? ?Lab Results  ?Component Value Date  ? TSH 3.210 01/22/2021  ? ?Lab Results  ?Component Value Date  ? CHOL 277 (H) 05/21/2021  ? HDL 84 05/21/2021  ? LDLCALC 179 (H) 05/21/2021  ? TRIG 88 05/21/2021  ? CHOLHDL 3.3 05/21/2021  ? ?Lab Results  ?Component Value Date  ? VD25OH 38.2 05/21/2021  ?  VD25OH 20.6 (L) 01/22/2021  ? VD25OH 22 (L) 01/17/2020  ? ?Lab Results  ?Component Value Date  ? WBC 6.1 01/22/2021  ? HGB 11.8 01/22/2021  ? HCT 36.9 01/22/2021  ? MCV 82 01/22/2021  ? PLT 250 01/22/2021  ? ?Lab Results  ?Component Value Date  ? IRON 129 02/18/2015  ? TIBC 390 08/16/2013  ? FERRITIN 16 02/18/2015  ? ?Attestation Statements:  ? ?Reviewed by clinician on day of visit: allergies, medications, problem list, medical history, surgical history, family history, social history, and previous encounter notes. ? ?I, Lizbeth Bark, RMA, am acting as transcriptionist for [employer], DO. ? ?I have reviewed the above documentation for accuracy and completeness, and I agree with the above. Jearld Lesch, DO ? ?

## 2021-09-12 ENCOUNTER — Emergency Department
Admission: EM | Admit: 2021-09-12 | Discharge: 2021-09-12 | Discharge status: Against Medical Advice | Payer: 59 | Attending: Emergency Medicine | Admitting: Emergency Medicine

## 2021-09-12 ENCOUNTER — Other Ambulatory Visit: Payer: Self-pay

## 2021-09-12 ENCOUNTER — Emergency Department: Payer: 59

## 2021-09-12 DIAGNOSIS — M7989 Other specified soft tissue disorders: Secondary | ICD-10-CM | POA: Insufficient documentation

## 2021-09-12 DIAGNOSIS — Z5321 Procedure and treatment not carried out due to patient leaving prior to being seen by health care provider: Secondary | ICD-10-CM | POA: Insufficient documentation

## 2021-09-12 DIAGNOSIS — M79662 Pain in left lower leg: Secondary | ICD-10-CM | POA: Diagnosis not present

## 2021-09-12 DIAGNOSIS — M7122 Synovial cyst of popliteal space [Baker], left knee: Secondary | ICD-10-CM | POA: Diagnosis not present

## 2021-09-12 LAB — CBC WITH DIFFERENTIAL/PLATELET
Abs Immature Granulocytes: 0.01 10*3/uL (ref 0.00–0.07)
Basophils Absolute: 0 10*3/uL (ref 0.0–0.1)
Basophils Relative: 0 %
Eosinophils Absolute: 0.1 10*3/uL (ref 0.0–0.5)
Eosinophils Relative: 1 %
HCT: 38.8 % (ref 36.0–46.0)
Hemoglobin: 11.9 g/dL — ABNORMAL LOW (ref 12.0–15.0)
Immature Granulocytes: 0 %
Lymphocytes Relative: 25 %
Lymphs Abs: 1.9 10*3/uL (ref 0.7–4.0)
MCH: 26 pg (ref 26.0–34.0)
MCHC: 30.7 g/dL (ref 30.0–36.0)
MCV: 84.7 fL (ref 80.0–100.0)
Monocytes Absolute: 0.5 10*3/uL (ref 0.1–1.0)
Monocytes Relative: 7 %
Neutro Abs: 5 10*3/uL (ref 1.7–7.7)
Neutrophils Relative %: 67 %
Platelets: 264 10*3/uL (ref 150–400)
RBC: 4.58 MIL/uL (ref 3.87–5.11)
RDW: 14 % (ref 11.5–15.5)
WBC: 7.5 10*3/uL (ref 4.0–10.5)
nRBC: 0 % (ref 0.0–0.2)

## 2021-09-12 LAB — BASIC METABOLIC PANEL
Anion gap: 6 (ref 5–15)
BUN: 14 mg/dL (ref 6–20)
CO2: 29 mmol/L (ref 22–32)
Calcium: 9.4 mg/dL (ref 8.9–10.3)
Chloride: 104 mmol/L (ref 98–111)
Creatinine, Ser: 0.62 mg/dL (ref 0.44–1.00)
GFR, Estimated: >60 mL/min (ref >60)
Glucose, Bld: 95 mg/dL (ref 70–99)
Potassium: 3.6 mmol/L (ref 3.5–5.1)
Sodium: 139 mmol/L (ref 135–145)

## 2021-09-12 NOTE — ED Triage Notes (Signed)
Pt arrived via POV ambulatory with c/o L lower leg pain that started today along with swelling and warm to the touch. No personal hx of blood clots, but mother has hx. No recent car travel. ? ? ?

## 2021-09-12 NOTE — ED Notes (Signed)
Pt states she is leaving, pt encouraged to stay and be seen, but declines at this time.  ?

## 2021-09-13 ENCOUNTER — Encounter (INDEPENDENT_AMBULATORY_CARE_PROVIDER_SITE_OTHER): Payer: Self-pay | Admitting: Bariatrics

## 2021-09-29 ENCOUNTER — Other Ambulatory Visit: Payer: Self-pay

## 2021-10-05 ENCOUNTER — Other Ambulatory Visit: Payer: Self-pay

## 2021-10-05 ENCOUNTER — Ambulatory Visit (INDEPENDENT_AMBULATORY_CARE_PROVIDER_SITE_OTHER): Payer: 59 | Admitting: Physician Assistant

## 2021-10-05 ENCOUNTER — Encounter (INDEPENDENT_AMBULATORY_CARE_PROVIDER_SITE_OTHER): Payer: Self-pay | Admitting: Physician Assistant

## 2021-10-05 VITALS — BP 111/73 | HR 74 | Temp 98.1°F | Ht 65.0 in | Wt 192.0 lb

## 2021-10-05 DIAGNOSIS — R7303 Prediabetes: Secondary | ICD-10-CM | POA: Diagnosis not present

## 2021-10-05 DIAGNOSIS — Z6831 Body mass index (BMI) 31.0-31.9, adult: Secondary | ICD-10-CM

## 2021-10-05 DIAGNOSIS — E669 Obesity, unspecified: Secondary | ICD-10-CM | POA: Diagnosis not present

## 2021-10-05 DIAGNOSIS — Z9189 Other specified personal risk factors, not elsewhere classified: Secondary | ICD-10-CM

## 2021-10-05 DIAGNOSIS — R632 Polyphagia: Secondary | ICD-10-CM

## 2021-10-05 MED ORDER — WEGOVY 0.5 MG/0.5ML ~~LOC~~ SOAJ
0.5000 mg | SUBCUTANEOUS | 0 refills | Status: DC
  Filled 2021-10-05 – 2021-10-06 (×2): qty 2, 28d supply, fill #0

## 2021-10-05 NOTE — Progress Notes (Signed)
? ? ? ?Chief Complaint:  ? ?OBESITY ?Kristy Travis is here to discuss her progress with her obesity treatment plan along with follow-up of her obesity related diagnoses. Kristy Travis is on the [employer] and states she is following her eating plan approximately 100% of the time. Kristy Travis states she is walking for 30 minutes 2 times per week. ? ?Today's visit was #: 4 ?Starting weight: 203 lbs ?Starting date: 08/05/2021 ?Today's weight: 192 lbs ?Today's date: 10/05/2021 ?Total lbs lost to date: 11 lbs ?Total lbs lost since last in-office visit: 8 lbs ? ?Interim History: Kristy Travis is getting all of her food in. She has no issues with the plan. She is not drinking enough water.  ? ?Subjective:  ? ?1. Pre-diabetes ?Kristy Travis's last A1C was 6.1. ? ?2. Polyphagia ?Intensive lifestyle modifications are the first line treatment for this issue. Kristy Travis is on [facility]. Her appetite is controlled. We discussed several lifestyle modifications today and she will continue to work on diet, exercise and weight loss efforts. Orders and follow up as documented in patient record. ? ?Counseling ?Polyphagia is excessive hunger. ?Causes can include: low blood sugars, hypERthyroidism, PMS, lack of sleep, stress, insulin resistance, diabetes, certain medications, and diets that are deficient in protein and fiber.   ? ?3. At risk for diabetes mellitus ?Kristy Travis is at higher than average risk for developing diabetes due to her obesity.  ? ?Assessment/Plan:  ? ?1. Pre-diabetes ?We will check labs next 1-2 months and Kristy Travis will continue the plan. Kristy Travis will continue to work on weight loss, exercise, and decreasing simple carbohydrates to help decrease the risk of diabetes.  ? ?2. Polyphagia ?We will refill Wegovy 0.5 mg for 1 month with no refills Intensive lifestyle modifications are the first line treatment for this issue. We discussed several lifestyle modifications today and she will continue to work on diet, exercise and weight loss efforts. Orders and  follow up as documented in patient record. ? ?Counseling ?Polyphagia is excessive hunger. ?Causes can include: low blood sugars, hypERthyroidism, PMS, lack of sleep, stress, insulin resistance, diabetes, certain medications, and diets that are deficient in protein and fiber.   ? ?- Semaglutide-Weight Management (WEGOVY) 0.5 MG/0.5ML SOAJ; Inject 0.5 mg into the skin once a week.  Dispense: 2 mL; Refill: 0 ? ?3. At risk for diabetes mellitus ?Kristy Travis was given approximately 15 minutes of diabetic education and counseling today. We discussed intensive lifestyle modifications today with an emphasis on weight loss as well as increasing exercise and decreasing simple carbohydrates in her diet. We also reviewed medication options with an emphasis on risk versus benefits of those discussed. ? ?Repetitive spaced learning was employed today to elicit superior memory formation and behavioral change.  ? ?4. Obesity, current BMI 31.95 ?Kristy Travis is currently in the action stage of change. As such, her goal is to continue with weight loss efforts. She has agreed to the [employer].  ? ?Exercise goals:  As is. ? ?Behavioral modification strategies: increasing water intake and meal planning and cooking strategies. ? ?Kristy Travis has agreed to follow-up with our clinic in 4 weeks. She was informed of the importance of frequent follow-up visits to maximize her success with intensive lifestyle modifications for her multiple health conditions.  ? ?Objective:  ? ?Blood pressure 111/73, pulse 74, temperature 98.1 ?F (36.7 ?C), height 5\' 5"  (1.651 m), weight 192 lb (87.1 kg), last menstrual period 06/24/2014, SpO2 99 %. ?Body mass index is 31.95 kg/m?. ? ?General: Cooperative, alert, well developed, in no acute distress. ?HEENT:  Conjunctivae and lids unremarkable. ?Cardiovascular: Regular rhythm.  ?Lungs: Normal work of breathing. ?Neurologic: No focal deficits.  ? ?Lab Results  ?Component Value Date  ? CREATININE 0.62 09/12/2021  ? BUN 14  09/12/2021  ? NA 139 09/12/2021  ? K 3.6 09/12/2021  ? CL 104 09/12/2021  ? CO2 29 09/12/2021  ? ?Lab Results  ?Component Value Date  ? ALT 12 05/21/2021  ? AST 16 05/21/2021  ? ALKPHOS 89 05/21/2021  ? BILITOT 0.3 05/21/2021  ? ?Lab Results  ?Component Value Date  ? HGBA1C 6.1 (H) 05/21/2021  ? HGBA1C 5.8 (H) 01/22/2021  ? HGBA1C 5.6 01/17/2020  ? HGBA1C 5.6 01/17/2020  ? HGBA1C 5.6 (A) 01/17/2020  ? HGBA1C 5.6 01/17/2020  ? ?Lab Results  ?Component Value Date  ? INSULIN 12.3 08/05/2021  ? ?Lab Results  ?Component Value Date  ? TSH 3.210 01/22/2021  ? ?Lab Results  ?Component Value Date  ? CHOL 277 (H) 05/21/2021  ? HDL 84 05/21/2021  ? LDLCALC 179 (H) 05/21/2021  ? TRIG 88 05/21/2021  ? CHOLHDL 3.3 05/21/2021  ? ?Lab Results  ?Component Value Date  ? VD25OH 38.2 05/21/2021  ? VD25OH 20.6 (L) 01/22/2021  ? VD25OH 22 (L) 01/17/2020  ? ?Lab Results  ?Component Value Date  ? WBC 7.5 09/12/2021  ? HGB 11.9 (L) 09/12/2021  ? HCT 38.8 09/12/2021  ? MCV 84.7 09/12/2021  ? PLT 264 09/12/2021  ? ?Lab Results  ?Component Value Date  ? IRON 129 02/18/2015  ? TIBC 390 08/16/2013  ? FERRITIN 16 02/18/2015  ? ?Attestation Statements:  ? ?Reviewed by clinician on day of visit: allergies, medications, problem list, medical history, surgical history, family history, social history, and previous encounter notes. ? ?I, Tonye [name], am acting as Location manager for [employer], PA-C. ? ?I have reviewed the above documentation for accuracy and completeness, and I agree with the above. Abby Potash, PA-C ? ?

## 2021-10-06 ENCOUNTER — Other Ambulatory Visit: Payer: Self-pay

## 2021-10-27 ENCOUNTER — Ambulatory Visit
Admission: RE | Admit: 2021-10-27 | Discharge: 2021-10-27 | Disposition: A | Discharge status: Home / Self Care | Payer: 59 | Source: Ambulatory Visit | Attending: Family Medicine | Admitting: Family Medicine

## 2021-10-27 DIAGNOSIS — Z1231 Encounter for screening mammogram for malignant neoplasm of breast: Secondary | ICD-10-CM | POA: Diagnosis not present

## 2021-10-28 ENCOUNTER — Other Ambulatory Visit: Payer: Self-pay

## 2021-10-28 ENCOUNTER — Other Ambulatory Visit (INDEPENDENT_AMBULATORY_CARE_PROVIDER_SITE_OTHER): Payer: Self-pay | Admitting: Physician Assistant

## 2021-10-28 DIAGNOSIS — R632 Polyphagia: Secondary | ICD-10-CM

## 2021-11-09 ENCOUNTER — Encounter (INDEPENDENT_AMBULATORY_CARE_PROVIDER_SITE_OTHER): Payer: Self-pay | Admitting: Bariatrics

## 2021-11-09 ENCOUNTER — Ambulatory Visit (INDEPENDENT_AMBULATORY_CARE_PROVIDER_SITE_OTHER): Payer: 59 | Admitting: Bariatrics

## 2021-11-09 ENCOUNTER — Other Ambulatory Visit: Payer: Self-pay

## 2021-11-09 VITALS — BP 126/74 | HR 77 | Temp 97.9°F | Ht 65.0 in | Wt 190.0 lb

## 2021-11-09 DIAGNOSIS — E669 Obesity, unspecified: Secondary | ICD-10-CM | POA: Diagnosis not present

## 2021-11-09 DIAGNOSIS — R7303 Prediabetes: Secondary | ICD-10-CM | POA: Diagnosis not present

## 2021-11-09 DIAGNOSIS — E7800 Pure hypercholesterolemia, unspecified: Secondary | ICD-10-CM | POA: Diagnosis not present

## 2021-11-09 DIAGNOSIS — E559 Vitamin D deficiency, unspecified: Secondary | ICD-10-CM | POA: Diagnosis not present

## 2021-11-09 DIAGNOSIS — L659 Nonscarring hair loss, unspecified: Secondary | ICD-10-CM | POA: Diagnosis not present

## 2021-11-09 DIAGNOSIS — R632 Polyphagia: Secondary | ICD-10-CM

## 2021-11-09 DIAGNOSIS — Z6833 Body mass index (BMI) 33.0-33.9, adult: Secondary | ICD-10-CM

## 2021-11-09 DIAGNOSIS — Z6831 Body mass index (BMI) 31.0-31.9, adult: Secondary | ICD-10-CM

## 2021-11-09 DIAGNOSIS — I10 Essential (primary) hypertension: Secondary | ICD-10-CM

## 2021-11-09 MED ORDER — WEGOVY 0.5 MG/0.5ML ~~LOC~~ SOAJ
0.5000 mg | SUBCUTANEOUS | 0 refills | Status: DC
  Filled 2021-11-09: qty 2, 28d supply, fill #0

## 2021-11-10 LAB — HEMOGLOBIN A1C
Est. average glucose Bld gHb Est-mCnc: 114 mg/dL
Hgb A1c MFr Bld: 5.6 % (ref 4.8–5.6)

## 2021-11-10 LAB — LIPID PANEL WITH LDL/HDL RATIO
Cholesterol, Total: 218 mg/dL — ABNORMAL HIGH (ref 100–199)
HDL: 71 mg/dL (ref >39)
LDL Chol Calc (NIH): 125 mg/dL — ABNORMAL HIGH (ref 0–99)
LDL/HDL Ratio: 1.8 ratio (ref 0.0–3.2)
Triglycerides: 126 mg/dL (ref 0–149)
VLDL Cholesterol Cal: 22 mg/dL (ref 5–40)

## 2021-11-10 LAB — INSULIN, RANDOM: INSULIN: 20.1 u[IU]/mL (ref 2.6–24.9)

## 2021-11-10 LAB — VITAMIN D 25 HYDROXY (VIT D DEFICIENCY, FRACTURES): Vit D, 25-Hydroxy: 41.6 ng/mL (ref 30.0–100.0)

## 2021-11-16 NOTE — Progress Notes (Signed)
? ? ? ?Chief Complaint:  ? ?OBESITY ?Kristy Travis is here to discuss her progress with her obesity treatment plan along with follow-up of her obesity related diagnoses. Kristy Travis is on the [employer] and states she is following her eating plan approximately 100% of the time. Kristy Travis states she is walking for 30 minutes 3 times per week. ? ?Today's visit was #: 5 ?Starting weight: 203 lbs ?Starting date: 08/05/2021 ?Today's weight: 190 lbs ?Today's date: 11/09/2021 ?Total lbs lost to date: 13 lbs ?Total lbs lost since last in-office visit: 2 lbs ? ?Interim History: Kristy Travis is down 2 lbs since her last visit.  ? ?Subjective:  ? ?1. Polyphagia ?Kristy Travis is currently taking Wegovy. She is out of [facility]. She states it helps with appetite.  ? ?2. Essential hypertension ?Kristy Travis's blood pressure is controlled. Her blood pressure today was 126/74. ? ?3. Hair loss ?Kristy Travis notes hair loss.  ? ?4. Elevated cholesterol ?Kristy Travis is not on medications currently.  ? ?5. Vitamin D deficiency ?Kristy Travis is currently taking Vitamin D.  ? ?6. Prediabetes ?Kristy Travis's last A1C was 6.1. Her insulin was 12.5. ? ?Assessment/Plan:  ? ?1. Polyphagia ?Intensive lifestyle modifications are the first line treatment for this issue. We will refill Wegovy 0.5 mg for 1 month with no refills. We will check A1C and insulin today. We discussed several lifestyle modifications today and she will continue to work on diet, exercise and weight loss efforts. Orders and follow up as documented in patient record. ? ?Counseling ?Polyphagia is excessive hunger. ?Causes can include: low blood sugars, hypERthyroidism, PMS, lack of sleep, stress, insulin resistance, diabetes, certain medications, and diets that are deficient in protein and fiber.   ? ?- Semaglutide-Weight Management (WEGOVY) 0.5 MG/0.5ML SOAJ; Inject 0.5 mg into the skin once a week.  Dispense: 2 mL; Refill: 0 ?- Insulin, random ?- Hemoglobin A1c ? ?2. Essential hypertension ?Kristy Travis will continue her medications.  She is working on healthy weight loss and exercise to improve blood pressure control. We will watch for signs of hypotension as she continues her lifestyle modifications. ? ?3. Hair loss ?Kristy Travis will buy over the counter medications (collagens gummies or powder, hyaluronic acid).  ? ?4. Elevated cholesterol ?Cardiovascular risk and specific lipid/LDL goals reviewed.  We will check Llipids today. We discussed several lifestyle modifications today and Kristy Travis will continue to work on diet, exercise and weight loss efforts. Orders and follow up as documented in patient record.  ? ?Counseling ?Intensive lifestyle modifications are the first line treatment for this issue. ?Dietary changes: Increase soluble fiber. Decrease simple carbohydrates. ?Exercise changes: Moderate to vigorous-intensity aerobic activity 150 minutes per week if tolerated. ?Lipid-lowering medications: see documented in medical record. ? ?- Lipid Panel With LDL/HDL Ratio ? ?5. Vitamin D deficiency ?Low Vitamin D level contributes to fatigue and are associated with obesity, breast, and colon cancer. We will check Vitamin D level today and Kristy Travis will follow-up for routine testing of Vitamin D, at least 2-3 times per year to avoid over-replacement. ? ?- VITAMIN D 25 Hydroxy (Vit-D Deficiency, Fractures) ? ?6. Prediabetes ?Kristy Travis will continue to work on weight loss, exercise, and decreasing simple carbohydrates to help decrease the risk of diabetes. We will check insulin and A1C today.  ? ?- Insulin, random ?- Hemoglobin A1c ? ?7. Obesity, current BMI 31.7 ?Kristy Travis is currently in the action stage of change. As such, her goal is to continue with weight loss efforts. She has agreed to keeping a food journal and adhering to recommended goals of  1200 calories and 80 grams of protein and the Pescatarian Plan.  ? ?Kristy Travis will be mindful eating. She will continue to adhere closely to the plan. She will increase her water intake.  ? ?- Semaglutide-Weight  Management (WEGOVY) 0.5 MG/0.5ML SOAJ; Inject 0.5 mg into the skin once a week.  Dispense: 2 mL; Refill: 0 ? ?Exercise goals:  As is.  ? ?Behavioral modification strategies: increasing lean protein intake, decreasing simple carbohydrates, increasing vegetables, increasing water intake, decreasing eating out, no skipping meals, meal planning and cooking strategies, keeping healthy foods in the home, and planning for success. ? ?Kristy Travis has agreed to follow-up with our clinic in 3 weeks with nurse practitioner and 6 weeks with myself. She was informed of the importance of frequent follow-up visits to maximize her success with intensive lifestyle modifications for her multiple health conditions.  ? ?Kristy Travis was informed we would discuss her lab results at her next visit unless there is a critical issue that needs to be addressed sooner. Kristy Travis agreed to keep her next visit at the agreed upon time to discuss these results. ? ?Objective:  ? ?Blood pressure 126/74, pulse 77, temperature 97.9 ?F (36.6 ?C), height 5\' 5"  (1.651 m), weight 190 lb (86.2 kg), last menstrual period 06/24/2014, SpO2 99 %. ?Body mass index is 31.62 kg/m?. ? ?General: Cooperative, alert, well developed, in no acute distress. ?HEENT: Conjunctivae and lids unremarkable. ?Cardiovascular: Regular rhythm.  ?Lungs: Normal work of breathing. ?Neurologic: No focal deficits.  ? ?Lab Results  ?Component Value Date  ? CREATININE 0.62 09/12/2021  ? BUN 14 09/12/2021  ? NA 139 09/12/2021  ? K 3.6 09/12/2021  ? CL 104 09/12/2021  ? CO2 29 09/12/2021  ? ?Lab Results  ?Component Value Date  ? ALT 12 05/21/2021  ? AST 16 05/21/2021  ? ALKPHOS 89 05/21/2021  ? BILITOT 0.3 05/21/2021  ? ?Lab Results  ?Component Value Date  ? HGBA1C 5.6 11/09/2021  ? HGBA1C 6.1 (H) 05/21/2021  ? HGBA1C 5.8 (H) 01/22/2021  ? HGBA1C 5.6 01/17/2020  ? HGBA1C 5.6 01/17/2020  ? HGBA1C 5.6 (A) 01/17/2020  ? HGBA1C 5.6 01/17/2020  ? ?Lab Results  ?Component Value Date  ? INSULIN 20.1  11/09/2021  ? INSULIN 12.3 08/05/2021  ? ?Lab Results  ?Component Value Date  ? TSH 3.210 01/22/2021  ? ?Lab Results  ?Component Value Date  ? CHOL 218 (H) 11/09/2021  ? HDL 71 11/09/2021  ? LDLCALC 125 (H) 11/09/2021  ? TRIG 126 11/09/2021  ? CHOLHDL 3.3 05/21/2021  ? ?Lab Results  ?Component Value Date  ? VD25OH 41.6 11/09/2021  ? VD25OH 38.2 05/21/2021  ? VD25OH 20.6 (L) 01/22/2021  ? ?Lab Results  ?Component Value Date  ? WBC 7.5 09/12/2021  ? HGB 11.9 (L) 09/12/2021  ? HCT 38.8 09/12/2021  ? MCV 84.7 09/12/2021  ? PLT 264 09/12/2021  ? ?Lab Results  ?Component Value Date  ? IRON 129 02/18/2015  ? TIBC 390 08/16/2013  ? FERRITIN 16 02/18/2015  ? ?Attestation Statements:  ? ?Reviewed by clinician on day of visit: allergies, medications, problem list, medical history, surgical history, family history, social history, and previous encounter notes. ? ?I, Lizbeth Bark, RMA, am acting as transcriptionist for [employer], DO. ? ?I have reviewed the above documentation for accuracy and completeness, and I agree with the above. Jearld Lesch, DO ? ?

## 2021-11-17 ENCOUNTER — Encounter (INDEPENDENT_AMBULATORY_CARE_PROVIDER_SITE_OTHER): Payer: Self-pay | Admitting: Bariatrics

## 2021-11-24 ENCOUNTER — Telehealth: Payer: Self-pay | Admitting: Family Medicine

## 2021-11-24 ENCOUNTER — Other Ambulatory Visit: Payer: Self-pay

## 2021-11-24 ENCOUNTER — Encounter: Payer: Self-pay | Admitting: Family Medicine

## 2021-11-24 ENCOUNTER — Ambulatory Visit (INDEPENDENT_AMBULATORY_CARE_PROVIDER_SITE_OTHER): Payer: 59 | Admitting: Family Medicine

## 2021-11-24 ENCOUNTER — Other Ambulatory Visit: Payer: Self-pay | Admitting: Family Medicine

## 2021-11-24 VITALS — BP 118/72 | HR 82 | Ht 65.0 in | Wt 192.1 lb

## 2021-11-24 DIAGNOSIS — M25572 Pain in left ankle and joints of left foot: Secondary | ICD-10-CM | POA: Diagnosis not present

## 2021-11-24 DIAGNOSIS — G8929 Other chronic pain: Secondary | ICD-10-CM | POA: Diagnosis not present

## 2021-11-24 DIAGNOSIS — E663 Overweight: Secondary | ICD-10-CM | POA: Insufficient documentation

## 2021-11-24 DIAGNOSIS — E559 Vitamin D deficiency, unspecified: Secondary | ICD-10-CM | POA: Diagnosis not present

## 2021-11-24 DIAGNOSIS — E669 Obesity, unspecified: Secondary | ICD-10-CM | POA: Insufficient documentation

## 2021-11-24 DIAGNOSIS — E7849 Other hyperlipidemia: Secondary | ICD-10-CM

## 2021-11-24 DIAGNOSIS — G9332 Myalgic encephalomyelitis/chronic fatigue syndrome: Secondary | ICD-10-CM | POA: Diagnosis not present

## 2021-11-24 DIAGNOSIS — M7122 Synovial cyst of popliteal space [Baker], left knee: Secondary | ICD-10-CM | POA: Insufficient documentation

## 2021-11-24 DIAGNOSIS — M1712 Unilateral primary osteoarthritis, left knee: Secondary | ICD-10-CM | POA: Diagnosis not present

## 2021-11-24 MED ORDER — SEMAGLUTIDE-WEIGHT MANAGEMENT 0.5 MG/0.5ML ~~LOC~~ SOAJ
0.5000 mg | SUBCUTANEOUS | 3 refills | Status: DC
  Filled 2021-11-24: qty 2, 28d supply, fill #0

## 2021-11-24 NOTE — Telephone Encounter (Signed)
Pt was seen today & was referred to ortho. States the ortho that the referral was sent to today was the same one she saw last time who told her she no longer needed to schedule appts. Can you please refer her to someone else? ?

## 2021-11-24 NOTE — Assessment & Plan Note (Signed)
Symptomatic, with osteo of knee refer Orthopedics ?

## 2021-11-24 NOTE — Assessment & Plan Note (Signed)
Hyperlipidemia:Low fat diet discussed and encouraged. ? ? ?Lipid Panel  ?Lab Results  ?Component Value Date  ? CHOL 218 (H) 11/09/2021  ? HDL 71 11/09/2021  ? LDLCALC 125 (H) 11/09/2021  ? TRIG 126 11/09/2021  ? CHOLHDL 3.3 05/21/2021  ? ? ? ?Improved with change in diet ?

## 2021-11-24 NOTE — Assessment & Plan Note (Signed)
Corrected, continue current med ?

## 2021-11-24 NOTE — Assessment & Plan Note (Signed)
Improved, continue wegovy at current dose ? ?Patient re-educated about  the importance of commitment to a  minimum of 150 minutes of exercise per week as able. ? ?The importance of healthy food choices with portion control discussed, as well as eating regularly and within a 12 hour window most days. ?The need to choose "clean , green" food 50 to 75% of the time is discussed, as well as to make water the primary drink and set a goal of 64 ounces water daily. ? ?  ? ?  11/24/2021  ? 11:18 AM 11/09/2021  ?  9:00 AM 10/05/2021  ?  9:00 AM  ?Weight /BMI  ?Weight 192 lb 1.9 oz 190 lb 192 lb  ?Height 5\' 5"  (1.651 m) 5\' 5"  (1.651 m) 5\' 5"  (1.651 m)  ?BMI 31.97 kg/m2 31.62 kg/m2 31.95 kg/m2  ? ? ? ?

## 2021-11-24 NOTE — Assessment & Plan Note (Signed)
Pain, swelling, deformity, new dx of baker's cyst, Ortho eval ?

## 2021-11-24 NOTE — Patient Instructions (Signed)
Annual with pap in 3 months,all if you need me sooner ? ?Congrats on weight loss, keep it up and startwater aerobics please ? ?Weight loss goal of 4 pounds/ month ? ?You are referred re left knee pain and baker's cyst. ? ?You are referred to Dr Sharol Given re left ankle pain  ? ?Thanks for choosing [facility], we consider it a privelige to serve you. ? ?

## 2021-11-24 NOTE — Telephone Encounter (Signed)
Referred to [facility]  ?

## 2021-11-24 NOTE — Telephone Encounter (Signed)
Will refer to [facility]  ?

## 2021-11-24 NOTE — Assessment & Plan Note (Signed)
Encouraged water aerobics ?

## 2021-11-24 NOTE — Telephone Encounter (Signed)
Please advise - who do you recommend she be referred to? ?

## 2021-11-24 NOTE — Progress Notes (Signed)
? ?Kristy Travis     MRN: [id number]      DOB: 1966-02-12 ? ? ?HPI ?Kristy Travis is here for follow up and re-evaluation of chronic medical conditions, medication management and review of any available recent lab and radiology data.  ?Preventive health is updated, specifically  Cancer screening and Immunization.   ?Questions or concerns regarding consultations or procedures which the PT has had in the interim are  addressed.Started wegovy with weight management has lost 8 pounds, eats over a 6 hr period doing well ?C/o chronic left ankle wants new Ortho eval of same ?C/o left knee pain and baker's cyst which is painful, recently diagnosed in ED ?The PT denies any adverse reactions to current medications since the last visit.  ?Chronic fatigue persists ? ?ROS ?Denies recent fever or chills. ?Denies sinus pressure, nasal congestion, ear pain or sore throat. ?Denies chest congestion, productive cough or wheezing. ?Denies chest pains, palpitations and leg swelling ?Denies abdominal pain, nausea, vomiting,diarrhea or constipation.   ?Denies dysuria, frequency, hesitancy or incontinence. ?. ?Denies headaches, seizures, numbness, or tingling. ?Denies depression, anxiety or insomnia. ?Denies skin break down or rash. ? ? ?PE ? ?BP 118/72   Pulse 82   Ht 5\' 5"  (1.651 m)   Wt 192 lb 1.9 oz (87.1 kg)   LMP 06/24/2014   SpO2 98%   BMI 31.97 kg/m?  ? ? ?Patient alert and oriented and in no cardiopulmonary distress. ? ?HEENT: No facial asymmetry, EOMI,     Neck supple . ? ?Chest: Clear to auscultation bilaterally. ? ?CVS: S1, S2 no murmurs, no S3.Regular rate. ? ?ABD: Soft non tender.  ? ?Ext: No edema ? ?MS: Adequate ROM spine, shoulders, hips and reduced in left  knee which is tender and swollen , also reduced ROM left knee which is deformed ? ?Skin: Intact, no ulcerations or rash noted. ? ?Psych: Good eye contact, normal affect. Memory intact not anxious or depressed appearing. ? ?CNS: CN 2-12 intact, power,   normal throughout.no focal deficits noted. ? ? ?Assessment & Plan ? ? ?Primary osteoarthritis of left knee ?Pain, swelling, deformity, new dx of baker's cyst, Ortho eval ? ?Baker's cyst of knee, left ?Symptomatic, with osteo of knee refer Orthopedics ? ?Hyperlipidemia ?Hyperlipidemia:Low fat diet discussed and encouraged. ? ? ?Lipid Panel  ?Lab Results  ?Component Value Date  ? CHOL 218 (H) 11/09/2021  ? HDL 71 11/09/2021  ? LDLCALC 125 (H) 11/09/2021  ? TRIG 126 11/09/2021  ? CHOLHDL 3.3 05/21/2021  ? ? ? ?Improved with change in diet ? ?Vitamin D deficiency ?Corrected, continue current med ? ?Chronic fatigue disorder ?Encouraged water aerobics ? ?Obesity (BMI 30.0-34.9) ?Improved, continue wegovy at current dose ? ?Patient re-educated about  the importance of commitment to a  minimum of 150 minutes of exercise per week as able. ? ?The importance of healthy food choices with portion control discussed, as well as eating regularly and within a 12 hour window most days. ?The need to choose "clean , green" food 50 to 75% of the time is discussed, as well as to make water the primary drink and set a goal of 64 ounces water daily. ? ?  ? ?  11/24/2021  ? 11:18 AM 11/09/2021  ?  9:00 AM 10/05/2021  ?  9:00 AM  ?Weight /BMI  ?Weight 192 lb 1.9 oz 190 lb 192 lb  ?Height 5\' 5"  (1.651 m) 5\' 5"  (1.651 m) 5\' 5"  (1.651 m)  ?BMI 31.97 kg/m2 31.62 kg/m2 31.95  kg/m2  ? ? ? ? ?

## 2021-11-25 ENCOUNTER — Other Ambulatory Visit: Payer: Self-pay | Admitting: Family Medicine

## 2021-11-25 ENCOUNTER — Other Ambulatory Visit: Payer: Self-pay

## 2021-11-25 ENCOUNTER — Telehealth: Payer: Self-pay | Admitting: Family Medicine

## 2021-11-25 MED ORDER — WEGOVY 0.25 MG/0.5ML ~~LOC~~ SOAJ
0.2500 mg | SUBCUTANEOUS | 0 refills | Status: DC
  Filled 2021-11-25: qty 2, 28d supply, fill #0

## 2021-11-25 NOTE — Telephone Encounter (Signed)
Pt called stating the rx for [place] is on back order and will not be available until September. She is wanting to know if she can please get an rx called in for a lower does to take until then? ? ? ?[facility]  ?

## 2021-11-26 ENCOUNTER — Other Ambulatory Visit: Payer: Self-pay

## 2021-11-26 NOTE — Progress Notes (Signed)
TeleHealth Visit:  This visit was completed with telemedicine (audio/video) technology. [name] has verbally consented to this TeleHealth visit. The patient is located at home, the provider is located at home. The participants in this visit include the listed provider and patient. The visit was conducted today via MyChart video.  OBESITY Kristy Travis is here to discuss her progress with her obesity treatment plan along with follow-up of her obesity related diagnoses.   Today's visit was # 6 Starting weight: 203 lbs Starting date: 08/05/2021 Weight at last in office visit: 190 lbs on 11/09/21 Total weight loss: 13 lbs at last in office visit on 11/09/21. Today's reported weight: 192 lbs   Nutrition Plan: keeping a food journal and adhering to recommended goals of 1200 calories and 80 gms protein.  Hunger is moderately controlled. Cravings are poorly controlled.  Current exercise: walking a few times/week for 30 minutes. Occasionally goes to gym at work.  Interim History: [name] journals consistently on her days off but does not always journal on workdays.  Recently she has been working quite a bit of overtime.  However she eats the same every day.  When she journals she meets her calorie and protein goals. 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 Discussed her lab results today.  Assessment/Plan:  1. Vitamin D Deficiency Vitamin D is not at goal of 50 but is steadily increasing.  She is on weekly prescription Vitamin D 50,000 IU.  Lab Results  Component Value Date   VD25OH 41.6 11/09/2021   VD25OH 38.2 05/21/2021   VD25OH 20.6 (L) 01/22/2021    Plan: Continue prescription vitamin D 50,000 IU weekly.  2. Hyperlipidemia LDL is not at goal but has improved.  It was 179 in November 2022. Medication(s): None Patient denies myalgias.  Cardiovascular risk factors: obesity (BMI >= 30  kg/m2) and active but no cardiovascular exercise.  Lab Results  Component Value Date   CHOL 218 (H) 11/09/2021   HDL 71 11/09/2021   LDLCALC 125 (H) 11/09/2021   TRIG 126 11/09/2021   CHOLHDL 3.3 05/21/2021   Lab Results  Component Value Date   ALT 12 05/21/2021   AST 16 05/21/2021   ALKPHOS 89 05/21/2021   BILITOT 0.3 05/21/2021   The 10-year ASCVD risk score (Arnett DK, et al., 2019) is: 1.6%   Values used to calculate the score:     Age: 30 years     Sex: Female     Is Non-Hispanic African American: Yes     Diabetic: No     Tobacco smoker: No     Systolic Blood Pressure: 123456 mmHg     Is BP treated: No     HDL Cholesterol: 71 mg/dL     Total Cholesterol: 218 mg/dL  Plan: No statin indicated.  Encouraged her to start some form of cardio exercise.  Polyphagia Sherrise endorses excessive hunger.  Medication(s): She is prescribed Wegovy but has been unable to obtain her refill due to nationwide drug shortage. Effects of medication:  well controlled. Cravings are moderately controlled.   Plan: Discontinue [id number]. Start Saxenda 0.6 mg daily.  May titrate to 1.2 mg daily if no side effects. Brinlynn denies personal or family history of thyroid cancer, history of pancreatitis, or current cholelithiasis. [name] was informed of the most common side effects (nausea, constipation, diarrhea).   Obesity: Current BMI 31.7 [name] is currently in the action stage of change. As  such, her goal is to continue with weight loss efforts.  She has agreed to keeping a food journal and adhering to recommended goals of 1200 calories and 80 gms protein.   Exercise goals: Begin some form of cardio exercise.  Behavioral modification strategies: increasing lean protein intake, decreasing simple carbohydrates, and keeping a strict food journal.  Brynnly has agreed to follow-up with our clinic in 3 weeks.   No orders of the defined types were placed in this encounter.   Medications Discontinued  During This Encounter  Medication Reason   Semaglutide-Weight Management (WEGOVY) 0.25 MG/0.5ML [facility]     Meds ordered this encounter  Medications   Liraglutide -Weight Management (SAXENDA) 18 MG/3ML SOPN    Sig: Inject 3 mg into the skin daily.    Dispense:  15 mL    Refill:  0    pack size    Order Specific Question:   Supervising Provider    Answer:   Dennard Nip D [[id number]]   Insulin Pen Needle (UNIFINE PENTIPS) 32G X 4 MM MISC    Sig: Use 1 needle daily to inject medication    Dispense:  100 each    Refill:  0    Order Specific Question:   Supervising Provider    Answer:   Dennard Nip D [[id number]]      Objective:   VITALS: Per patient if applicable, see vitals. GENERAL: Alert and in no acute distress. CARDIOPULMONARY: No increased WOB. Speaking in clear sentences.  PSYCH: Pleasant and cooperative. Speech normal rate and rhythm. Affect is appropriate. Insight and judgement are appropriate. Attention is focused, linear, and appropriate.  NEURO: Oriented as arrived to appointment on time with no prompting.   Lab Results  Component Value Date   CREATININE 0.62 09/12/2021   BUN 14 09/12/2021   NA 139 09/12/2021   K 3.6 09/12/2021   CL 104 09/12/2021   CO2 29 09/12/2021   Lab Results  Component Value Date   ALT 12 05/21/2021   AST 16 05/21/2021   ALKPHOS 89 05/21/2021   BILITOT 0.3 05/21/2021   Lab Results  Component Value Date   HGBA1C 5.6 11/09/2021   HGBA1C 6.1 (H) 05/21/2021   HGBA1C 5.8 (H) 01/22/2021   HGBA1C 5.6 01/17/2020   HGBA1C 5.6 01/17/2020   HGBA1C 5.6 (A) 01/17/2020   HGBA1C 5.6 01/17/2020   Lab Results  Component Value Date   INSULIN 20.1 11/09/2021   INSULIN 12.3 08/05/2021   Lab Results  Component Value Date   TSH 3.210 01/22/2021   Lab Results  Component Value Date   CHOL 218 (H) 11/09/2021   HDL 71 11/09/2021   LDLCALC 125 (H) 11/09/2021   TRIG 126 11/09/2021   CHOLHDL 3.3 05/21/2021   Lab Results   Component Value Date   WBC 7.5 09/12/2021   HGB 11.9 (L) 09/12/2021   HCT 38.8 09/12/2021   MCV 84.7 09/12/2021   PLT 264 09/12/2021   Lab Results  Component Value Date   IRON 129 02/18/2015   TIBC 390 08/16/2013   FERRITIN 16 02/18/2015   Lab Results  Component Value Date   VD25OH 41.6 11/09/2021   VD25OH 38.2 05/21/2021   VD25OH 20.6 (L) 01/22/2021    Attestation Statements:   Reviewed by clinician on day of visit: allergies, medications, problem list, medical history, surgical history, family history, social history, and previous encounter notes.

## 2021-11-26 NOTE — Telephone Encounter (Signed)
Patient aware.

## 2021-11-30 ENCOUNTER — Telehealth (INDEPENDENT_AMBULATORY_CARE_PROVIDER_SITE_OTHER): Payer: 59 | Admitting: Family Medicine

## 2021-11-30 ENCOUNTER — Other Ambulatory Visit: Payer: Self-pay

## 2021-11-30 ENCOUNTER — Encounter (INDEPENDENT_AMBULATORY_CARE_PROVIDER_SITE_OTHER): Payer: Self-pay | Admitting: Family Medicine

## 2021-11-30 DIAGNOSIS — E669 Obesity, unspecified: Secondary | ICD-10-CM

## 2021-11-30 DIAGNOSIS — E7849 Other hyperlipidemia: Secondary | ICD-10-CM | POA: Diagnosis not present

## 2021-11-30 DIAGNOSIS — R632 Polyphagia: Secondary | ICD-10-CM

## 2021-11-30 DIAGNOSIS — E559 Vitamin D deficiency, unspecified: Secondary | ICD-10-CM | POA: Diagnosis not present

## 2021-11-30 DIAGNOSIS — Z6831 Body mass index (BMI) 31.0-31.9, adult: Secondary | ICD-10-CM | POA: Diagnosis not present

## 2021-11-30 MED ORDER — UNIFINE PENTIPS 32G X 4 MM MISC
0 refills | Status: AC
Start: 2021-11-30 — End: ?
  Filled 2021-11-30 – 2021-12-02 (×2): qty 100, 90d supply, fill #0

## 2021-11-30 MED ORDER — SAXENDA 18 MG/3ML ~~LOC~~ SOPN
3.0000 mg | PEN_INJECTOR | Freq: Every day | SUBCUTANEOUS | 0 refills | Status: DC
  Filled 2021-11-30 – 2021-12-02 (×2): qty 15, 30d supply, fill #0

## 2021-12-01 ENCOUNTER — Other Ambulatory Visit: Payer: Self-pay

## 2021-12-02 ENCOUNTER — Telehealth (INDEPENDENT_AMBULATORY_CARE_PROVIDER_SITE_OTHER): Payer: Self-pay | Admitting: Family Medicine

## 2021-12-02 ENCOUNTER — Encounter (INDEPENDENT_AMBULATORY_CARE_PROVIDER_SITE_OTHER): Payer: Self-pay

## 2021-12-02 ENCOUNTER — Other Ambulatory Visit: Payer: Self-pay

## 2021-12-02 NOTE — Telephone Encounter (Signed)
Kristy Travis - Prior authorization approved for [employer]. Effective: 12/01/2021 to 03/23/2022. Patient sent approval message via mychart.

## 2021-12-03 ENCOUNTER — Other Ambulatory Visit: Payer: Self-pay

## 2021-12-18 ENCOUNTER — Other Ambulatory Visit: Payer: Self-pay

## 2021-12-18 ENCOUNTER — Other Ambulatory Visit: Payer: Self-pay | Admitting: Family Medicine

## 2021-12-18 DIAGNOSIS — K219 Gastro-esophageal reflux disease without esophagitis: Secondary | ICD-10-CM

## 2021-12-20 NOTE — Progress Notes (Unsigned)
TeleHealth Visit:  This visit was completed with telemedicine (audio/video) technology. Kristy Travis has verbally consented to this TeleHealth visit. The patient is located at home, the provider is located at home. The participants in this visit include the listed provider and patient. The visit was conducted today via MyChart video.  OBESITY Kristy Travis is here to discuss her progress with her obesity treatment plan along with follow-up of her obesity related diagnoses.   Today's visit was # 7 Starting weight: 203 lbs Starting date: 08/05/2021 Weight at last in office visit: 190 lbs on 11/09/21 Total weight loss: 13 lbs at last in office visit on 11/09/21. Today's reported weight: *** lbs   Nutrition Plan: keeping a food journal and adhering to recommended goals of 1200 calories and 80 gms protein.  Hunger is {EWCONTROLASSESSMENT:24261}. Cravings are {EWCONTROLASSESSMENT:24261}.  Current exercise: {exercise types:16438}  Interim History: ***  Assessment/Plan:  1. ***  2. ***  3. ***  Obesity: Current BMI *** Kristy Travis {CHL AMB IS/IS NOT:210130109} currently in the action stage of change. As such, her goal is to {MWMwtloss#1:210800005}.  She has agreed to {MWMwtlossportion/plan2:23431}.   Exercise goals: {MWM EXERCISE RECS:23473}  Behavioral modification strategies: {MWMwtlossdietstrategies3:23432}.  Kristy Travis has agreed to follow-up with our clinic in {NUMBER 1-10:22536} weeks.   No orders of the defined types were placed in this encounter.   There are no discontinued medications.   No orders of the defined types were placed in this encounter.     Objective:   VITALS: Per patient if applicable, see vitals. GENERAL: Alert and in no acute distress. CARDIOPULMONARY: No increased WOB. Speaking in clear sentences.  PSYCH: Pleasant and cooperative. Speech normal rate and rhythm. Affect is appropriate. Insight and judgement are appropriate. Attention is focused, linear, and  appropriate.  NEURO: Oriented as arrived to appointment on time with no prompting.   Lab Results  Component Value Date   CREATININE 0.62 09/12/2021   BUN 14 09/12/2021   NA 139 09/12/2021   K 3.6 09/12/2021   CL 104 09/12/2021   CO2 29 09/12/2021   Lab Results  Component Value Date   ALT 12 05/21/2021   AST 16 05/21/2021   ALKPHOS 89 05/21/2021   BILITOT 0.3 05/21/2021   Lab Results  Component Value Date   HGBA1C 5.6 11/09/2021   HGBA1C 6.1 (H) 05/21/2021   HGBA1C 5.8 (H) 01/22/2021   HGBA1C 5.6 01/17/2020   HGBA1C 5.6 01/17/2020   HGBA1C 5.6 (A) 01/17/2020   HGBA1C 5.6 01/17/2020   Lab Results  Component Value Date   INSULIN 20.1 11/09/2021   INSULIN 12.3 08/05/2021   Lab Results  Component Value Date   TSH 3.210 01/22/2021   Lab Results  Component Value Date   CHOL 218 (H) 11/09/2021   HDL 71 11/09/2021   LDLCALC 125 (H) 11/09/2021   TRIG 126 11/09/2021   CHOLHDL 3.3 05/21/2021   Lab Results  Component Value Date   WBC 7.5 09/12/2021   HGB 11.9 (L) 09/12/2021   HCT 38.8 09/12/2021   MCV 84.7 09/12/2021   PLT 264 09/12/2021   Lab Results  Component Value Date   IRON 129 02/18/2015   TIBC 390 08/16/2013   FERRITIN 16 02/18/2015   Lab Results  Component Value Date   VD25OH 41.6 11/09/2021   VD25OH 38.2 05/21/2021   VD25OH 20.6 (L) 01/22/2021    Attestation Statements:   Reviewed by clinician on day of visit: allergies, medications, problem list, medical history, surgical history, family history, social history, and  previous encounter notes.  ***(delete if time-based billing not used)Time spent on visit including pre-visit chart review and post-visit charting and care was *** minutes.  This time may include: -preparing to see the patient (e.g., review of tests) -obtaining and/or reviewing separately obtained history -performing a medically appropriate examination and/or evaluation -counseling and educating the  patient/family/caregiver -ordering medications, tests, or procedures -referring and communicating with other health care professionals (when not separately reported) -documenting clinical information in the electronic or other health record -independently interpreting results (not separately reported) and communicating results to the patient/ family/caregiver -care coordination (not separately reported)

## 2021-12-21 ENCOUNTER — Other Ambulatory Visit: Payer: Self-pay

## 2021-12-21 ENCOUNTER — Telehealth (INDEPENDENT_AMBULATORY_CARE_PROVIDER_SITE_OTHER): Payer: 59 | Admitting: Family Medicine

## 2021-12-21 ENCOUNTER — Encounter (INDEPENDENT_AMBULATORY_CARE_PROVIDER_SITE_OTHER): Payer: Self-pay | Admitting: Family Medicine

## 2021-12-21 DIAGNOSIS — E559 Vitamin D deficiency, unspecified: Secondary | ICD-10-CM

## 2021-12-21 DIAGNOSIS — E669 Obesity, unspecified: Secondary | ICD-10-CM

## 2021-12-21 DIAGNOSIS — Z7985 Long-term (current) use of injectable non-insulin antidiabetic drugs: Secondary | ICD-10-CM

## 2021-12-21 DIAGNOSIS — R632 Polyphagia: Secondary | ICD-10-CM

## 2021-12-21 DIAGNOSIS — Z6831 Body mass index (BMI) 31.0-31.9, adult: Secondary | ICD-10-CM | POA: Diagnosis not present

## 2021-12-21 DIAGNOSIS — G4700 Insomnia, unspecified: Secondary | ICD-10-CM | POA: Diagnosis not present

## 2021-12-21 MED ORDER — ERGOCALCIFEROL 1.25 MG (50000 UT) PO CAPS
50000.0000 [IU] | ORAL_CAPSULE | ORAL | 2 refills | Status: DC
  Filled 2021-12-21: qty 12, 84d supply, fill #0

## 2021-12-21 MED ORDER — TRAZODONE HCL 50 MG PO TABS: ORAL_TABLET | ORAL | 1 refills | Status: DC

## 2021-12-22 ENCOUNTER — Other Ambulatory Visit: Payer: Self-pay

## 2021-12-25 ENCOUNTER — Other Ambulatory Visit: Payer: Self-pay

## 2021-12-25 ENCOUNTER — Other Ambulatory Visit: Payer: Self-pay | Admitting: Family Medicine

## 2021-12-25 MED ORDER — OMEPRAZOLE 20 MG PO CPDR
20.0000 mg | DELAYED_RELEASE_CAPSULE | Freq: Two times a day (BID) | ORAL | 3 refills | Status: DC
  Filled 2021-12-25: qty 180, 90d supply, fill #0

## 2021-12-28 DIAGNOSIS — M67962 Unspecified disorder of synovium and tendon, left lower leg: Secondary | ICD-10-CM | POA: Diagnosis not present

## 2021-12-28 DIAGNOSIS — M25572 Pain in left ankle and joints of left foot: Secondary | ICD-10-CM | POA: Diagnosis not present

## 2021-12-28 DIAGNOSIS — M216X2 Other acquired deformities of left foot: Secondary | ICD-10-CM | POA: Diagnosis not present

## 2021-12-29 ENCOUNTER — Other Ambulatory Visit: Payer: Self-pay

## 2021-12-29 MED ORDER — DICLOFENAC SODIUM 1 % EX GEL
CUTANEOUS | 1 refills | Status: DC
  Filled 2021-12-29: qty 300, 50d supply, fill #0

## 2021-12-30 ENCOUNTER — Other Ambulatory Visit: Payer: Self-pay

## 2022-01-07 ENCOUNTER — Ambulatory Visit (INDEPENDENT_AMBULATORY_CARE_PROVIDER_SITE_OTHER): Payer: 59 | Admitting: Bariatrics

## 2022-01-07 ENCOUNTER — Other Ambulatory Visit: Payer: Self-pay

## 2022-01-07 ENCOUNTER — Encounter (INDEPENDENT_AMBULATORY_CARE_PROVIDER_SITE_OTHER): Payer: Self-pay | Admitting: Bariatrics

## 2022-01-07 ENCOUNTER — Other Ambulatory Visit (HOSPITAL_COMMUNITY): Payer: Self-pay

## 2022-01-07 VITALS — BP 95/63 | HR 74 | Temp 97.8°F | Ht 65.0 in | Wt 188.0 lb

## 2022-01-07 DIAGNOSIS — R632 Polyphagia: Secondary | ICD-10-CM

## 2022-01-07 DIAGNOSIS — Z6831 Body mass index (BMI) 31.0-31.9, adult: Secondary | ICD-10-CM | POA: Diagnosis not present

## 2022-01-07 DIAGNOSIS — I9589 Other hypotension: Secondary | ICD-10-CM

## 2022-01-07 DIAGNOSIS — E669 Obesity, unspecified: Secondary | ICD-10-CM

## 2022-01-07 DIAGNOSIS — R7303 Prediabetes: Secondary | ICD-10-CM | POA: Diagnosis not present

## 2022-01-07 MED ORDER — SAXENDA 18 MG/3ML ~~LOC~~ SOPN
3.0000 mg | PEN_INJECTOR | Freq: Every day | SUBCUTANEOUS | 0 refills | Status: DC
  Filled 2022-01-07: qty 15, 30d supply, fill #0

## 2022-01-07 MED ORDER — BLOOD PRESSURE MONITOR MISC
0 refills | Status: DC
  Filled 2022-01-07: qty 1, 1d supply, fill #0

## 2022-01-08 ENCOUNTER — Other Ambulatory Visit: Payer: Self-pay

## 2022-01-11 ENCOUNTER — Encounter (INDEPENDENT_AMBULATORY_CARE_PROVIDER_SITE_OTHER): Payer: Self-pay | Admitting: Bariatrics

## 2022-01-11 NOTE — Progress Notes (Signed)
Chief Complaint:   OBESITY Kristy Travis is here to discuss her progress with her obesity treatment plan along with follow-up of her obesity related diagnoses. [name] is on keeping a food journal and adhering to recommended goals of 1200 calories and 80 grams of protein daily and states she is following her eating plan approximately 100% of the time. [name] states she is walking for 30 minutes 2 times per week.  Today's visit was #: 8 Starting weight: 203 lbs Starting date: 08/05/2021 Today's weight: 188 lbs Today's date: 01/07/2022 Total lbs lost to date: 15 Total lbs lost since last in-office visit: 2  Interim History: [name] is down an additional 2 pounds since her last visit.  Subjective:   1. Polyphagia [name] notes [name] helps with her appetite.  2. Pre-diabetes [name] is currently taking [place].  3. Other specified hypotension [name]'s blood pressure is 95/63, and she denies lightheadedness.  Assessment/Plan:   1. Polyphagia Mikaelah will continue Saxenda 3 mg once daily, and we will refill for 1 month.  - Liraglutide -Weight Management (SAXENDA) 18 MG/3ML SOPN; Inject 3 mg into the skin daily.  Dispense: 15 mL; Refill: 0  2. Pre-diabetes [name] will continue Saxenda.  She will keep her carbohydrates low (both sugar and starches).  3. Other specified hypotension Blood pressure monitor cuff was sent to the pharmacy.  [name] will use as directed.  - Blood Pressure Monitor MISC; Use as directed for blood pressure reading  Dispense: 1 each; Refill: 0  4. Obesity, current BMI 31.3 [name] is currently in the action stage of change. As such, her goal is to continue with weight loss efforts. She has agreed to keeping a food journal and adhering to recommended goals of 1200 calories and 80 grams of protein daily or the Pescatarian Plan.   [name] is to keep her water intake high.    Exercise goals: As is.  Behavioral modification strategies: increasing lean protein  intake, decreasing simple carbohydrates, increasing vegetables, increasing water intake, decreasing eating out, no skipping meals, meal planning and cooking strategies, keeping healthy foods in the home, and planning for success.  [name] has agreed to follow-up with our clinic in 3 to 4 weeks. She was informed of the importance of frequent follow-up visits to maximize her success with intensive lifestyle modifications for her multiple health conditions.   Objective:   Blood pressure 95/63, pulse 74, temperature 97.8 F (36.6 C), height 5\' 5"  (1.651 m), weight 188 lb (85.3 kg), last menstrual period 06/24/2014, SpO2 98 %. Body mass index is 31.28 kg/m.  General: Cooperative, alert, well developed, in no acute distress. HEENT: Conjunctivae and lids unremarkable. Cardiovascular: Regular rhythm.  Lungs: Normal work of breathing. Neurologic: No focal deficits.   Lab Results  Component Value Date   CREATININE 0.62 09/12/2021   BUN 14 09/12/2021   NA 139 09/12/2021   K 3.6 09/12/2021   CL 104 09/12/2021   CO2 29 09/12/2021   Lab Results  Component Value Date   ALT 12 05/21/2021   AST 16 05/21/2021   ALKPHOS 89 05/21/2021   BILITOT 0.3 05/21/2021   Lab Results  Component Value Date   HGBA1C 5.6 11/09/2021   HGBA1C 6.1 (H) 05/21/2021   HGBA1C 5.8 (H) 01/22/2021   HGBA1C 5.6 01/17/2020   HGBA1C 5.6 01/17/2020   HGBA1C 5.6 (A) 01/17/2020   HGBA1C 5.6 01/17/2020   Lab Results  Component Value Date   INSULIN 20.1 11/09/2021   INSULIN 12.3 08/05/2021   Lab  Results  Component Value Date   TSH 3.210 01/22/2021   Lab Results  Component Value Date   CHOL 218 (H) 11/09/2021   HDL 71 11/09/2021   LDLCALC 125 (H) 11/09/2021   TRIG 126 11/09/2021   CHOLHDL 3.3 05/21/2021   Lab Results  Component Value Date   VD25OH 41.6 11/09/2021   VD25OH 38.2 05/21/2021   VD25OH 20.6 (L) 01/22/2021   Lab Results  Component Value Date   WBC 7.5 09/12/2021   HGB 11.9 (L) 09/12/2021    HCT 38.8 09/12/2021   MCV 84.7 09/12/2021   PLT 264 09/12/2021   Lab Results  Component Value Date   IRON 129 02/18/2015   TIBC 390 08/16/2013   FERRITIN 16 02/18/2015   Attestation Statements:   Reviewed by clinician on day of visit: allergies, medications, problem list, medical history, surgical history, family history, social history, and previous encounter notes.   Trude Mcburney, am acting as Energy manager for [employer], DO.  I have reviewed the above documentation for accuracy and completeness, and I agree with the above. Corinna Capra, DO

## 2022-01-28 NOTE — Progress Notes (Signed)
TeleHealth Visit:  This visit was completed with telemedicine (audio/video) technology. Kristy Travis has verbally consented to this TeleHealth visit. The patient is located at home, the provider is located at home. The participants in this visit include the listed provider and patient. The visit was conducted today via MyChart video.  OBESITY Kristy Travis is here to discuss her progress with her obesity treatment plan along with follow-up of her obesity related diagnoses.   Today's visit was # 9 Starting weight: 203 lbs Starting date: 08/05/2021 Weight at last in office visit: 188 lbs on 01/07/22 Total weight loss: 15 lbs at last in office visit on 01/07/22. Today's reported weight: No weight reported.  Nutrition Plan: keeping a food journal and adhering to recommended goals of 1200 calories and 80 gms protein.  Hunger is moderately controlled.  Current exercise: walking 30 minutes 2 day/week.  Interim History: Kristy Travis is journaling consistently and meeting protein and calorie goals.  Calories usually run about 1100/day.  Protein runs 80/day. She walks a few days a week and gets in several thousand steps daily at her job.  Assessment/Plan:  1. Polyphagia Kristy Travis endorses polyphagia.  She had been on Wegovy but this was discontinued due to availability issues.  She does not feel the Saxenda is as effective for appetite suppression. Medication(s): Saxenda 3 mg daily.  Denies constipation.  Has occasional mild nausea.  Plan: Discontinue Saxenda. New prescription for Wegovy 1.7 mg weekly.  2. Vitamin D Deficiency Vitamin D is not at goal-last A1c was 41.6 on 11/09/2021.  Level is improving on weekly prescription vitamin D. Lab Results  Component Value Date   VD25OH 41.6 11/09/2021   VD25OH 38.2 05/21/2021   VD25OH 20.6 (L) 01/22/2021    Plan: Refill prescription vitamin D 50,000 IU weekly.  3. Obesity: Current BMI 31.3 Kristy Travis is currently in the action stage of change. As such, her  goal is to continue with weight loss efforts.  She has agreed to keeping a food journal and adhering to recommended goals of 1200 calories and 80 gms protein. Marland Kitchen   Exercise goals:  as is  Behavioral modification strategies: increasing lean protein intake, decreasing simple carbohydrates, and planning for success.  Kristy Travis has agreed to follow-up with our clinic in 2 weeks.   No orders of the defined types were placed in this encounter.   Medications Discontinued During This Encounter  Medication Reason   Liraglutide -Weight Management (SAXENDA) 18 MG/3ML SOPN Change in therapy   ergocalciferol (VITAMIN D2) 1.25 MG (50000 UT) capsule Reorder     Meds ordered this encounter  Medications   Semaglutide-Weight Management (WEGOVY) 1.7 MG/0.75ML SOAJ    Sig: Inject 1.7 mg into the skin once a week.    Dispense:  3 mL    Refill:  0    Order Specific Question:   Supervising Provider    Answer:   Wilder Glade [[id number]]   ergocalciferol (VITAMIN D2) 1.25 MG (50000 UT) capsule    Sig: Take 1 capsule (50,000 Units total) by mouth once a week. One capsule once weekly    Dispense:  12 capsule    Refill:  2    Order Specific Question:   Supervising Provider    Answer:   Quillian Quince D [[id number]]      Objective:   VITALS: Per patient if applicable, see vitals. GENERAL: Alert and in no acute distress. CARDIOPULMONARY: No increased WOB. Speaking in clear sentences.  PSYCH: Pleasant and cooperative. Speech normal rate and rhythm. Affect is  appropriate. Insight and judgement are appropriate. Attention is focused, linear, and appropriate.  NEURO: Oriented as arrived to appointment on time with no prompting.   Lab Results  Component Value Date   CREATININE 0.62 09/12/2021   BUN 14 09/12/2021   NA 139 09/12/2021   K 3.6 09/12/2021   CL 104 09/12/2021   CO2 29 09/12/2021   Lab Results  Component Value Date   ALT 12 05/21/2021   AST 16 05/21/2021   ALKPHOS 89 05/21/2021   BILITOT 0.3  05/21/2021   Lab Results  Component Value Date   HGBA1C 5.6 11/09/2021   HGBA1C 6.1 (H) 05/21/2021   HGBA1C 5.8 (H) 01/22/2021   HGBA1C 5.6 01/17/2020   HGBA1C 5.6 01/17/2020   HGBA1C 5.6 (A) 01/17/2020   HGBA1C 5.6 01/17/2020   Lab Results  Component Value Date   INSULIN 20.1 11/09/2021   INSULIN 12.3 08/05/2021   Lab Results  Component Value Date   TSH 3.210 01/22/2021   Lab Results  Component Value Date   CHOL 218 (H) 11/09/2021   HDL 71 11/09/2021   LDLCALC 125 (H) 11/09/2021   TRIG 126 11/09/2021   CHOLHDL 3.3 05/21/2021   Lab Results  Component Value Date   WBC 7.5 09/12/2021   HGB 11.9 (L) 09/12/2021   HCT 38.8 09/12/2021   MCV 84.7 09/12/2021   PLT 264 09/12/2021   Lab Results  Component Value Date   IRON 129 02/18/2015   TIBC 390 08/16/2013   FERRITIN 16 02/18/2015   Lab Results  Component Value Date   VD25OH 41.6 11/09/2021   VD25OH 38.2 05/21/2021   VD25OH 20.6 (L) 01/22/2021    Attestation Statements:   Reviewed by clinician on day of visit: allergies, medications, problem list, medical history, surgical history, family history, social history, and previous encounter notes.

## 2022-02-01 ENCOUNTER — Telehealth (INDEPENDENT_AMBULATORY_CARE_PROVIDER_SITE_OTHER): Payer: 59 | Admitting: Family Medicine

## 2022-02-01 ENCOUNTER — Encounter (INDEPENDENT_AMBULATORY_CARE_PROVIDER_SITE_OTHER): Payer: Self-pay | Admitting: Family Medicine

## 2022-02-01 ENCOUNTER — Other Ambulatory Visit: Payer: Self-pay

## 2022-02-01 DIAGNOSIS — R632 Polyphagia: Secondary | ICD-10-CM | POA: Diagnosis not present

## 2022-02-01 DIAGNOSIS — E669 Obesity, unspecified: Secondary | ICD-10-CM | POA: Diagnosis not present

## 2022-02-01 DIAGNOSIS — E559 Vitamin D deficiency, unspecified: Secondary | ICD-10-CM | POA: Diagnosis not present

## 2022-02-01 DIAGNOSIS — Z6831 Body mass index (BMI) 31.0-31.9, adult: Secondary | ICD-10-CM

## 2022-02-01 MED ORDER — WEGOVY 1.7 MG/0.75ML ~~LOC~~ SOAJ
1.7000 mg | SUBCUTANEOUS | 0 refills | Status: DC
  Filled 2022-02-01 – 2022-02-04 (×2): qty 3, 28d supply, fill #0

## 2022-02-01 MED ORDER — ERGOCALCIFEROL 1.25 MG (50000 UT) PO CAPS
50000.0000 [IU] | ORAL_CAPSULE | ORAL | 2 refills | Status: DC
  Filled 2022-02-01: qty 12, 84d supply, fill #0

## 2022-02-01 NOTE — Progress Notes (Signed)
Patient was a no-show for virtual visit. Left VM advising patient to call to reschedule.  

## 2022-02-03 ENCOUNTER — Telehealth (INDEPENDENT_AMBULATORY_CARE_PROVIDER_SITE_OTHER): Payer: Self-pay | Admitting: Family Medicine

## 2022-02-03 ENCOUNTER — Encounter (INDEPENDENT_AMBULATORY_CARE_PROVIDER_SITE_OTHER): Payer: Self-pay

## 2022-02-03 NOTE — Telephone Encounter (Signed)
Dawn [employer] - Prior authorization approved for [employer]. Effective:02/03/2022 - 02/03/2023. Patient sent approval message via mychart.

## 2022-02-04 ENCOUNTER — Other Ambulatory Visit: Payer: Self-pay

## 2022-02-08 ENCOUNTER — Encounter (INDEPENDENT_AMBULATORY_CARE_PROVIDER_SITE_OTHER): Payer: Self-pay | Admitting: Bariatrics

## 2022-02-08 ENCOUNTER — Other Ambulatory Visit: Payer: Self-pay

## 2022-02-08 ENCOUNTER — Ambulatory Visit (INDEPENDENT_AMBULATORY_CARE_PROVIDER_SITE_OTHER): Payer: 59 | Admitting: Bariatrics

## 2022-02-08 VITALS — BP 105/63 | HR 83 | Temp 98.1°F | Ht 65.0 in | Wt 187.0 lb

## 2022-02-08 DIAGNOSIS — E559 Vitamin D deficiency, unspecified: Secondary | ICD-10-CM

## 2022-02-08 DIAGNOSIS — Z6831 Body mass index (BMI) 31.0-31.9, adult: Secondary | ICD-10-CM | POA: Diagnosis not present

## 2022-02-08 DIAGNOSIS — E669 Obesity, unspecified: Secondary | ICD-10-CM | POA: Diagnosis not present

## 2022-02-08 DIAGNOSIS — R632 Polyphagia: Secondary | ICD-10-CM

## 2022-02-08 MED ORDER — WEGOVY 2.4 MG/0.75ML ~~LOC~~ SOAJ
2.4000 mg | SUBCUTANEOUS | 0 refills | Status: DC
  Filled 2022-02-08: qty 3, 28d supply, fill #0

## 2022-02-08 MED ORDER — ERGOCALCIFEROL 1.25 MG (50000 UT) PO CAPS
50000.0000 [IU] | ORAL_CAPSULE | ORAL | 0 refills | Status: DC
  Filled 2022-02-08: qty 12, 84d supply, fill #0

## 2022-02-12 ENCOUNTER — Other Ambulatory Visit: Payer: Self-pay

## 2022-02-17 ENCOUNTER — Encounter (INDEPENDENT_AMBULATORY_CARE_PROVIDER_SITE_OTHER): Payer: Self-pay | Admitting: Bariatrics

## 2022-02-17 ENCOUNTER — Encounter (INDEPENDENT_AMBULATORY_CARE_PROVIDER_SITE_OTHER): Payer: Self-pay

## 2022-02-17 NOTE — Progress Notes (Signed)
Chief Complaint:   OBESITY Kristy Travis is here to discuss her progress with her obesity treatment plan along with follow-up of her obesity related diagnoses. Kristy Travis is on keeping a food journal and adhering to recommended goals of 1200 calories and 80 grams of protein daily and states she is following her eating plan approximately 100% of the time. Kristy Travis states she is walking for 30 minutes 3 times per week.  Today's visit was #: 10 Starting weight: 203 lbs Starting date: 08/05/2021 Today's weight: 187 lbs Today's date: 02/08/2022 Total lbs lost to date: 16 Total lbs lost since last in-office visit: 1  Interim History: Kristy Travis is down 1 lbs since her last visit.   Subjective:   1. Vitamin D deficiency Kristy Travis is taking Vitamin D.  2. Polyphagia Kristy Travis is taking Wegovy.   Assessment/Plan:   1. Vitamin D deficiency Kristy Travis will continue prescription Vitamin D 50,000 IU once weekly, and we will refill for 90 days.  - ergocalciferol (VITAMIN D2) 1.25 MG (50000 UT) capsule; Take 1 capsule (50,000 Units total) by mouth once a week. One capsule once weekly  Dispense: 12 capsule; Refill: 0  2. Polyphagia Kristy Travis agreed to increase Wegovy from 1.7 mg to 2.4 mg once weekly, and we will refill for 1 month.  - Semaglutide-Weight Management (WEGOVY) 2.4 MG/0.75ML SOAJ; Inject 2.4 mg into the skin once a week.  Dispense: 3 mL; Refill: 0  3. Obesity, current BMI 31.1 Kristy Travis is currently in the action stage of change. As such, her goal is to continue with weight loss efforts. She has agreed to keeping a food journal and adhering to recommended goals of 1200 calories and 80 grams of protein daily.   Meal planning and intentional eating were discussed. Increase water to 4-5 bottles per day. Increase protein and weigh meat.  Exercise goals: As is.   Behavioral modification strategies: increasing lean protein intake, decreasing simple carbohydrates, increasing vegetables, increasing water intake,  decreasing eating out, no skipping meals, meal planning and cooking strategies, keeping healthy foods in the home, and planning for success.  Kristy Travis has agreed to follow-up with our clinic in 3 to 4 weeks. She was informed of the importance of frequent follow-up visits to maximize her success with intensive lifestyle modifications for her multiple health conditions.   Objective:   Blood pressure 105/63, pulse 83, temperature 98.1 F (36.7 C), height 5\' 5"  (1.651 m), weight 187 lb (84.8 kg), last menstrual period 06/24/2014, SpO2 97 %. Body mass index is 31.12 kg/m.  General: Cooperative, alert, well developed, in no acute distress. HEENT: Conjunctivae and lids unremarkable. Cardiovascular: Regular rhythm.  Lungs: Normal work of breathing. Neurologic: No focal deficits.   Lab Results  Component Value Date   CREATININE 0.62 09/12/2021   BUN 14 09/12/2021   NA 139 09/12/2021   K 3.6 09/12/2021   CL 104 09/12/2021   CO2 29 09/12/2021   Lab Results  Component Value Date   ALT 12 05/21/2021   AST 16 05/21/2021   ALKPHOS 89 05/21/2021   BILITOT 0.3 05/21/2021   Lab Results  Component Value Date   HGBA1C 5.6 11/09/2021   HGBA1C 6.1 (H) 05/21/2021   HGBA1C 5.8 (H) 01/22/2021   HGBA1C 5.6 01/17/2020   HGBA1C 5.6 01/17/2020   HGBA1C 5.6 (A) 01/17/2020   HGBA1C 5.6 01/17/2020   Lab Results  Component Value Date   INSULIN 20.1 11/09/2021   INSULIN 12.3 08/05/2021   Lab Results  Component Value Date  TSH 3.210 01/22/2021   Lab Results  Component Value Date   CHOL 218 (H) 11/09/2021   HDL 71 11/09/2021   LDLCALC 125 (H) 11/09/2021   TRIG 126 11/09/2021   CHOLHDL 3.3 05/21/2021   Lab Results  Component Value Date   VD25OH 41.6 11/09/2021   VD25OH 38.2 05/21/2021   VD25OH 20.6 (L) 01/22/2021   Lab Results  Component Value Date   WBC 7.5 09/12/2021   HGB 11.9 (L) 09/12/2021   HCT 38.8 09/12/2021   MCV 84.7 09/12/2021   PLT 264 09/12/2021   Lab Results   Component Value Date   IRON 129 02/18/2015   TIBC 390 08/16/2013   FERRITIN 16 02/18/2015   Attestation Statements:   Reviewed by clinician on day of visit: allergies, medications, problem list, medical history, surgical history, family history, social history, and previous encounter notes.   Trude Mcburney, am acting as Energy manager for [employer], DO.  I have reviewed the above documentation for accuracy and completeness, and I agree with the above. Corinna Capra, DO

## 2022-02-23 ENCOUNTER — Ambulatory Visit: Payer: 59 | Admitting: Family Medicine

## 2022-02-28 ENCOUNTER — Ambulatory Visit (INDEPENDENT_AMBULATORY_CARE_PROVIDER_SITE_OTHER): Payer: 59

## 2022-02-28 ENCOUNTER — Ambulatory Visit
Admission: EM | Admit: 2022-02-28 | Discharge: 2022-02-28 | Disposition: A | Discharge status: Home / Self Care | Payer: 59 | Attending: Internal Medicine | Admitting: Internal Medicine

## 2022-02-28 DIAGNOSIS — K5901 Slow transit constipation: Secondary | ICD-10-CM | POA: Diagnosis not present

## 2022-02-28 DIAGNOSIS — K5900 Constipation, unspecified: Secondary | ICD-10-CM | POA: Diagnosis not present

## 2022-02-28 NOTE — ED Triage Notes (Signed)
Pt c/o constipation x1week.  Pt took 2 enemas and a laxative and has not used the restroom.   Pt is currently on Wegovy.

## 2022-02-28 NOTE — Discharge Instructions (Addendum)
Your abdominal xray does not show signs of retained stool or obstruction. Try Ginger capsules 300 mg three times a day which helps with colon motility and see if this helps with your constipation  You may go to the ER to have manual disimpaction which we do not do here.

## 2022-02-28 NOTE — ED Provider Notes (Signed)
[facility]    CSN: [id number] Arrival date & time: 02/28/22  1108      History   Chief Complaint Chief Complaint  Patient presents with   Constipation    HPI Kristy Travis is a 56 y.o. female who presents with hx of constipation x 1 week. She has been on Wegovy x 5 months with only mild constipation, but never like this. She tried 2 enemas and a laxiative and nothing came out. She feels she has stool stuck in her rectum. She denies abdominal pain.     Past Medical History:  Diagnosis Date   Arthritis    Back pain    BACK PAIN, LUMBAR 01/15/2008   Qualifier: Diagnosis of  By: Lodema Hong MD, Margaret     Carpal tunnel syndrome    Chronic fatigue    Constipation    GERD (gastroesophageal reflux disease)    GERD (gastroesophageal reflux disease) 04/06/2017   Glucose intolerance (impaired glucose tolerance) 12/12/2017   Hyperlipidemia    Hypertension    per patient this was prior to weight loss surgery/cn 12/01/17   Joint pain    Kidney stone on right side 12/21/2016   Metabolic syndrome X 09/02/2010   Qualifier: Diagnosis of  By: Lillia Mountain LPN, Brandi     Need for shingles vaccine 12/11/2018   Obesity    Pancreatitis 07/2014   had  gall bladder removed , spent 1 week in hospital   PONV (postoperative nausea and vomiting)    Prediabetes    Primary osteoarthritis of left knee    Seasonal allergies 04/04/2012   Vitamin D deficiency     Patient Active Problem List   Diagnosis Date Noted   Chronic pain of left ankle 11/24/2021   Baker's cyst of knee, left 11/24/2021   Obesity (BMI 30.0-34.9) 11/24/2021   Polyphagia 11/09/2021   Hair loss 11/09/2021   Constipation 08/05/2021   Dizziness 05/24/2021   Polyp of transverse colon    Chronic foot pain, left 01/22/2021   Prediabetes 01/17/2020   Mass of soft tissue of chest 01/17/2020   Primary osteoarthritis of left knee 12/12/2017   GERD (gastroesophageal reflux disease) 04/06/2017   Chronic fatigue  disorder 12/22/2016   Vitamin D deficiency 08/27/2011   Pain in joint, multiple sites 09/02/2010   Hyperlipidemia 02/06/2010    Past Surgical History:  Procedure Laterality Date   ABDOMINAL HYSTERECTOMY     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   CHOLECYSTECTOMY  07/2014   COLONOSCOPY WITH PROPOFOL N/A 05/01/2021   Procedure: COLONOSCOPY WITH BIOPSY;  Surgeon: Midge Minium, MD;  Location: [facility];  Service: Endoscopy;  Laterality: N/A;   cyst removed from left foot and bone removed from left foot  April and June 2010   DILATION AND CURETTAGE OF UTERUS     ESOPHAGOGASTRODUODENOSCOPY N/A 03/03/2016   Procedure: ESOPHAGOGASTRODUODENOSCOPY (EGD);  Surgeon: Malissa Hippo, MD;  Location: [facility];  Service: Endoscopy;  Laterality: N/A;  3:00 - moved to 2:00 - Ann to notify pt   ESOPHAGOGASTRODUODENOSCOPY (EGD) WITH PROPOFOL N/A 05/01/2021   Procedure: ESOPHAGOGASTRODUODENOSCOPY (EGD) WITH PROPOFOL;  Surgeon: Midge Minium, MD;  Location: [facility];  Service: Endoscopy;  Laterality: N/A;   KNEE ARTHROSCOPY Right 12/12/2017   Procedure: ARTHROSCOPY KNEE, MEDIAL CHONDROPLASTY, LOOSE BODY REMOVAL,PARTIAL LATERAL MENISCECTOMY;  Surgeon: Donato Heinz, MD;  Location: [facility];  Service: Orthopedics;  Laterality: Right;   KNEE ARTHROSCOPY Left 06/02/2018   Procedure: ARTHROSCOPY KNEE, MEDIAL CHONDROPLASTY, PARTIAL  MEDIAL MENISCECTOMY;  Surgeon: Donato Heinz, MD;  Location: [facility];  Service: Orthopedics;  Laterality: Left;   laparoscopy to eval dyspepsia  2004   POLYPECTOMY N/A 05/01/2021   Procedure: POLYPECTOMY;  Surgeon: Midge Minium, MD;  Location: [facility];  Service: Endoscopy;  Laterality: N/A;   TOTAL VAGINAL HYSTERECTOMY  06/2014   with BSO    OB History     Gravida  3   Para  2   Term  2   Preterm      AB  1   Living  2      SAB  1   IAB      Ectopic      Multiple      Live Births               Home Medications     Prior to Admission medications   Medication Sig Start Date End Date Taking? Authorizing Provider  Blood Pressure Monitor MISC Use as directed for blood pressure reading 01/07/22  Yes Corinna Capra A, DO  dexlansoprazole (DEXILANT) 60 MG capsule Take 60 mg by mouth daily.   Yes [provider]  diclofenac Sodium (VOLTAREN ARTHRITIS PAIN) 1 % GEL APPLY 2 GRAMS TO THE AFFECTED AREA(S) BY TOPICAL ROUTE 4 TIMES PER DAY 12/29/21  Yes   ergocalciferol (VITAMIN D2) 1.25 MG (50000 UT) capsule Take 1 capsule (50,000 Units total) by mouth once a week. One capsule once weekly 02/08/22  Yes Corinna Capra A, DO  Insulin Pen Needle (UNIFINE PENTIPS) 32G X 4 MM MISC Use 1 needle daily to inject medication 11/30/21  Yes Whitmire, Dawn W, FNP  omeprazole (PRILOSEC) 20 MG capsule Take 1 capsule (20 mg total) by mouth 2 (two) times daily before a meal. 12/25/21  Yes Kerri Perches, MD  Semaglutide-Weight Management (WEGOVY) 2.4 MG/0.75ML SOAJ Inject 2.4 mg into the skin once a week. 02/08/22  Yes Corinna Capra A, DO  traZODone (DESYREL) 50 MG tablet Take 25-50 mg (1/2 to 1 tab) by mouth nightly for sleep. 12/21/21  Yes Whitmire, Thermon Leyland, FNP    Family History Family History  Problem Relation Age of Onset   Thrombosis Mother    Diabetes Mother    High blood pressure Mother    High Cholesterol Mother    Kidney disease Mother    Obesity Mother    Diabetes Father    Pulmonary Hypertension Father    Hyperlipidemia Father    Breast cancer Neg Hx    Bladder Cancer Neg Hx    Kidney cancer Neg Hx     Social History Social History   Tobacco Use   Smoking status: Never   Smokeless tobacco: Never  Vaping Use   Vaping Use: Never used  Substance Use Topics   Alcohol use: Not Currently    Comment: occasionally   Drug use: No     Allergies   Patient has no known allergies.   Review of Systems Review of Systems  Constitutional:  Negative for fever.  Gastrointestinal:  Positive for constipation.  Negative for abdominal pain and rectal pain.     Physical Exam Triage Vital Signs ED Triage Vitals  Enc Vitals Group     BP 02/28/22 1123 130/78     Pulse Rate 02/28/22 1123 77     Resp 02/28/22 1123 18     Temp 02/28/22 1123 98.3 F (36.8 C)     Temp Source 02/28/22 1123 Oral  SpO2 02/28/22 1123 99 %     Weight 02/28/22 1122 189 lb (85.7 kg)     Height 02/28/22 1122 5\' 5"  (1.651 m)     Head Circumference --      Peak Flow --      Pain Score 02/28/22 1122 9     Pain Loc --      Pain Edu? --      Excl. in GC? --    No data found.  Updated Vital Signs BP 130/78 (BP Location: Left Arm)   Pulse 77   Temp 98.3 F (36.8 C) (Oral)   Resp 18   Ht 5\' 5"  (1.651 m)   Wt 189 lb (85.7 kg)   LMP 06/24/2014   SpO2 99%   BMI 31.45 kg/m   Visual Acuity Right Eye Distance:   Left Eye Distance:   Bilateral Distance:    Right Eye Near:   Left Eye Near:    Bilateral Near:     Physical Exam Vitals and nursing note reviewed.  Constitutional:      General: She is not in acute distress.    Appearance: She is not toxic-appearing.  HENT:     Right Ear: External ear normal.     Left Ear: External ear normal.  Eyes:     General: No scleral icterus.    Conjunctiva/sclera: Conjunctivae normal.  Pulmonary:     Effort: Pulmonary effort is normal.  Abdominal:     General: Bowel sounds are normal.     Palpations: Abdomen is soft. There is no mass.     Tenderness: There is no abdominal tenderness. There is no guarding or rebound.  Musculoskeletal:        General: Normal range of motion.     Cervical back: Neck supple.  Skin:    General: Skin is warm and dry.  Neurological:     Mental Status: She is alert and oriented to person, place, and time.     Gait: Gait normal.  Psychiatric:        Mood and Affect: Mood normal.        Behavior: Behavior normal.        Thought Content: Thought content normal.        Judgment: Judgment normal.      UC Treatments / Results   Labs (all labs ordered are listed, but only abnormal results are displayed) Labs Reviewed - No data to display  EKG   Radiology DG Abd 1 View  Result Date: 02/28/2022 CLINICAL DATA:  Constipation for 1 week. EXAM: ABDOMEN - 1 VIEW COMPARISON:  None Available. FINDINGS: The bowel gas pattern is normal. No abnormal stool retention. No concerning calcification or other significant radiographic abnormality are seen. Cholecystectomy clips. IMPRESSION: Normal bowel gas pattern. Electronically Signed   By: 06/26/2014 M.D.   On: 02/28/2022 11:41    Procedures Procedures (including critical care time)  Medications Ordered in UC Medications - No data to display  Initial Impression / Assessment and Plan / UC Course  I have reviewed the triage vital signs and the nursing notes.  Pertinent  imaging results that were available during my care of the patient were reviewed by me and considered in my medical decision making (see chart for details).  Slow transit constipation secondary to [facility].  See instructions.    Final Clinical Impressions(s) / UC Diagnoses   Final diagnoses:  Slow transit constipation     Discharge Instructions  Your abdominal xray does not show signs of retained stool or obstruction. Try Ginger capsules 300 mg three times a day which helps with colon motility and see if this helps with your constipation  You may go to the ER to have manual disimpaction which we do not do here.      ED Prescriptions   None    PDMP not reviewed this encounter.   Garey Ham, [place] 02/28/22 1203

## 2022-03-01 NOTE — Progress Notes (Unsigned)
TeleHealth Visit:  This visit was completed with telemedicine (audio/video) technology. [name] has verbally consented to this TeleHealth visit. The patient is located at home, the provider is located at home. The participants in this visit include the listed provider and patient. The visit was conducted today via MyChart video.  OBESITY Kristy Travis is here to discuss her progress with her obesity treatment plan along with follow-up of her obesity related diagnoses.   Today's visit was # 11 Starting weight: 203 lbs Starting date: 08/05/2021 Weight at last in office visit: 187 lbs on 02/08/22 Total weight loss: 16 lbs at last in office visit on 02/08/22.  Nutrition Plan: keeping a food journal and adhering to recommended goals of 1200 calories and 80 gms protein.  Hunger is {EWCONTROLASSESSMENT:24261}. Cravings are {EWCONTROLASSESSMENT:24261}.  Current exercise: {exercise types:16438}  Interim History: ***  Assessment/Plan:  1. ***  2. ***  3. ***  Obesity: Current BMI *** Kristy Travis {CHL AMB IS/IS NOT:210130109} currently in the action stage of change. As such, her goal is to {MWMwtloss#1:210800005}.  She has agreed to {MWMwtlossportion/plan2:23431}.   Exercise goals: {MWM EXERCISE RECS:23473}  Behavioral modification strategies: {MWMwtlossdietstrategies3:23432}.  Kristy Travis has agreed to follow-up with our clinic in {NUMBER 1-10:22536} weeks.   No orders of the defined types were placed in this encounter.   There are no discontinued medications.   No orders of the defined types were placed in this encounter.     Objective:   VITALS: Per patient if applicable, see vitals. GENERAL: Alert and in no acute distress. CARDIOPULMONARY: No increased WOB. Speaking in clear sentences.  PSYCH: Pleasant and cooperative. Speech normal rate and rhythm. Affect is appropriate. Insight and judgement are appropriate. Attention is focused, linear, and appropriate.  NEURO: Oriented as arrived  to appointment on time with no prompting.   Lab Results  Component Value Date   CREATININE 0.62 09/12/2021   BUN 14 09/12/2021   NA 139 09/12/2021   K 3.6 09/12/2021   CL 104 09/12/2021   CO2 29 09/12/2021   Lab Results  Component Value Date   ALT 12 05/21/2021   AST 16 05/21/2021   ALKPHOS 89 05/21/2021   BILITOT 0.3 05/21/2021   Lab Results  Component Value Date   HGBA1C 5.6 11/09/2021   HGBA1C 6.1 (H) 05/21/2021   HGBA1C 5.8 (H) 01/22/2021   HGBA1C 5.6 01/17/2020   HGBA1C 5.6 01/17/2020   HGBA1C 5.6 (A) 01/17/2020   HGBA1C 5.6 01/17/2020   Lab Results  Component Value Date   INSULIN 20.1 11/09/2021   INSULIN 12.3 08/05/2021   Lab Results  Component Value Date   TSH 3.210 01/22/2021   Lab Results  Component Value Date   CHOL 218 (H) 11/09/2021   HDL 71 11/09/2021   LDLCALC 125 (H) 11/09/2021   TRIG 126 11/09/2021   CHOLHDL 3.3 05/21/2021   Lab Results  Component Value Date   WBC 7.5 09/12/2021   HGB 11.9 (L) 09/12/2021   HCT 38.8 09/12/2021   MCV 84.7 09/12/2021   PLT 264 09/12/2021   Lab Results  Component Value Date   IRON 129 02/18/2015   TIBC 390 08/16/2013   FERRITIN 16 02/18/2015   Lab Results  Component Value Date   VD25OH 41.6 11/09/2021   VD25OH 38.2 05/21/2021   VD25OH 20.6 (L) 01/22/2021    Attestation Statements:   Reviewed by clinician on day of visit: allergies, medications, problem list, medical history, surgical history, family history, social history, and previous encounter notes.  ***(delete if  time-based billing not used) Time spent on visit including the items listed below was *** minutes.  -preparing to see the patient (e.g., review of tests, history, previous notes) -obtaining and/or reviewing separately obtained history -counseling and educating the patient/family/caregiver -documenting clinical information in the electronic or other health record -ordering medications, tests, or procedures -independently  interpreting results and communicating results to the patient/ family/caregiver -referring and communicating with other health care professionals  -care coordination

## 2022-03-02 ENCOUNTER — Telehealth (INDEPENDENT_AMBULATORY_CARE_PROVIDER_SITE_OTHER): Payer: 59 | Admitting: Family Medicine

## 2022-03-02 ENCOUNTER — Encounter (INDEPENDENT_AMBULATORY_CARE_PROVIDER_SITE_OTHER): Payer: Self-pay | Admitting: Family Medicine

## 2022-03-02 ENCOUNTER — Other Ambulatory Visit: Payer: Self-pay

## 2022-03-02 DIAGNOSIS — K5900 Constipation, unspecified: Secondary | ICD-10-CM

## 2022-03-02 DIAGNOSIS — E669 Obesity, unspecified: Secondary | ICD-10-CM | POA: Diagnosis not present

## 2022-03-02 DIAGNOSIS — R632 Polyphagia: Secondary | ICD-10-CM | POA: Diagnosis not present

## 2022-03-02 DIAGNOSIS — Z6831 Body mass index (BMI) 31.0-31.9, adult: Secondary | ICD-10-CM | POA: Diagnosis not present

## 2022-03-02 DIAGNOSIS — Z7985 Long-term (current) use of injectable non-insulin antidiabetic drugs: Secondary | ICD-10-CM | POA: Diagnosis not present

## 2022-03-02 DIAGNOSIS — E559 Vitamin D deficiency, unspecified: Secondary | ICD-10-CM | POA: Diagnosis not present

## 2022-03-02 MED ORDER — ERGOCALCIFEROL 1.25 MG (50000 UT) PO CAPS
50000.0000 [IU] | ORAL_CAPSULE | ORAL | 0 refills | Status: DC
  Filled 2022-03-02: qty 12, 84d supply, fill #0

## 2022-03-02 MED ORDER — WEGOVY 2.4 MG/0.75ML ~~LOC~~ SOAJ
2.4000 mg | SUBCUTANEOUS | 0 refills | Status: DC
  Filled 2022-03-02: qty 3, 28d supply, fill #0

## 2022-03-16 ENCOUNTER — Ambulatory Visit: Payer: 59 | Admitting: Family Medicine

## 2022-03-16 ENCOUNTER — Encounter: Payer: Self-pay | Admitting: Family Medicine

## 2022-03-16 ENCOUNTER — Other Ambulatory Visit: Payer: Self-pay

## 2022-03-16 VITALS — BP 115/78 | HR 84 | Ht 65.0 in | Wt 182.0 lb

## 2022-03-16 DIAGNOSIS — G4700 Insomnia, unspecified: Secondary | ICD-10-CM | POA: Insufficient documentation

## 2022-03-16 DIAGNOSIS — F5104 Psychophysiologic insomnia: Secondary | ICD-10-CM

## 2022-03-16 DIAGNOSIS — N3090 Cystitis, unspecified without hematuria: Secondary | ICD-10-CM

## 2022-03-16 DIAGNOSIS — K219 Gastro-esophageal reflux disease without esophagitis: Secondary | ICD-10-CM

## 2022-03-16 DIAGNOSIS — G8929 Other chronic pain: Secondary | ICD-10-CM | POA: Diagnosis not present

## 2022-03-16 DIAGNOSIS — M545 Low back pain, unspecified: Secondary | ICD-10-CM | POA: Diagnosis not present

## 2022-03-16 DIAGNOSIS — E669 Obesity, unspecified: Secondary | ICD-10-CM

## 2022-03-16 LAB — POCT URINALYSIS DIP (CLINITEK)
Bilirubin, UA: NEGATIVE
Glucose, UA: NEGATIVE mg/dL
Ketones, POC UA: NEGATIVE mg/dL
Leukocytes, UA: NEGATIVE
Nitrite, UA: NEGATIVE
POC PROTEIN,UA: 30 — AB
Spec Grav, UA: >=1.03 — AB (ref 1.010–1.025)
Urobilinogen, UA: 0.2 E.U./dL
pH, UA: 6 (ref 5.0–8.0)

## 2022-03-16 MED ORDER — PREDNISONE 5 MG PO TABS
5.0000 mg | ORAL_TABLET | Freq: Two times a day (BID) | ORAL | 0 refills | Status: AC
  Filled 2022-03-16: qty 10, 5d supply, fill #0

## 2022-03-16 MED ORDER — METHYLPREDNISOLONE ACETATE 80 MG/ML IJ SUSP
80.0000 mg | Freq: Once | INTRAMUSCULAR | Status: AC
  Administered 2022-03-16: 80 mg via INTRAMUSCULAR

## 2022-03-16 MED ORDER — DEXLANSOPRAZOLE 60 MG PO CPDR
60.0000 mg | DELAYED_RELEASE_CAPSULE | Freq: Every day | ORAL | 2 refills | Status: DC
  Filled 2022-03-16: qty 30, 30d supply, fill #0

## 2022-03-16 MED ORDER — SEMAGLUTIDE-WEIGHT MANAGEMENT 2.4 MG/0.75ML ~~LOC~~ SOAJ
2.4000 mg | SUBCUTANEOUS | 3 refills | Status: DC
  Filled 2022-03-16 – 2022-04-02 (×2): qty 3, 28d supply, fill #0
  Filled 2022-04-20: qty 3, 28d supply, fill #1

## 2022-03-16 MED ORDER — DEXLANSOPRAZOLE 60 MG PO CPDR
60.0000 mg | DELAYED_RELEASE_CAPSULE | Freq: Every day | ORAL | 1 refills | Status: DC
Start: 2022-03-16 — End: 2022-06-01
  Filled 2022-03-16 – 2022-05-17 (×4): qty 90, 90d supply, fill #0

## 2022-03-16 NOTE — Progress Notes (Signed)
   Kristy Travis     MRN: [id number]      DOB: 1965/08/14   HPI Kristy Travis is here for follow up and re-evaluation of chronic medical conditions, medication management and review of any available recent lab and radiology data.  Preventive health is updated, specifically  Cancer screening and Immunization.   Questions or concerns regarding consultations or procedures which the PT has had in the interim are  addressed.Wants weight management through Primary care has been doing well on wegovy and tolerates medication well C/opoor sleep, not taking trazodone, made her feel whoozy 2 week h/o low back pain, limiting movement C/o urinary frequency and mild dysuria  ROS Denies recent fever or chills. Denies sinus pressure, nasal congestion, ear pain or sore throat. Denies chest congestion, productive cough or wheezing. Denies chest pains, palpitations and leg swelling Denies abdominal pain, nausea, vomiting,diarrhea or constipation.   . Denies headaches, seizures, numbness, or tingling. Denies depression, anxiety . Denies skin break down or rash.   PE  BP 115/78 (BP Location: Right Arm, Patient Position: Sitting, Cuff Size: Large)   Pulse 84   Ht 5\' 5"  (1.651 m)   Wt 182 lb 0.6 oz (82.6 kg)   LMP 06/24/2014   SpO2 98%   BMI 30.29 kg/m   Patient alert and oriented and in no cardiopulmonary distress.Pt in pain  HEENT: No facial asymmetry, EOMI,     Neck supple .  Chest: Clear to auscultation bilaterally.  CVS: S1, S2 no murmurs, no S3.Regular rate.  ABD: Soft non tender.   Ext: No edema  MS: decreased ROM lumbar spine,tender over SI joint adequate in  shoulders, hips and knees.  Skin: Intact, no ulcerations or rash noted.  Psych: Good eye contact, normal affect. Memory intact not anxious or depressed appearing.  CNS: CN 2-12 intact, power,  normal throughout.no focal deficits noted.   Assessment & Plan  Back pain Current flare, depo medrol I office  followed by 5 day course of prednisone  GERD (gastroesophageal reflux disease) Controlled, no change in medication   Obesity (BMI 30.0-34.9)  Patient re-educated about  the importance of commitment to a  minimum of 150 minutes of exercise per week as able. Semaglutide precribed  The importance of healthy food choices with portion control discussed, as well as eating regularly and within a 12 hour window most days. The need to choose "clean , green" food 50 to 75% of the time is discussed, as well as to make water the primary drink and set a goal of 64 ounces water daily.       03/16/2022   10:43 AM 02/28/2022   11:22 AM 02/08/2022    9:00 AM  Weight /BMI  Weight 182 lb 0.6 oz 189 lb 187 lb  Height 5\' 5"  (1.651 m) 5\' 5"  (1.651 m) 5\' 5"  (1.651 m)  BMI 30.29 kg/m2 31.45 kg/m2 31.12 kg/m2      Cystitis Mildly symptomatic with blood in urine, if no infection, then refer to Urology  Insomnia Sleep hygiene reviewed and written information offered also. Prescription sent for  medication needed. Tylenol pm at bedtime for sleep and joint pain

## 2022-03-16 NOTE — Assessment & Plan Note (Signed)
Mildly symptomatic with blood in urine, if no infection, then refer to Urology

## 2022-03-16 NOTE — Assessment & Plan Note (Signed)
Sleep hygiene reviewed and written information offered also. Prescription sent for  medication needed. Tylenol pm at bedtime for sleep and joint pain

## 2022-03-16 NOTE — Assessment & Plan Note (Signed)
Current flare, depo medrol I office followed by 5 day course of prednisone

## 2022-03-16 NOTE — Assessment & Plan Note (Signed)
Controlled, no change in medication  

## 2022-03-16 NOTE — Assessment & Plan Note (Addendum)
  Patient re-educated about  the importance of commitment to a  minimum of 150 minutes of exercise per week as able. Semaglutide precribed  The importance of healthy food choices with portion control discussed, as well as eating regularly and within a 12 hour window most days. The need to choose "clean , green" food 50 to 75% of the time is discussed, as well as to make water the primary drink and set a goal of 64 ounces water daily.       03/16/2022   10:43 AM 02/28/2022   11:22 AM 02/08/2022    9:00 AM  Weight /BMI  Weight 182 lb 0.6 oz 189 lb 187 lb  Height 5\' 5"  (1.651 m) 5\' 5"  (1.651 m) 5\' 5"  (1.651 m)  BMI 30.29 kg/m2 31.45 kg/m2 31.12 kg/m2

## 2022-03-16 NOTE — Patient Instructions (Addendum)
F/U in 12 weeks, call if you need me sooner  Depo medrol 80 mg IM in office today for back pain, and 5 day course of prednisone is prescribed  Use Tylenol PM at night for arthritic pain and sleep  Congrats on weight loss, 3 month supply of medication is prescribed, KEEP IT UP  URINE HAS BLOOD IN IT AND SUGGESTS THAT YOU ARE NOT DRINKING ENOUGH WATER  IF THE CULTURE SHOWS NO INFECTION, WHICH I THINK IS MOST LIKELY, YOU WILL BE REFERRED TO UROLOGY  NURSE PLS SEND FOR PAP THIS YEAR FROM DR Valentino Saxon   Thanks for choosing [facility], we consider it a privelige to serve you.

## 2022-03-18 LAB — URINE CULTURE

## 2022-03-19 ENCOUNTER — Other Ambulatory Visit: Payer: Self-pay

## 2022-03-22 ENCOUNTER — Telehealth: Payer: Self-pay | Admitting: Family Medicine

## 2022-03-22 ENCOUNTER — Other Ambulatory Visit: Payer: Self-pay | Admitting: Family Medicine

## 2022-03-22 ENCOUNTER — Other Ambulatory Visit: Payer: Self-pay

## 2022-03-22 MED ORDER — AMPICILLIN 500 MG PO CAPS
500.0000 mg | ORAL_CAPSULE | Freq: Three times a day (TID) | ORAL | 0 refills | Status: DC
  Filled 2022-03-22: qty 9, 3d supply, fill #0

## 2022-03-22 NOTE — Telephone Encounter (Signed)
Patient returning call.

## 2022-03-29 ENCOUNTER — Ambulatory Visit (INDEPENDENT_AMBULATORY_CARE_PROVIDER_SITE_OTHER): Payer: 59 | Admitting: Bariatrics

## 2022-03-29 ENCOUNTER — Ambulatory Visit (INDEPENDENT_AMBULATORY_CARE_PROVIDER_SITE_OTHER): Payer: 59 | Admitting: Family Medicine

## 2022-03-31 ENCOUNTER — Other Ambulatory Visit: Payer: Self-pay

## 2022-03-31 ENCOUNTER — Encounter: Payer: Self-pay | Admitting: Family Medicine

## 2022-04-04 ENCOUNTER — Other Ambulatory Visit: Payer: Self-pay

## 2022-04-05 ENCOUNTER — Ambulatory Visit: Payer: 59 | Admitting: Family Medicine

## 2022-04-05 ENCOUNTER — Other Ambulatory Visit: Payer: Self-pay

## 2022-04-05 ENCOUNTER — Other Ambulatory Visit: Payer: Self-pay | Admitting: Family Medicine

## 2022-04-06 ENCOUNTER — Other Ambulatory Visit: Payer: Self-pay

## 2022-04-06 MED FILL — Omeprazole Cap Delayed Release 20 MG: ORAL | 90 days supply | Qty: 180 | Fill #0 | Status: AC

## 2022-04-10 NOTE — Progress Notes (Signed)
No show

## 2022-04-19 ENCOUNTER — Telehealth (INDEPENDENT_AMBULATORY_CARE_PROVIDER_SITE_OTHER): Payer: 59 | Admitting: Family Medicine

## 2022-04-20 ENCOUNTER — Other Ambulatory Visit: Payer: Self-pay

## 2022-04-26 ENCOUNTER — Encounter (INDEPENDENT_AMBULATORY_CARE_PROVIDER_SITE_OTHER): Payer: Self-pay | Admitting: Family Medicine

## 2022-04-26 ENCOUNTER — Telehealth (INDEPENDENT_AMBULATORY_CARE_PROVIDER_SITE_OTHER): Payer: 59 | Admitting: Family Medicine

## 2022-04-26 ENCOUNTER — Other Ambulatory Visit: Payer: Self-pay

## 2022-04-26 DIAGNOSIS — R632 Polyphagia: Secondary | ICD-10-CM | POA: Diagnosis not present

## 2022-04-26 DIAGNOSIS — E559 Vitamin D deficiency, unspecified: Secondary | ICD-10-CM

## 2022-04-26 DIAGNOSIS — E669 Obesity, unspecified: Secondary | ICD-10-CM

## 2022-04-26 DIAGNOSIS — Z6831 Body mass index (BMI) 31.0-31.9, adult: Secondary | ICD-10-CM | POA: Diagnosis not present

## 2022-04-26 MED ORDER — WEGOVY 2.4 MG/0.75ML ~~LOC~~ SOAJ
2.4000 mg | SUBCUTANEOUS | 0 refills | Status: DC
  Filled 2022-04-26 – 2022-05-18 (×2): qty 3, 28d supply, fill #0

## 2022-05-16 NOTE — Progress Notes (Unsigned)
TeleHealth Visit:  Due to the COVID-19 pandemic, this visit was completed with telemedicine (audio/video) technology to reduce patient and provider exposure as well as to preserve personal protective equipment.   Kristy Travis has verbally consented to this TeleHealth visit. The patient is located at home, the provider is located at the [facility] office. The participants in this visit include the listed provider and patient. The visit was conducted today via video.   Chief Complaint: OBESITY Kristy Travis is here to discuss her progress with her obesity treatment plan along with follow-up of her obesity related diagnoses. [name] is on keeping a food journal and adhering to recommended goals of 1200 calories and 80 grams protein and states she is following her eating plan approximately 100% of the time. [name] states she is not currently exercising.  Today's visit was #: 12 Starting weight: 203 lbs Starting date: 08/05/2021  Interim History: This is Kristy Travis's first OV with me. She was last seen by Alvis Lemmings, NP on 03/02/2022. Pt is on Pescatarian plan. She is not journaling intake, noting she doesn't have enough time and is too busy to journal. Pt had  video visit today due to back pain. She works at [employer] in housekeeping. She has 2 protein shakes a day and occasionally some candy.  Subjective:   1. Polyphagia Kristy Travis denies hunger or cravings on 2.4 mg Wegovy weekly. She is tolerating it well and without side effects. Pt thinks she lost weight.  2. Vitamin D deficiency She is currently taking prescription vitamin D 50,000 IU each week. She denies nausea, vomiting or muscle weakness.  Assessment/Plan:  No orders of the defined types were placed in this encounter.   Medications Discontinued During This Encounter  Medication Reason   Semaglutide-Weight Management (WEGOVY) 2.4 MG/0.75ML SOAJ Reorder     Meds ordered this encounter  Medications   Semaglutide-Weight Management  (WEGOVY) 2.4 MG/0.75ML SOAJ    Sig: Inject 2.4 mg into the skin once a week.    Dispense:  3 mL    Refill:  0     1. Polyphagia Intensive lifestyle modifications are the first line treatment for this issue. We discussed several lifestyle modifications today and she will continue to work on diet, exercise and weight loss efforts. Orders and follow up as documented in patient record.  Counseling Polyphagia is excessive hunger. Causes can include: low blood sugars, hypERthyroidism, PMS, lack of sleep, stress, insulin resistance, diabetes, certain medications, and diets that are deficient in protein and fiber.   Refill- Semaglutide-Weight Management (WEGOVY) 2.4 MG/0.75ML SOAJ; Inject 2.4 mg into the skin once a week.  Dispense: 3 mL; Refill: 0  2. Vitamin D deficiency Low Vitamin D level contributes to fatigue and are associated with obesity, breast, and colon cancer. She agrees to continue to take prescription Vitamin D @50 ,000 IU every week and will follow-up for routine testing of Vitamin D, at least 2-3 times per year to avoid over-replacement. No need for refill.  3. Obesity, current BMI 31.1 Kristy Travis is currently in the action stage of change. As such, her goal is to continue with weight loss efforts. She has agreed to follow Pescatarian Plan but keeping a food journal and adhering to recommended goals of 1200 calories and 80+ grams protein.   Exercise goals:  As is- physical at work.  Behavioral modification strategies: decreasing simple carbohydrates and keeping a strict food journal.  Kristy Travis has agreed to follow-up with our clinic in 4 weeks. She was informed of  the importance of frequent follow-up visits to maximize her success with intensive lifestyle modifications for her multiple health conditions.  Objective:   VITALS: Per patient if applicable, see vitals. GENERAL: Alert and in no acute distress. CARDIOPULMONARY: No increased WOB. Speaking in clear sentences.  PSYCH:  Pleasant and cooperative. Speech normal rate and rhythm. Affect is appropriate. Insight and judgement are appropriate. Attention is focused, linear, and appropriate.  NEURO: Oriented as arrived to appointment on time with no prompting.   Lab Results  Component Value Date   CREATININE 0.62 09/12/2021   BUN 14 09/12/2021   NA 139 09/12/2021   K 3.6 09/12/2021   CL 104 09/12/2021   CO2 29 09/12/2021   Lab Results  Component Value Date   ALT 12 05/21/2021   AST 16 05/21/2021   ALKPHOS 89 05/21/2021   BILITOT 0.3 05/21/2021   Lab Results  Component Value Date   HGBA1C 5.6 11/09/2021   HGBA1C 6.1 (H) 05/21/2021   HGBA1C 5.8 (H) 01/22/2021   HGBA1C 5.6 01/17/2020   HGBA1C 5.6 01/17/2020   HGBA1C 5.6 (A) 01/17/2020   HGBA1C 5.6 01/17/2020   Lab Results  Component Value Date   INSULIN 20.1 11/09/2021   INSULIN 12.3 08/05/2021   Lab Results  Component Value Date   TSH 3.210 01/22/2021   Lab Results  Component Value Date   CHOL 218 (H) 11/09/2021   HDL 71 11/09/2021   LDLCALC 125 (H) 11/09/2021   TRIG 126 11/09/2021   CHOLHDL 3.3 05/21/2021   Lab Results  Component Value Date   VD25OH 41.6 11/09/2021   VD25OH 38.2 05/21/2021   VD25OH 20.6 (L) 01/22/2021   Lab Results  Component Value Date   WBC 7.5 09/12/2021   HGB 11.9 (L) 09/12/2021   HCT 38.8 09/12/2021   MCV 84.7 09/12/2021   PLT 264 09/12/2021   Lab Results  Component Value Date   IRON 129 02/18/2015   TIBC 390 08/16/2013   FERRITIN 16 02/18/2015    Attestation Statements:   Reviewed by clinician on day of visit: allergies, medications, problem list, medical history, surgical history, family history, social history, and previous encounter notes.  I, Kathlene November, BS, CMA, am acting as transcriptionist for [employer], DO.  I have reviewed the above documentation for accuracy and completeness, and I agree with the above. Marjory Sneddon, D.O.  The [place] was signed into law  in 2016 which includes the topic of electronic health records.  This provides immediate access to information in MyChart.  This includes consultation notes, operative notes, office notes, lab results and pathology reports.  If you have any questions about what you read please let us know at your next visit so we can discuss your concerns and take corrective action if need be.  We are right here with you.

## 2022-05-17 ENCOUNTER — Other Ambulatory Visit: Payer: Self-pay

## 2022-05-18 ENCOUNTER — Other Ambulatory Visit: Payer: Self-pay

## 2022-05-19 ENCOUNTER — Other Ambulatory Visit: Payer: Self-pay

## 2022-06-01 ENCOUNTER — Other Ambulatory Visit: Payer: Self-pay

## 2022-06-01 ENCOUNTER — Encounter: Payer: Self-pay | Admitting: Family Medicine

## 2022-06-01 ENCOUNTER — Ambulatory Visit: Payer: 59 | Admitting: Family Medicine

## 2022-06-01 VITALS — BP 118/81 | HR 84 | Ht 65.0 in | Wt 167.1 lb

## 2022-06-01 DIAGNOSIS — E559 Vitamin D deficiency, unspecified: Secondary | ICD-10-CM | POA: Diagnosis not present

## 2022-06-01 DIAGNOSIS — R3129 Other microscopic hematuria: Secondary | ICD-10-CM | POA: Diagnosis not present

## 2022-06-01 DIAGNOSIS — G8929 Other chronic pain: Secondary | ICD-10-CM

## 2022-06-01 DIAGNOSIS — M545 Low back pain, unspecified: Secondary | ICD-10-CM

## 2022-06-01 DIAGNOSIS — E7849 Other hyperlipidemia: Secondary | ICD-10-CM

## 2022-06-01 DIAGNOSIS — N3090 Cystitis, unspecified without hematuria: Secondary | ICD-10-CM

## 2022-06-01 DIAGNOSIS — E663 Overweight: Secondary | ICD-10-CM

## 2022-06-01 LAB — POCT URINALYSIS DIP (CLINITEK)
Bilirubin, UA: NEGATIVE
Glucose, UA: NEGATIVE mg/dL
Ketones, POC UA: NEGATIVE mg/dL
Leukocytes, UA: NEGATIVE
Nitrite, UA: NEGATIVE
POC PROTEIN,UA: NEGATIVE
Spec Grav, UA: 1.025 (ref 1.010–1.025)
Urobilinogen, UA: 0.2 E.U./dL
pH, UA: 7 (ref 5.0–8.0)

## 2022-06-01 MED ORDER — HYDROCODONE-ACETAMINOPHEN 5-325 MG PO TABS
ORAL_TABLET | ORAL | 0 refills | Status: DC
  Filled 2022-06-01: qty 10, 10d supply, fill #0

## 2022-06-01 MED ORDER — SEMAGLUTIDE-WEIGHT MANAGEMENT 2.4 MG/0.75ML ~~LOC~~ SOAJ
2.4000 mg | SUBCUTANEOUS | 3 refills | Status: DC
  Filled 2022-06-01: qty 3, 28d supply, fill #0
  Filled 2022-07-13 – 2022-07-14 (×2): qty 3, 28d supply, fill #1
  Filled 2022-08-05: qty 3, 28d supply, fill #2

## 2022-06-01 NOTE — Progress Notes (Signed)
   Kristy Travis     MRN: [id number]      DOB: 08-Jun-1966   HPI Kristy Travis is here for follow up and re-evaluation of chronic medical conditions, medication management and review of any available recent lab and radiology data.  Preventive health is updated, specifically  Cancer screening and Immunization.   C/o chronic localized back pain, disturbs her sleep C/o recurrent urinary symptoms and concerned about microscopic h hematuria Doing very well with wegovy, excellent weight loss, denies uncontrolled constipation , nausea or abdominal pain  ROS Denies recent fever or chills. Denies sinus pressure, nasal congestion, ear pain or sore throat. Denies chest congestion, productive cough or wheezing. Denies chest pains, palpitations and leg swelling Denies abdominal pain, nausea, vomiting,diarrhea or constipation.   Denies headaches, seizures, numbness, or tingling. Denies depression, anxiety or insomnia. Denies skin break down or rash.   PE  BP 118/81 (BP Location: Right Arm, Patient Position: Sitting, Cuff Size: Large)   Pulse 84   Ht 5\' 5"  (1.651 m)   Wt 167 lb 1.3 oz (75.8 kg)   LMP 06/24/2014   SpO2 98%   BMI 27.80 kg/m   Patient alert and oriented and in no cardiopulmonary distress.Looks uncomfortable and in pain  HEENT: No facial asymmetry, EOMI,     Neck supple .  Chest: Clear to auscultation bilaterally.  CVS: S1, S2 no murmurs, no S3.Regular rate.  ABD: Soft non tender. No renal angle tenderness  Ext: No edema  MS: decreased ROM lumbar spine with localized tenderness in lumbar region, normal ROM in shoulders, hips and knees.  Skin: Intact, no ulcerations or rash noted.  Psych: Good eye contact, normal affect. Memory intact not anxious or depressed appearing.  CNS: CN 2-12 intact, power,  normal throughout.no focal deficits noted.   Assessment & Plan  Back pain Chronic localized pain, refer to ortho for eval and management, limited hydrocodone  prescribed  Cystitis Recurrent symptoms with microscopic hematuria, refer to Urology  Overweight (BMI 25.0-29.9) Great response to wegovy, continue same dose  Patient re-educated about  the importance of commitment to a  minimum of 150 minutes of exercise per week as able.  The importance of healthy food choices with portion control discussed, as well as eating regularly and within a 12 hour window most days. The need to choose "clean , green" food 50 to 75% of the time is discussed, as well as to make water the primary drink and set a goal of 64 ounces water daily.       06/01/2022   10:11 AM 03/16/2022   10:43 AM 02/28/2022   11:22 AM  Weight /BMI  Weight 167 lb 1.3 oz 182 lb 0.6 oz 189 lb  Height 5\' 5"  (1.651 m) 5\' 5"  (1.651 m) 5\' 5"  (1.651 m)  BMI 27.8 kg/m2 30.29 kg/m2 31.45 kg/m2

## 2022-06-01 NOTE — Patient Instructions (Addendum)
Follow-up in 12 weeks call if you need me sooner.  Congrats on excellent weight loss keep this up.  Fasting lipid panel Chem-7 and EGFR TSH and vitamin D and cBC today.  Continue Wegovy at current dose.  You are referred to orthopedics regarding chronic back pain.  You are referred to urology regarding recurrent cystitis symptoms with blood in the urine.  Please reconsider your getting the COVID-vaccine this is recommended.  A limited amount of hydrocodone is prescribed for bedtime use only for back pain until orthopedics has evaluated you.  Please use sparingly  Best for the Season and 2024  Thanks for choosing [facility], we consider it a privelige to serve you.

## 2022-06-01 NOTE — Assessment & Plan Note (Signed)
Great response to wegovy, continue same dose  Patient re-educated about  the importance of commitment to a  minimum of 150 minutes of exercise per week as able.  The importance of healthy food choices with portion control discussed, as well as eating regularly and within a 12 hour window most days. The need to choose "clean , green" food 50 to 75% of the time is discussed, as well as to make water the primary drink and set a goal of 64 ounces water daily.       06/01/2022   10:11 AM 03/16/2022   10:43 AM 02/28/2022   11:22 AM  Weight /BMI  Weight 167 lb 1.3 oz 182 lb 0.6 oz 189 lb  Height 5\' 5"  (1.651 m) 5\' 5"  (1.651 m) 5\' 5"  (1.651 m)  BMI 27.8 kg/m2 30.29 kg/m2 31.45 kg/m2

## 2022-06-01 NOTE — Assessment & Plan Note (Signed)
Recurrent symptoms with microscopic hematuria, refer to Urology

## 2022-06-01 NOTE — Assessment & Plan Note (Signed)
Chronic localized pain, refer to ortho for eval and management, limited hydrocodone prescribed

## 2022-06-02 ENCOUNTER — Ambulatory Visit (INDEPENDENT_AMBULATORY_CARE_PROVIDER_SITE_OTHER): Payer: 59

## 2022-06-02 ENCOUNTER — Other Ambulatory Visit: Payer: Self-pay

## 2022-06-02 ENCOUNTER — Ambulatory Visit (INDEPENDENT_AMBULATORY_CARE_PROVIDER_SITE_OTHER): Payer: 59 | Admitting: Orthopedic Surgery

## 2022-06-02 ENCOUNTER — Encounter: Payer: Self-pay | Admitting: Orthopedic Surgery

## 2022-06-02 VITALS — BP 110/76 | HR 80 | Ht 65.0 in | Wt 167.1 lb

## 2022-06-02 DIAGNOSIS — M545 Low back pain, unspecified: Secondary | ICD-10-CM

## 2022-06-02 LAB — CBC WITH DIFFERENTIAL/PLATELET
Basophils Absolute: 0 10*3/uL (ref 0.0–0.2)
Basos: 0 %
EOS (ABSOLUTE): 0.1 10*3/uL (ref 0.0–0.4)
Eos: 1 %
Hematocrit: 37.3 % (ref 34.0–46.6)
Hemoglobin: 11.8 g/dL (ref 11.1–15.9)
Immature Grans (Abs): 0 10*3/uL (ref 0.0–0.1)
Immature Granulocytes: 0 %
Lymphocytes Absolute: 1.4 10*3/uL (ref 0.7–3.1)
Lymphs: 30 %
MCH: 26.6 pg (ref 26.6–33.0)
MCHC: 31.6 g/dL (ref 31.5–35.7)
MCV: 84 fL (ref 79–97)
Monocytes Absolute: 0.3 10*3/uL (ref 0.1–0.9)
Monocytes: 6 %
Neutrophils Absolute: 3 10*3/uL (ref 1.4–7.0)
Neutrophils: 63 %
Platelets: 250 10*3/uL (ref 150–450)
RBC: 4.44 x10E6/uL (ref 3.77–5.28)
RDW: 14.2 % (ref 11.7–15.4)
WBC: 4.8 10*3/uL (ref 3.4–10.8)

## 2022-06-02 LAB — BMP8+EGFR
BUN/Creatinine Ratio: 16 (ref 9–23)
BUN: 11 mg/dL (ref 6–24)
CO2: 23 mmol/L (ref 20–29)
Calcium: 9.8 mg/dL (ref 8.7–10.2)
Chloride: 101 mmol/L (ref 96–106)
Creatinine, Ser: 0.68 mg/dL (ref 0.57–1.00)
Glucose: 79 mg/dL (ref 70–99)
Potassium: 4.5 mmol/L (ref 3.5–5.2)
Sodium: 145 mmol/L — ABNORMAL HIGH (ref 134–144)
eGFR: 103 mL/min/{1.73_m2} (ref >59)

## 2022-06-02 LAB — LIPID PANEL
Chol/HDL Ratio: 2.7 ratio (ref 0.0–4.4)
Cholesterol, Total: 239 mg/dL — ABNORMAL HIGH (ref 100–199)
HDL: 90 mg/dL (ref >39)
LDL Chol Calc (NIH): 137 mg/dL — ABNORMAL HIGH (ref 0–99)
Triglycerides: 72 mg/dL (ref 0–149)
VLDL Cholesterol Cal: 12 mg/dL (ref 5–40)

## 2022-06-02 LAB — VITAMIN D 25 HYDROXY (VIT D DEFICIENCY, FRACTURES): Vit D, 25-Hydroxy: 25.3 ng/mL — ABNORMAL LOW (ref 30.0–100.0)

## 2022-06-02 MED ORDER — ERGOCALCIFEROL 1.25 MG (50000 UT) PO CAPS
50000.0000 [IU] | ORAL_CAPSULE | ORAL | 2 refills | Status: AC
  Filled 2022-06-02: qty 12, 84d supply, fill #0

## 2022-06-02 MED ORDER — CELECOXIB 100 MG PO CAPS
100.0000 mg | ORAL_CAPSULE | Freq: Two times a day (BID) | ORAL | 0 refills | Status: DC
  Filled 2022-06-02: qty 60, 30d supply, fill #0

## 2022-06-02 NOTE — Addendum Note (Signed)
Addended by: Syliva Overman E on: 06/02/2022 12:59 PM   Modules accepted: Orders

## 2022-06-02 NOTE — Progress Notes (Signed)
Orthopedic Spine Surgery Office Note  Assessment: Patient is a 56 y.o. female with chronic low back pain that has gotten worse within the last month.  She has tenderness palpation over the lumbar paraspinal muscles.  No radicular symptoms.   Plan: -Explained that initially conservative treatment is tried as a significant number of patients may experience relief with these treatment modalities. Discussed that the conservative treatments include:  -activity modification  -physical therapy  -over the counter pain medications  -steroid injections  -pain management -Patient has tried activity modification, Tylenol -Recommended physical therapy and core strengthening at home.  A referral was provided for physical therapy.  I also prescribed her Celebrex for additional pain relief. -If she is doing no better at her next visit, I will recommend MRI of the lumbar spine -Patient should return to office in 6 weeks, repeat x-rays of lumbar spine at next visit: None   Patient expressed understanding of the plan and all questions were answered to the patient's satisfaction.   ___________________________________________________________________________   History:  Patient is a 56 y.o. female who presents today for lumbar spine.  Patient has had chronic low back pain but it has gotten acutely worse over the last month.  She states that she feels that with pretty much any activity.  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  She has been less active in the last month because of the pain.  She feels like there is tightness and a knot in her back.  She does not have any symptoms that radiate down the legs.  There is no injury or trauma that brought on the pain.   Weakness: Denies Symptoms of imbalance: Denies Paresthesias and numbness: Denies Bowel or bladder incontinence: Denies Saddle anesthesia: Denies  Treatments tried: Activity modification, Tylenol  Review of  systems: Denies fevers and chills, night sweats, unexplained weight loss, history of cancer.  Has had pain that wakes her at night  Past medical history: Hyperlipidemia GERD HTN Chronic fatigue Vitamin D deficiency  Allergies: NKDA  Past surgical history:  Hysterectomy Cholecystectomy Bariatric surgery Bilateral knee arthroscopy Polypectomy Left knee arthroscopy  Social history: Denies use of nicotine product (smoking, vaping, patches, smokeless) Alcohol use: Denies Denies recreational drug use   Physical Exam:  General: no acute distress, appears stated age Neurologic: alert, answering questions appropriately, following commands Respiratory: unlabored breathing on room air, symmetric chest rise Psychiatric: appropriate affect, normal cadence to speech   MSK (spine):  -Strength exam      Left  Right EHL    5/5  5/5 TA    5/5  5/5 GSC    5/5  5/5 Knee extension  5/5  5/5 Hip flexion   5/5  5/5  -Sensory exam    Sensation intact to light touch in L3-S1 nerve distributions of bilateral lower extremities  -Achilles DTR: 2/4 on the left, 2/4 on the right -Patellar tendon DTR: 2/4 on the left, 2/4 on the right  -Straight leg raise: Negative -Contralateral straight leg raise: Negative -Clonus: no beats bilaterally  -Left hip exam: Positive FADIR, no pain to remainder of range of motion of the hip, negative FABER, negative SI joint compression test, negative stinchfield -Right hip exam: No pain through range of motion, negative Stinchfield, negative FABER, negative SI joint compression test  Imaging: XR of the lumbar spine from 06/02/2022 was independently reviewed and interpreted, showing osteophyte formation in the bilateral hips on the SMM side.  Disc height loss  at L5-S1.  No evidence of instability on flexion-extension views.  No fracture or dislocation.   Patient name: Kristy Travis Patient MRN: [id number] Date of visit: 06/02/22

## 2022-06-04 LAB — URINE CULTURE: Organism ID, Bacteria: NO GROWTH

## 2022-06-08 ENCOUNTER — Other Ambulatory Visit: Payer: Self-pay | Admitting: *Deleted

## 2022-06-08 ENCOUNTER — Other Ambulatory Visit: Payer: Self-pay

## 2022-06-08 DIAGNOSIS — N3090 Cystitis, unspecified without hematuria: Secondary | ICD-10-CM

## 2022-06-09 ENCOUNTER — Ambulatory Visit (INDEPENDENT_AMBULATORY_CARE_PROVIDER_SITE_OTHER): Payer: 59 | Admitting: Urology

## 2022-06-09 ENCOUNTER — Encounter: Payer: Self-pay | Admitting: Urology

## 2022-06-09 ENCOUNTER — Other Ambulatory Visit
Admission: RE | Admit: 2022-06-09 | Discharge: 2022-06-09 | Disposition: A | Discharge status: Home / Self Care | Payer: 59 | Attending: Urology | Admitting: Urology

## 2022-06-09 ENCOUNTER — Other Ambulatory Visit: Payer: Self-pay

## 2022-06-09 VITALS — BP 120/77 | HR 68 | Ht 65.0 in | Wt 167.0 lb

## 2022-06-09 DIAGNOSIS — N952 Postmenopausal atrophic vaginitis: Secondary | ICD-10-CM | POA: Diagnosis not present

## 2022-06-09 DIAGNOSIS — N3090 Cystitis, unspecified without hematuria: Secondary | ICD-10-CM | POA: Diagnosis not present

## 2022-06-09 DIAGNOSIS — N200 Calculus of kidney: Secondary | ICD-10-CM | POA: Diagnosis not present

## 2022-06-09 DIAGNOSIS — N958 Other specified menopausal and perimenopausal disorders: Secondary | ICD-10-CM

## 2022-06-09 LAB — URINALYSIS, COMPLETE (UACMP) WITH MICROSCOPIC
Bilirubin Urine: NEGATIVE
Glucose, UA: NEGATIVE mg/dL
Hgb urine dipstick: NEGATIVE
Ketones, ur: NEGATIVE mg/dL
Nitrite: NEGATIVE
Protein, ur: NEGATIVE mg/dL
Specific Gravity, Urine: 1.015 (ref 1.005–1.030)
Squamous Epithelial / HPF: NONE SEEN (ref 0–5)
pH: 7 (ref 5.0–8.0)

## 2022-06-09 MED ORDER — ESTRADIOL 0.1 MG/GM VA CREA
TOPICAL_CREAM | VAGINAL | 12 refills | Status: DC
  Filled 2022-06-09: qty 42.5, 30d supply, fill #0

## 2022-06-09 NOTE — Patient Instructions (Addendum)
Genitourinary syndrome of menopause (GSM) are symptoms that occur during menopause and lead to complaints of vaginal dryness, loss of lubrication, painful sexual activity, vaginal wall prolapse, bleeding and discharge. Vulval burning, dryness, irritation or itching, and entry dyspareunia with fissuring are some of the vulval symptoms. Urinary symptoms include recurrent urinary tract infections, urgency and urge incontinence, stress incontinence, dysuria, and voiding issues.  The most common treatment is a topical estrogen cream.  This is very safe, and typically patients noticed significant improvement within a few weeks to months.  It is important to use this daily for the first week, then 3 times per week, and apply to the opening of the urethra.

## 2022-06-09 NOTE — Progress Notes (Signed)
06/09/22 9:08 AM   Kristy Travis 1965/08/28 [id number]  CC: Recurrent UTI, dipstick positive hematuria, urinary symptoms, back pain  HPI: 56 year old female referred for the above issues.  She has had intermittent symptoms of dysuria, urgency, frequency, and low back pain.  She has had multiple urine cultures, most of which have been negative, most recently no growth on 11/21, and likely contaminant on 9/5 with beta-hemolytic strep 10-25k and benign urinalysis.  These have shown trace dipstick positive blood, but no microscopic analysis has been performed.  She does report a history of gross hematuria at the time of UTIs in the past.  She does not always improved with antibiotics.  She has a very distant history of a punctate 1 mm nonobstructing lower pole right renal stone originally seen on CT in 2009.  Urinalysis today is pending.  PMH: Past Medical History:  Diagnosis Date   Arthritis    Back pain    BACK PAIN, LUMBAR 01/15/2008   Qualifier: Diagnosis of  By: Lodema Hong MD, Margaret     Carpal tunnel syndrome    Chronic fatigue    Constipation    GERD (gastroesophageal reflux disease)    GERD (gastroesophageal reflux disease) 04/06/2017   Glucose intolerance (impaired glucose tolerance) 12/12/2017   Hyperlipidemia    Hypertension    per patient this was prior to weight loss surgery/cn 12/01/17   Joint pain    Kidney stone on right side 12/21/2016   Metabolic syndrome X 09/02/2010   Qualifier: Diagnosis of  By: Lillia Mountain LPN, Brandi     Need for shingles vaccine 12/11/2018   Obesity    Pancreatitis 07/2014   had  gall bladder removed , spent 1 week in hospital   PONV (postoperative nausea and vomiting)    Prediabetes    Primary osteoarthritis of left knee    Seasonal allergies 04/04/2012   Vitamin D deficiency     Surgical History: Past Surgical History:  Procedure Laterality Date   ABDOMINAL HYSTERECTOMY     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    CHOLECYSTECTOMY  07/2014   COLONOSCOPY WITH PROPOFOL N/A 05/01/2021   Procedure: COLONOSCOPY WITH BIOPSY;  Surgeon: Midge Minium, MD;  Location: [facility];  Service: Endoscopy;  Laterality: N/A;   cyst removed from left foot and bone removed from left foot  April and June 2010   DILATION AND CURETTAGE OF UTERUS     ESOPHAGOGASTRODUODENOSCOPY N/A 03/03/2016   Procedure: ESOPHAGOGASTRODUODENOSCOPY (EGD);  Surgeon: Malissa Hippo, MD;  Location: [facility];  Service: Endoscopy;  Laterality: N/A;  3:00 - moved to 2:00 - Ann to notify pt   ESOPHAGOGASTRODUODENOSCOPY (EGD) WITH PROPOFOL N/A 05/01/2021   Procedure: ESOPHAGOGASTRODUODENOSCOPY (EGD) WITH PROPOFOL;  Surgeon: Midge Minium, MD;  Location: [facility];  Service: Endoscopy;  Laterality: N/A;   KNEE ARTHROSCOPY Right 12/12/2017   Procedure: ARTHROSCOPY KNEE, MEDIAL CHONDROPLASTY, LOOSE BODY REMOVAL,PARTIAL LATERAL MENISCECTOMY;  Surgeon: Donato Heinz, MD;  Location: [facility] OR[facility];  Service: Orthopedics;  Laterality: Right;   KNEE ARTHROSCOPY Left 06/02/2018   Procedure: ARTHROSCOPY KNEE, MEDIAL CHONDROPLASTY, PARTIAL MEDIAL MENISCECTOMY;  Surgeon: Donato Heinz, MD;  Location: [facility];  Service: Orthopedics;  Laterality: Left;   laparoscopy to eval dyspepsia  2004   POLYPECTOMY N/A 05/01/2021   Procedure: POLYPECTOMY;  Surgeon: Midge Minium, MD;  Location: [facility];  Service: Endoscopy;  Laterality: N/A;   TOTAL VAGINAL HYSTERECTOMY  06/2014   with BSO    Family History:  Family History  Problem Relation Age of Onset   Thrombosis Mother    Diabetes Mother    High blood pressure Mother    High Cholesterol Mother    Kidney disease Mother    Obesity Mother    Diabetes Father    Pulmonary Hypertension Father    Hyperlipidemia Father    Breast cancer Neg Hx    Bladder Cancer Neg Hx    Kidney cancer Neg Hx     Social History:  reports that she has never smoked. She has never been exposed to tobacco  smoke. She has never used smokeless tobacco. She reports that she does not currently use alcohol. She reports that she does not use drugs.  Physical Exam: BP 120/77   Pulse 68   Ht 5\' 5"  (1.651 m)   Wt 167 lb (75.8 kg)   LMP 06/24/2014   BMI 27.79 kg/m    Constitutional:  Alert and oriented, No acute distress. Cardiovascular: No clubbing, cyanosis, or edema. Respiratory: Normal respiratory effort, no increased work of breathing. GI: Abdomen is soft, nontender, nondistended, no abdominal masses  Laboratory Data: Reviewed, see HPI  Pertinent Imaging: I have personally viewed and interpreted the KUB and lumbar spine x-ray from August and November 2023, punctate right renal stone appears nonobstructive and stable from 2009.  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  Stable nonobstructing small right renal stone would not be the cause of her symptoms or back pain.  Urinalysis today is pending  We discussed the evaluation and treatment of patients with recurrent UTIs at length.  We specifically discussed the differences between asymptomatic bacteriuria and true urinary tract infection.  We discussed the AUA definition of recurrent UTI of at least 2 culture proven symptomatic acute cystitis episodes in a 66-month period, or 3 within a 1 year period.  We discussed the importance of culture directed antibiotic treatment, and antibiotic stewardship.  First-line therapy includes nitrofurantoin(5 days), Bactrim(3 days), or fosfomycin(3 g single dose).  Possible etiologies of recurrent infection include periurethral tissue atrophy in postmenopausal woman(genitourinary syndrome of menopause), constipation, sexual activity, incomplete emptying, anatomic abnormalities, and even genetic predisposition.  Finally, we discussed the role of perineal hygiene, timed voiding, adequate hydration,  topical vaginal estrogen, cranberry prophylaxis, and low-dose antibiotic prophylaxis.  -Trial of topical estrogen cream and cranberry tablets -Call with urinalysis results, consider CT or ultrasound if microscopic hematuria without evidence of infection -RTC 3 months symptom check   8-month, MD 06/09/2022  [facility] [address] [place], [place] [phone]

## 2022-06-10 ENCOUNTER — Other Ambulatory Visit: Payer: Self-pay

## 2022-06-10 LAB — URINE CULTURE: Culture: <10000 — AB

## 2022-06-11 ENCOUNTER — Other Ambulatory Visit: Payer: Self-pay

## 2022-06-29 ENCOUNTER — Other Ambulatory Visit: Payer: Self-pay

## 2022-06-29 ENCOUNTER — Ambulatory Visit (INDEPENDENT_AMBULATORY_CARE_PROVIDER_SITE_OTHER): Payer: 59 | Admitting: Family Medicine

## 2022-06-29 ENCOUNTER — Encounter: Payer: Self-pay | Admitting: Family Medicine

## 2022-06-29 DIAGNOSIS — J019 Acute sinusitis, unspecified: Secondary | ICD-10-CM | POA: Diagnosis not present

## 2022-06-29 DIAGNOSIS — B9689 Other specified bacterial agents as the cause of diseases classified elsewhere: Secondary | ICD-10-CM | POA: Diagnosis not present

## 2022-06-29 MED ORDER — AMOXICILLIN-POT CLAVULANATE 875-125 MG PO TABS
1.0000 | ORAL_TABLET | Freq: Two times a day (BID) | ORAL | 0 refills | Status: AC
  Filled 2022-06-29: qty 14, 7d supply, fill #0

## 2022-06-29 MED ORDER — GUAIFENESIN 100 MG/5ML PO LIQD
5.0000 mL | ORAL | 0 refills | Status: DC | PRN
  Filled 2022-06-29: qty 120, 4d supply, fill #0

## 2022-06-29 NOTE — Progress Notes (Signed)
Virtual Visit via Telephone Note   This visit type was conducted via telephone. This format is felt to be most appropriate for this patient at this time.  The patient did not have access to video technology/had technical difficulties with video requiring transitioning to audio format only (telephone).  All issues noted in this document were discussed and addressed.  No physical exam could be performed with this format.  Evaluation Performed:  Follow-up visit  Date:  06/29/2022   ID:  Kristy Travis, Kristy Travis 06/18/1966, MRN [id number]  Patient Location: Home Provider Location: Clinic  Participants: Patient Location of Patient: Home Location of Provider: Clinic Consent was obtain for visit to be over via telehealth. I verified that I am speaking with the correct person using two identifiers.  PCP:  Fayrene Helper, MD   Chief Complaint: Sinus infection  History of Present Illness:    Kristy Travis is a 56 y.o. female with complaints of nasal congestion, headaches, facial pressure and pain, ear ache, and a nonproductive cough.  Onset of symptoms on 06/25/2022. She reports one occurrence of a temperature of 102.0 on 06/26/2022.  She denies shortness of breath or muscle aches.  She denies sick contacts.  She reports that she has not been tested for COVID or flu.  The patient does have symptoms concerning for COVID-19 infection (fever, chills, cough, or new shortness of breath).   Past Medical, Surgical, Social History, Allergies, and Medications have been Reviewed.  Past Medical History:  Diagnosis Date   Arthritis    Back pain    BACK PAIN, LUMBAR 01/15/2008   Qualifier: Diagnosis of  By: Moshe Cipro MD, Margaret     Carpal tunnel syndrome    Chronic fatigue    Constipation    GERD (gastroesophageal reflux disease)    GERD (gastroesophageal reflux disease) 04/06/2017   Glucose intolerance (impaired glucose tolerance) 12/12/2017   Hyperlipidemia     Hypertension    per patient this was prior to weight loss surgery/cn 12/01/17   Joint pain    Kidney stone on right side [id number]   Metabolic syndrome [id number]   Qualifier: Diagnosis of  By: Cori Razor LPN, Brandi     Need for shingles vaccine 12/11/2018   Obesity    Pancreatitis 07/2014   had  gall bladder removed , spent 1 week in hospital   PONV (postoperative nausea and vomiting)    Prediabetes    Primary osteoarthritis of left knee    Seasonal allergies 04/04/2012   Vitamin D deficiency    Past Surgical History:  Procedure Laterality Date   ABDOMINAL HYSTERECTOMY     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   CHOLECYSTECTOMY  07/2014   COLONOSCOPY WITH PROPOFOL N/A 05/01/2021   Procedure: COLONOSCOPY WITH BIOPSY;  Surgeon: Lucilla Lame, MD;  Location: [place];  Service: Endoscopy;  Laterality: N/A;   cyst removed from left foot and bone removed from left foot  April and June 2010   DILATION AND CURETTAGE OF UTERUS     ESOPHAGOGASTRODUODENOSCOPY N/A 03/03/2016   Procedure: ESOPHAGOGASTRODUODENOSCOPY (EGD);  Surgeon: Rogene Houston, MD;  [facility];  Service: Endoscopy;  Laterality: N/A;  3:00 - moved to 2:00 - Ann to notify pt   ESOPHAGOGASTRODUODENOSCOPY (EGD) WITH PROPOFOL N/A 05/01/2021   Procedure: ESOPHAGOGASTRODUODENOSCOPY (EGD) WITH PROPOFOL;  Surgeon: Lucilla Lame, MD;  Location: [place];  Service: Endoscopy;  Laterality: N/A;   KNEE ARTHROSCOPY Right 12/12/2017   Procedure:  ARTHROSCOPY KNEE, MEDIAL CHONDROPLASTY, LOOSE BODY REMOVAL,PARTIAL LATERAL MENISCECTOMY;  Surgeon: Donato Heinz, MD;  Location: [facility];  Service: Orthopedics;  Laterality: Right;   KNEE ARTHROSCOPY Left 06/02/2018   Procedure: ARTHROSCOPY KNEE, MEDIAL CHONDROPLASTY, PARTIAL MEDIAL MENISCECTOMY;  Surgeon: Donato Heinz, MD;  Location: [facility];  Service: Orthopedics;  Laterality: Left;   laparoscopy to eval dyspepsia  2004   POLYPECTOMY N/A 05/01/2021    Procedure: POLYPECTOMY;  Surgeon: Midge Minium, MD;  Location: [facility];  Service: Endoscopy;  Laterality: N/A;   TOTAL VAGINAL HYSTERECTOMY  06/2014   with BSO     Current Meds  Medication Sig   amoxicillin-clavulanate (AUGMENTIN) 875-125 MG tablet Take 1 tablet by mouth 2 (two) times daily for 7 days.   celecoxib (CELEBREX) 100 MG capsule Take 100 mg by mouth 2 (two) times daily.   ergocalciferol (VITAMIN D2) 1.25 MG (50000 UT) capsule Take 1 capsule (50,000 Units total) by mouth once a week.   estradiol (ESTRACE) 0.1 MG/GM vaginal cream Estrogen Cream Instruction  Discard applicator  Apply pea sized amount to tip of finger to urethra before bed. Wash hands well after application. Use Monday, Wednesday and Friday   guaiFENesin (ROBITUSSIN) 100 MG/5ML liquid Take 5 mLs by mouth every 4 (four) hours as needed for cough or to loosen phlegm.   HYDROcodone-acetaminophen (NORCO/VICODIN) 5-325 MG tablet Take one tablet by mouth at bedtime as needed for severe, uncontrolled back pain   Insulin Pen Needle (UNIFINE PENTIPS) 32G X 4 MM MISC Use 1 needle daily to inject medication   omeprazole (PRILOSEC) 20 MG capsule Take 1 capsule (20 mg total) by mouth 2 (two) times daily before a meal.   Semaglutide-Weight Management 2.4 MG/0.75ML SOAJ Inject 2.4 mg into the skin once a week.     Allergies:   Patient has no known allergies.   ROS:   Please see the history of present illness.     All other systems reviewed and are negative.   Labs/Other Tests and Data Reviewed:    Recent Labs: 06/01/2022: BUN 11; Creatinine, Ser 0.68; Hemoglobin 11.8; Platelets 250; Potassium 4.5; Sodium 145   Recent Lipid Panel Lab Results  Component Value Date/Time   CHOL 239 (H) 06/01/2022 11:22 AM   CHOL 190 08/03/2014 04:18 AM   TRIG 72 06/01/2022 11:22 AM   TRIG 94 08/03/2014 04:18 AM   HDL 90 06/01/2022 11:22 AM   HDL 51 08/03/2014 04:18 AM   CHOLHDL 2.7 06/01/2022 11:22 AM   CHOLHDL 3.2  01/17/2020 10:56 AM   LDLCALC 137 (H) 06/01/2022 11:22 AM   LDLCALC 147 (H) 01/17/2020 10:56 AM   LDLCALC 120 (H) 08/03/2014 04:18 AM    Wt Readings from Last 3 Encounters:  06/09/22 167 lb (75.8 kg)  06/02/22 167 lb 1.6 oz (75.8 kg)  06/01/22 167 lb 1.3 oz (75.8 kg)     Objective:    Vital Signs:  LMP 06/24/2014      ASSESSMENT & PLAN:   Acute Rhinosinusitis Will treat with Augmentin for 7 days Robitussin order for cough Encourage  the patient to stay well-hydrated Will also test for COVID and flu   Today, I have spent 12 minutes reviewing the chart, including problem list, medications, and with the patient with telehealth technology discussing the above problems.   Medication Adjustments/Labs and Tests Ordered: Current medicines are reviewed at length with the patient today.  Concerns regarding medicines are outlined above.   Tests Ordered: No orders of the defined  types were placed in this encounter.   Medication Changes: Meds ordered this encounter  Medications   amoxicillin-clavulanate (AUGMENTIN) 875-125 MG tablet    Sig: Take 1 tablet by mouth 2 (two) times daily for 7 days.    Dispense:  14 tablet    Refill:  0   guaiFENesin (ROBITUSSIN) 100 MG/5ML liquid    Sig: Take 5 mLs by mouth every 4 (four) hours as needed for cough or to loosen phlegm.    Dispense:  120 mL    Refill:  0     Note: This dictation was prepared with Dragon dictation along with smaller phrase technology. Similar sounding words can be transcribed inadequately or may not be corrected upon review. Any transcriptional errors that result from this process are unintentional.      Disposition:  Follow up  Signed, Alvira Monday, FNP  06/29/2022 8:19 PM     [facility]

## 2022-07-01 ENCOUNTER — Other Ambulatory Visit: Payer: Self-pay | Admitting: Family Medicine

## 2022-07-01 DIAGNOSIS — U071 COVID-19: Secondary | ICD-10-CM

## 2022-07-01 LAB — NOVEL CORONAVIRUS, NAA: SARS-CoV-2, NAA: DETECTED — AB

## 2022-07-01 MED ORDER — NIRMATRELVIR/RITONAVIR (PAXLOVID)TABLET
3.0000 | ORAL_TABLET | Freq: Two times a day (BID) | ORAL | 0 refills | Status: AC
  Filled 2022-07-01: qty 30, 5d supply, fill #0

## 2022-07-01 NOTE — Progress Notes (Signed)
Please inform the patient that she tested positive for COVID.  A prescription for Paxlovid has been sent to her pharmacy.

## 2022-07-02 ENCOUNTER — Other Ambulatory Visit: Payer: Self-pay

## 2022-07-13 ENCOUNTER — Other Ambulatory Visit: Payer: Self-pay

## 2022-07-14 ENCOUNTER — Other Ambulatory Visit: Payer: Self-pay

## 2022-07-20 ENCOUNTER — Encounter: Payer: 59 | Admitting: Family Medicine

## 2022-07-27 ENCOUNTER — Ambulatory Visit: Payer: Commercial Managed Care - PPO | Admitting: Obstetrics and Gynecology

## 2022-07-30 NOTE — Progress Notes (Signed)
    GYNECOLOGY PROGRESS NOTE  Subjective:    Patient ID: Kristy Travis, female    DOB: 01/13/1966, 57 y.o.   MRN: [id number]  HPI  Patient is a 57 y.o. G74P2012 female who presents for evaluation for pain with intercourse. She reports today that sex is uncomfortable for her. She had been diagnosed with vaginal atrophy in the past, tried Estrace for a short time but noted concerns with use of the medication and hormones.  Was then started on natural lubricants and vaginal moisturizers.  States that this worked for a while (~ 1 year or so), however this is no longer working.  Pain is only with intercourse and it resolves afterwards.   She also still notes vasomotor symptoms. Has tried several hormonal as well as non-hormonal medications in the past, however none relieved her hot flushes, and she was concerned about use of hormones and risk of cancer so discontinued each after only a few months of use. The non-hormonal option (Brisdelle) did not help alleviate her symptoms either.    The following portions of the patient's history were reviewed and updated as appropriate: allergies, current medications, past family history, past medical history, past social history, past surgical history, and problem list.  Review of Systems Pertinent items noted in HPI and remainder of comprehensive ROS otherwise negative.   Objective:  Blood pressure (!) 110/55, pulse 82, resp. rate 16, height 5\' 5"  (1.651 m), weight 162 lb 9.6 oz (73.8 kg), last menstrual period 06/24/2014.  Body mass index is 27.06 kg/m.  General appearance: alert, cooperative, and no distress Abdomen: soft, non-tender; bowel sounds normal; no masses,  no organomegaly Pelvic: external genitalia normal, rectovaginal septum normal.  Vagina without discharge. Moderate vaginal atrophy present with stippling, tenderness with speculum exam. Uterus and cervix surgically absent. Bimanual exam not performed.    Extremities: extremities  normal, atraumatic, no cyanosis or edema Neurologic: Grossly normal   Assessment:   1. Dyspareunia due to medical condition in female   2. Menopausal vasomotor syndrome   3. Vaginal atrophy      Plan:   Discussed options again with patient. Would strongly encourage another trial of local estrogen therapy. Given reassurance on limited systemic spread and low risk of cancer with local use. Patient ok to try. Will give samples of Premarin cream and send prescription.  Discussed new option for treatment with Veozah, new FSA approved medication for treatment of hot flushes.  Patient without any significant liver conditions, has had labs within the past year.  Given sample for trial for 1 week, can prescribe if patient desires.  3. To f/u in 4-6 weeks to reassess symptoms.   Rubie Maid, MD [place]

## 2022-08-03 ENCOUNTER — Encounter: Payer: Self-pay | Admitting: Obstetrics and Gynecology

## 2022-08-03 ENCOUNTER — Ambulatory Visit: Payer: Commercial Managed Care - PPO | Admitting: Obstetrics and Gynecology

## 2022-08-03 ENCOUNTER — Other Ambulatory Visit: Payer: Self-pay

## 2022-08-03 VITALS — BP 110/55 | HR 82 | Resp 16 | Ht 65.0 in | Wt 162.6 lb

## 2022-08-03 DIAGNOSIS — N951 Menopausal and female climacteric states: Secondary | ICD-10-CM

## 2022-08-03 DIAGNOSIS — N9419 Other specified dyspareunia: Secondary | ICD-10-CM | POA: Diagnosis not present

## 2022-08-03 DIAGNOSIS — N952 Postmenopausal atrophic vaginitis: Secondary | ICD-10-CM | POA: Diagnosis not present

## 2022-08-03 MED ORDER — PREMARIN 0.625 MG/GM VA CREA
TOPICAL_CREAM | VAGINAL | 6 refills | Status: DC
  Filled 2022-08-03: qty 30, 15d supply, fill #0

## 2022-08-05 ENCOUNTER — Ambulatory Visit (INDEPENDENT_AMBULATORY_CARE_PROVIDER_SITE_OTHER): Payer: Commercial Managed Care - PPO | Admitting: Family Medicine

## 2022-08-05 ENCOUNTER — Encounter: Payer: Self-pay | Admitting: Family Medicine

## 2022-08-05 ENCOUNTER — Other Ambulatory Visit: Payer: Self-pay

## 2022-08-05 VITALS — BP 119/70 | HR 89 | Ht 65.0 in | Wt 160.0 lb

## 2022-08-05 DIAGNOSIS — E7849 Other hyperlipidemia: Secondary | ICD-10-CM | POA: Diagnosis not present

## 2022-08-05 DIAGNOSIS — Z1239 Encounter for other screening for malignant neoplasm of breast: Secondary | ICD-10-CM | POA: Diagnosis not present

## 2022-08-05 DIAGNOSIS — E663 Overweight: Secondary | ICD-10-CM

## 2022-08-05 DIAGNOSIS — E559 Vitamin D deficiency, unspecified: Secondary | ICD-10-CM | POA: Diagnosis not present

## 2022-08-05 DIAGNOSIS — Z0000 Encounter for general adult medical examination without abnormal findings: Secondary | ICD-10-CM

## 2022-08-05 MED ORDER — SEMAGLUTIDE-WEIGHT MANAGEMENT 1.7 MG/0.75ML ~~LOC~~ SOAJ
1.7000 mg | SUBCUTANEOUS | 3 refills | Status: DC
  Filled 2022-08-05: qty 3, 28d supply, fill #0
  Filled 2022-09-02: qty 3, 28d supply, fill #1
  Filled 2022-09-27: qty 3, 28d supply, fill #2
  Filled 2022-10-27: qty 3, 28d supply, fill #3

## 2022-08-05 NOTE — Assessment & Plan Note (Addendum)
Annual exam as documented. Counseling done  re healthy lifestyle involving commitment to 150 minutes exercise per week, heart healthy diet, and attaining healthy weight.The importance of adequate sleep also discussed. Changes in health habits are decided on by the patient with goals and time frames  set for achieving them. Immunization and cancer screening needs are specifically addressed at this visit. 

## 2022-08-05 NOTE — Assessment & Plan Note (Signed)
Reduce wegovy dose, nearly at goal weight , 10 mins time spent discussing weight management and need to d/c medication

## 2022-08-05 NOTE — Patient Instructions (Addendum)
Follow-up in 3 months, call if you need me sooner.  Please schedule mammogram at checkout.  Dose reduction ends in Wegovy to 1.7 mg weekly.  Continue to follow healthy diet so that you will continue to lose weight gradually, you are almost at a goal weight of 160 pounds.  This may take you in the next 6 to 12 months to get off so be patient continue to be diligent.  Fasting CBC lipid CMP and EGFR , TSH and vitamin D level to be drawn 3 to 5 days before next visit at nearest [place] station.  It is important that you exercise regularly at least 30 minutes 5 times a week. If you develop chest pain, have severe difficulty breathing, or feel very tired, stop exercising immediately and seek medical attention   Thanks for choosing [facility], we consider it a privelige to serve you.

## 2022-08-05 NOTE — Assessment & Plan Note (Signed)
Hyperlipidemia:Low fat diet discussed and encouraged.   Lipid Panel  Lab Results  Component Value Date   CHOL 239 (H) 06/01/2022   HDL 90 06/01/2022   LDLCALC 137 (H) 06/01/2022   TRIG 72 06/01/2022   CHOLHDL 2.7 06/01/2022     Updated lab needed at/ before next visit.

## 2022-08-05 NOTE — Progress Notes (Signed)
    Kristy Travis     MRN: [id number]      DOB: Aug 18, 1965  HPI: Patient is in for annual physical exam.  Recent labs,  are reviewed. Immunization is reviewed , and  is up to date.   PE: BP 119/70 (BP Location: Right Arm, Patient Position: Sitting, Cuff Size: Normal)   Pulse 89   Ht 5\' 5"  (1.651 m)   Wt 160 lb 0.6 oz (72.6 kg)   LMP 06/24/2014   SpO2 98%   BMI 26.63 kg/m   Pleasant  female, alert and oriented x 3, in no cardio-pulmonary distress. Afebrile. HEENT No facial trauma or asymetry. Sinuses non tender.  Extra occullar muscles intact.. External ears normal, . Neck: supple, no adenopathy,JVD or thyromegaly.No bruits.  Chest: Clear to ascultation bilaterally.No crackles or wheezes. Non tender to palpation  Cardiovascular system; Heart sounds normal,  S1 and  S2 ,no S3.  No murmur, or thrill. Apical beat not displaced Peripheral pulses normal.  Abdomen: Soft, non tender, no organomegaly or masses. No bruits. Bowel sounds normal. No guarding, tenderness or rebound.    Musculoskeletal exam: Decreased ROM of lumbar spine, adequate In hips , shoulders and knees. No deformity ,swelling or crepitus noted. No muscle wasting or atrophy.   Neurologic: Cranial nerves 2 to 12 intact. Power, tone ,sensation normal throughout. No disturbance in gait. No tremor.  Skin: Intact, no ulceration, erythema , scaling or rash noted. Pigmentation normal throughout  Psych; Normal mood and affect. Judgement and concentration normal   Assessment & Plan:  Annual physical exam Annual exam as documented. Counseling done  re healthy lifestyle involving commitment to 150 minutes exercise per week, heart healthy diet, and attaining healthy weight.The importance of adequate sleep also discussed. Changes in health habits are decided on by the patient with goals and time frames  set for achieving them. Immunization and cancer screening needs are specifically addressed  at this visit.   Overweight (BMI 25.0-29.9) Reduce wegovy dose, nearly at goal weight , 10 mins time spent discussing weight management and need to d/c medication  Vitamin D deficiency Updated lab needed at/ before next visit.   Hyperlipidemia Hyperlipidemia:Low fat diet discussed and encouraged.   Lipid Panel  Lab Results  Component Value Date   CHOL 239 (H) 06/01/2022   HDL 90 06/01/2022   LDLCALC 137 (H) 06/01/2022   TRIG 72 06/01/2022   CHOLHDL 2.7 06/01/2022     Updated lab needed at/ before next visit.

## 2022-08-05 NOTE — Assessment & Plan Note (Signed)
Updated lab needed at/ before next visit.   

## 2022-08-09 ENCOUNTER — Other Ambulatory Visit: Payer: Self-pay

## 2022-08-09 ENCOUNTER — Other Ambulatory Visit (HOSPITAL_COMMUNITY): Payer: Self-pay

## 2022-08-24 ENCOUNTER — Ambulatory Visit: Payer: Self-pay | Admitting: Family Medicine

## 2022-08-25 ENCOUNTER — Ambulatory Visit: Payer: Self-pay | Admitting: Family Medicine

## 2022-09-07 ENCOUNTER — Encounter: Payer: Self-pay | Admitting: Obstetrics and Gynecology

## 2022-09-07 ENCOUNTER — Ambulatory Visit (INDEPENDENT_AMBULATORY_CARE_PROVIDER_SITE_OTHER): Payer: Commercial Managed Care - PPO | Admitting: Obstetrics and Gynecology

## 2022-09-07 VITALS — BP 111/56 | HR 77 | Resp 16 | Ht 65.0 in | Wt 157.8 lb

## 2022-09-07 DIAGNOSIS — N951 Menopausal and female climacteric states: Secondary | ICD-10-CM | POA: Diagnosis not present

## 2022-09-07 DIAGNOSIS — N952 Postmenopausal atrophic vaginitis: Secondary | ICD-10-CM | POA: Diagnosis not present

## 2022-09-07 MED ORDER — ESTRADIOL 0.5 MG/0.5GM TD GEL: TRANSDERMAL | 0 refills | Status: DC

## 2022-09-07 MED ORDER — ESTRADIOL 1 MG/GM TD GEL: TRANSDERMAL | 0 refills | Status: DC

## 2022-09-07 NOTE — Progress Notes (Signed)
    GYNECOLOGY PROGRESS NOTE  Subjective:    Patient ID: Kristy Travis, female    DOB: 10/07/1965, 57 y.o.   MRN: [id number]  HPI  Patient is a 57 y.o. G42P2012 female who presents for follow up on menopausal vasomotor syndrome and vaginal atrophy. She reports that she continues to use the Premarin cream as prescribed. She stopped taking the Veozah samples as it was causing her abdominal pain. She continues to have some menopausal symptoms. She started using [place] cream to help balance her hormones.  The following portions of the patient's history were reviewed and updated as appropriate: allergies, current medications, past family history, past medical history, past social history, past surgical history, and problem list.  Review of Systems Pertinent items noted in HPI and remainder of comprehensive ROS otherwise negative.   Objective:   Blood pressure (!) 111/56, pulse 77, resp. rate 16, height 5' 5"$  (1.651 m), weight 157 lb 12.8 oz (71.6 kg), last menstrual period 06/24/2014.  Body mass index is 26.26 kg/m. General appearance: alert, cooperative, and no distress Remainder of exam deferred.    Assessment:   1. Menopausal vasomotor syndrome   2. Vaginal atrophy      Plan:   - Discussed alternative herbal/OTC remedies that patient could utilize. Also offered trial of Divigel as patient seems to have issues with oral compounds due to her h/o gastric sleeve.  Patient ok to trial. Given samples of 0.5 mg and 1 mg dosing. To notify via Mychart if prescription desired.    Rubie Maid, MD [place]

## 2022-09-14 ENCOUNTER — Ambulatory Visit: Payer: Managed Care, Other (non HMO) | Admitting: Urology

## 2022-09-28 ENCOUNTER — Other Ambulatory Visit: Payer: Self-pay

## 2022-09-28 DIAGNOSIS — E669 Obesity, unspecified: Secondary | ICD-10-CM | POA: Diagnosis not present

## 2022-09-28 DIAGNOSIS — E7849 Other hyperlipidemia: Secondary | ICD-10-CM | POA: Diagnosis not present

## 2022-09-28 DIAGNOSIS — E559 Vitamin D deficiency, unspecified: Secondary | ICD-10-CM | POA: Diagnosis not present

## 2022-09-28 DIAGNOSIS — Z6833 Body mass index (BMI) 33.0-33.9, adult: Secondary | ICD-10-CM | POA: Diagnosis not present

## 2022-09-29 LAB — CMP14+EGFR
ALT: 11 IU/L (ref 0–32)
AST: 18 IU/L (ref 0–40)
Albumin/Globulin Ratio: 1.8 (ref 1.2–2.2)
Albumin: 4.2 g/dL (ref 3.8–4.9)
Alkaline Phosphatase: 76 IU/L (ref 44–121)
BUN/Creatinine Ratio: 24 — ABNORMAL HIGH (ref 9–23)
BUN: 16 mg/dL (ref 6–24)
Bilirubin Total: 0.3 mg/dL (ref 0.0–1.2)
CO2: 23 mmol/L (ref 20–29)
Calcium: 10 mg/dL (ref 8.7–10.2)
Chloride: 104 mmol/L (ref 96–106)
Creatinine, Ser: 0.67 mg/dL (ref 0.57–1.00)
Globulin, Total: 2.4 g/dL (ref 1.5–4.5)
Glucose: 79 mg/dL (ref 70–99)
Potassium: 4.2 mmol/L (ref 3.5–5.2)
Sodium: 143 mmol/L (ref 134–144)
Total Protein: 6.6 g/dL (ref 6.0–8.5)
eGFR: 104 mL/min/{1.73_m2} (ref >59)

## 2022-09-29 LAB — CBC
Hematocrit: 36 % (ref 34.0–46.6)
Hemoglobin: 11.4 g/dL (ref 11.1–15.9)
MCH: 26 pg — ABNORMAL LOW (ref 26.6–33.0)
MCHC: 31.7 g/dL (ref 31.5–35.7)
MCV: 82 fL (ref 79–97)
Platelets: 243 10*3/uL (ref 150–450)
RBC: 4.39 x10E6/uL (ref 3.77–5.28)
RDW: 13.2 % (ref 11.7–15.4)
WBC: 5 10*3/uL (ref 3.4–10.8)

## 2022-09-29 LAB — LIPID PANEL
Chol/HDL Ratio: 2.7 ratio (ref 0.0–4.4)
Cholesterol, Total: 216 mg/dL — ABNORMAL HIGH (ref 100–199)
HDL: 80 mg/dL (ref >39)
LDL Chol Calc (NIH): 125 mg/dL — ABNORMAL HIGH (ref 0–99)
Triglycerides: 63 mg/dL (ref 0–149)
VLDL Cholesterol Cal: 11 mg/dL (ref 5–40)

## 2022-09-29 LAB — VITAMIN D 25 HYDROXY (VIT D DEFICIENCY, FRACTURES): Vit D, 25-Hydroxy: 55 ng/mL (ref 30.0–100.0)

## 2022-09-29 LAB — TSH: TSH: 1.81 u[IU]/mL (ref 0.450–4.500)

## 2022-11-01 ENCOUNTER — Ambulatory Visit
Admission: RE | Admit: 2022-11-01 | Discharge: 2022-11-01 | Disposition: A | Discharge status: Home / Self Care | Payer: Commercial Managed Care - PPO | Source: Ambulatory Visit | Attending: Family Medicine | Admitting: Family Medicine

## 2022-11-01 DIAGNOSIS — Z1231 Encounter for screening mammogram for malignant neoplasm of breast: Secondary | ICD-10-CM | POA: Diagnosis not present

## 2022-11-01 DIAGNOSIS — Z1239 Encounter for other screening for malignant neoplasm of breast: Secondary | ICD-10-CM | POA: Diagnosis not present

## 2022-11-04 ENCOUNTER — Other Ambulatory Visit: Payer: Self-pay

## 2022-11-05 ENCOUNTER — Other Ambulatory Visit: Payer: Self-pay

## 2022-11-05 ENCOUNTER — Other Ambulatory Visit (HOSPITAL_COMMUNITY): Payer: Self-pay

## 2022-11-05 ENCOUNTER — Encounter: Payer: Self-pay | Admitting: Family Medicine

## 2022-11-05 ENCOUNTER — Ambulatory Visit (INDEPENDENT_AMBULATORY_CARE_PROVIDER_SITE_OTHER): Payer: Commercial Managed Care - PPO | Admitting: Family Medicine

## 2022-11-05 VITALS — BP 111/75 | HR 82 | Ht 65.0 in | Wt 151.0 lb

## 2022-11-05 DIAGNOSIS — E7849 Other hyperlipidemia: Secondary | ICD-10-CM

## 2022-11-05 DIAGNOSIS — E559 Vitamin D deficiency, unspecified: Secondary | ICD-10-CM | POA: Diagnosis not present

## 2022-11-05 DIAGNOSIS — E663 Overweight: Secondary | ICD-10-CM

## 2022-11-05 DIAGNOSIS — F5104 Psychophysiologic insomnia: Secondary | ICD-10-CM

## 2022-11-05 DIAGNOSIS — G9332 Myalgic encephalomyelitis/chronic fatigue syndrome: Secondary | ICD-10-CM | POA: Diagnosis not present

## 2022-11-05 DIAGNOSIS — M545 Low back pain, unspecified: Secondary | ICD-10-CM

## 2022-11-05 DIAGNOSIS — G8929 Other chronic pain: Secondary | ICD-10-CM

## 2022-11-05 DIAGNOSIS — K219 Gastro-esophageal reflux disease without esophagitis: Secondary | ICD-10-CM

## 2022-11-05 MED ORDER — CYCLOBENZAPRINE HCL 5 MG PO TABS
ORAL_TABLET | ORAL | 1 refills | Status: DC
  Filled 2022-11-05: qty 45, 45d supply, fill #0

## 2022-11-05 MED ORDER — SEMAGLUTIDE-WEIGHT MANAGEMENT 1.7 MG/0.75ML ~~LOC~~ SOAJ
1.7000 mg | SUBCUTANEOUS | 0 refills | Status: DC
  Filled 2022-11-05: qty 9, 84d supply, fill #0

## 2022-11-05 NOTE — Progress Notes (Unsigned)
   Kristy Travis     MRN: [id number]      DOB: [birth year]   HPI Kristy Travis is here for follow up and re-evaluation of chronic medical conditions, medication management and review of any available recent lab and radiology data.  Preventive health is updated, specifically  Cancer screening and Immunization.   Questions or concerns regarding consultations or procedures which the PT has had in the interim are  addressed. The PT denies any adverse reactions to current medications since the last visit.  There are no new concerns.  There are no specific complaints   ROS Denies recent fever or chills. Denies sinus pressure, nasal congestion, ear pain or sore throat. Denies chest congestion, productive cough or wheezing. Denies chest pains, palpitations and leg swelling Denies abdominal pain, nausea, vomiting,diarrhea or constipation.   Denies dysuria, frequency, hesitancy or incontinence. Denies joint pain, swelling and limitation in mobility. Denies headaches, seizures, numbness, or tingling. Denies depression, anxiety or insomnia. Denies skin break down or rash.   PE    Patient alert and oriented and in no cardiopulmonary distress.  HEENT: No facial asymmetry, EOMI,     Neck supple .  Chest: Clear to auscultation bilaterally.  CVS: S1, S2 no murmurs, no S3.Regular rate.  ABD: Soft non tender.   Ext: No edema  MS: Adequate ROM spine, shoulders, hips and knees.  Skin: Intact, no ulcerations or rash noted.  Psych: Good eye contact, normal affect. Memory intact not anxious or depressed appearing.  CNS: CN 2-12 intact, power,  normal throughout.no focal deficits noted.   Assessment & Plan  No problem-specific Assessment & Plan notes found for this encounter.

## 2022-11-05 NOTE — Patient Instructions (Signed)
F/U in end October , call if you need me sooner  CONGRATS on excellent weight loss and good health habits' New for back pain and spasm is bedtime muscle relaxer as needed  Target weight is 145 pounds, No weight loss medication needed once you get to that weight  Cholesterol has improved, continue low fat diet  Vit D at goal , stay on weekly vit D  It is important that you exercise regularly at least 30 minutes 5 times a week. If you develop chest pain, have severe difficulty breathing, or feel very tired, stop exercising immediately and seek medical attention   Thanks for choosing [facility], we consider it a privelige to serve you.

## 2022-11-06 NOTE — Assessment & Plan Note (Signed)
Controlled, no change in medication  

## 2022-11-06 NOTE — Assessment & Plan Note (Signed)
Hyperlipidemia:Low fat diet discussed and encouraged.   Lipid Panel  Lab Results  Component Value Date   CHOL 216 (H) 09/28/2022   HDL 80 09/28/2022   LDLCALC 125 (H) 09/28/2022   TRIG 63 09/28/2022   CHOLHDL 2.7 09/28/2022     Improving, continue low fat diet

## 2022-11-06 NOTE — Assessment & Plan Note (Signed)
Unchanged , celebrex was prescribed, but not taking due to gastric bypass surgery Flexeril 5 mg as needed prescribed

## 2022-11-06 NOTE — Assessment & Plan Note (Signed)
  Patient re-educated about  the importance of commitment to a  minimum of 150 minutes of exercise per week as able.  The importance of healthy food choices with portion control discussed, as well as eating regularly and within a 12 hour window most days. The need to choose "clean , green" food 50 to 75% of the time is discussed, as well as to make water the primary drink and set a goal of 64 ounces water daily.       11/05/2022    8:58 AM 11/05/2022    8:40 AM 09/07/2022    8:30 AM  Weight /BMI  Weight 151 lb 151 lb 1.9 oz 157 lb 12.8 oz  Height  5\' 5"  (1.651 m) 5\' 5"  (1.651 m)  BMI 25.13 kg/m2 25.15 kg/m2 26.26 kg/m2    Near target, excellent result wit Wegovy continue same, target weight is 145 lb

## 2022-11-06 NOTE — Assessment & Plan Note (Signed)
Well corrected on current medication, continue same

## 2022-11-06 NOTE — Assessment & Plan Note (Signed)
Improved Sleep hygiene reviewed

## 2022-11-06 NOTE — Assessment & Plan Note (Signed)
Unchanged, does not want medication

## 2023-01-06 ENCOUNTER — Other Ambulatory Visit: Payer: Self-pay

## 2023-01-27 ENCOUNTER — Emergency Department: Payer: Worker's Compensation

## 2023-01-27 ENCOUNTER — Emergency Department: Payer: Commercial Managed Care - PPO

## 2023-01-27 ENCOUNTER — Other Ambulatory Visit: Payer: Self-pay

## 2023-01-27 ENCOUNTER — Emergency Department
Admission: EM | Admit: 2023-01-27 | Discharge: 2023-01-27 | Disposition: A | Discharge status: Home / Self Care | Payer: Worker's Compensation | Attending: Emergency Medicine | Admitting: Emergency Medicine

## 2023-01-27 ENCOUNTER — Encounter: Payer: Self-pay | Admitting: Emergency Medicine

## 2023-01-27 DIAGNOSIS — S65301A Unspecified injury of deep palmar arch of right hand, initial encounter: Secondary | ICD-10-CM | POA: Diagnosis present

## 2023-01-27 DIAGNOSIS — W01198A Fall on same level from slipping, tripping and stumbling with subsequent striking against other object, initial encounter: Secondary | ICD-10-CM | POA: Insufficient documentation

## 2023-01-27 DIAGNOSIS — S60221A Contusion of right hand, initial encounter: Secondary | ICD-10-CM | POA: Insufficient documentation

## 2023-01-27 DIAGNOSIS — S96911A Strain of unspecified muscle and tendon at ankle and foot level, right foot, initial encounter: Secondary | ICD-10-CM | POA: Diagnosis not present

## 2023-01-27 DIAGNOSIS — Y990 Civilian activity done for income or pay: Secondary | ICD-10-CM | POA: Insufficient documentation

## 2023-01-27 DIAGNOSIS — S7001XA Contusion of right hip, initial encounter: Secondary | ICD-10-CM | POA: Insufficient documentation

## 2023-01-27 DIAGNOSIS — S7011XA Contusion of right thigh, initial encounter: Secondary | ICD-10-CM

## 2023-01-27 MED ORDER — METHOCARBAMOL 500 MG PO TABS
500.0000 mg | ORAL_TABLET | Freq: Four times a day (QID) | ORAL | 0 refills | Status: DC | PRN
  Filled 2023-01-27: qty 30, 8d supply, fill #0

## 2023-01-27 MED ORDER — OXYCODONE-ACETAMINOPHEN 7.5-325 MG PO TABS
1.0000 | ORAL_TABLET | ORAL | 0 refills | Status: DC | PRN
  Filled 2023-01-27: qty 20, 4d supply, fill #0

## 2023-01-27 MED ORDER — NAPROXEN 500 MG PO TABS
500.0000 mg | ORAL_TABLET | Freq: Two times a day (BID) | ORAL | 0 refills | Status: DC
  Filled 2023-01-27: qty 30, 15d supply, fill #0

## 2023-01-27 MED ORDER — OXYCODONE-ACETAMINOPHEN 5-325 MG PO TABS
1.0000 | ORAL_TABLET | Freq: Once | ORAL | Status: AC
  Administered 2023-01-27: 1 via ORAL
  Filled 2023-01-27: qty 1

## 2023-01-27 NOTE — ED Provider Notes (Signed)
[facility] Provider Note    Event Date/Time   First MD Initiated Contact with Patient 01/27/23 2034     (approximate)   History   Fall   HPI  Kristy Travis is a 57 y.o. female with history of hyperlipidemia, GERD, osteoarthritis, and as listed in EMR presents to the emergency department for treatment and evaluation after sustaining a mechanical, nonsyncopal fall while at work tonight.  She slipped on some water and landed on her right hip.  She is now also having some pain in the right ankle.  She has a bruise on her right hand from trying to break her fall.     Physical Exam   Triage Vital Signs: ED Triage Vitals  Encounter Vitals Group     BP 01/27/23 1940 135/77     Systolic BP Percentile --      Diastolic BP Percentile --      Pulse Rate 01/27/23 1940 87     Resp 01/27/23 1940 18     Temp 01/27/23 1940 98.7 F (37.1 C)     Temp Source 01/27/23 1940 Oral     SpO2 01/27/23 1940 100 %     Weight 01/27/23 1938 150 lb (68 kg)     Height 01/27/23 1938 5\' 5"  (1.651 m)     Head Circumference --      Peak Flow --      Pain Score 01/27/23 1938 10     Pain Loc --      Pain Education --      Exclude from Growth Chart --     Most recent vital signs: Vitals:   01/27/23 1940 01/27/23 2148  BP: 135/77 114/62  Pulse: 87 87  Resp: 18 16  Temp: 98.7 F (37.1 C)   SpO2: 100% 100%    General: Awake, no distress.  CV:  Good peripheral perfusion.  Resp:  Normal effort.  Abd:  No distention.  Other:  Tenderness diffuse over the right hip.  Pain increases in hip with knee flexion.  Right ankle tender with passive range of motion.  Contusion noted over the thenar eminence of the right hand.    ED Results / Procedures / Treatments   Labs (all labs ordered are listed, but only abnormal results are displayed) Labs Reviewed - No data to display   EKG  Not indicated.   RADIOLOGY  Image and radiology report reviewed and interpreted  by me. Radiology report consistent with the same.  X-ray image of the right hip negative for bony abnormality.  The right hip shows no acute fracture or dislocation.  X-ray images of the right wrist and right ankle are negative for acute bony abnormality.  PROCEDURES:  Critical Care performed: No  Procedures   MEDICATIONS ORDERED IN ED:  Medications  oxyCODONE-acetaminophen (PERCOCET/ROXICET) 5-325 MG per tablet 1 tablet (1 tablet Oral Given 01/27/23 2107)     IMPRESSION / MDM / ASSESSMENT AND PLAN / ED COURSE   I have reviewed the triage note.  Differential diagnosis includes, but is not limited to, right hip contusion, right hip fracture, ankle sprain, ankle fracture, hand contusion, hand or wrist fracture.  Patient's presentation is most consistent with acute illness / injury with system symptoms.  57 year old female presenting to the emergency department for treatment and evaluation after sustaining a mechanical, nonsyncopal fall while at work.  See HPI for further details.  X-ray of the right hip is negative for bony abnormality.  Patient  does have significant pain with flexion of the right knee or internal/external rotation.  Plan will be to get a CT scan to ensure there is no underlying fracture.  Right ankle is painful as well.  It is not currently swollen but she is wearing her compression stockings.  Right hand and wrist are "sore" but she has a contusion over the volar aspect of the thenar eminence of the right thumb.  There is no snuffbox tenderness.  Plan will be to get x-ray of the right wrist and ankle.  Percocet ordered as she is in significant pain.  Pain improved after a dose of Percocet.  CT of the right hip is negative for acute bony abnormality.  X-ray imaging of the right wrist and right ankle are both negative for acute concerns as well.  Plan will be to discharge patient home with prescriptions for Naprosyn, Robaxin, and Percocet.  She will be provided a work  excuse for the next few days as well.  She was advised to see primary care or orthopedics for any symptoms but does not seem like it is getting better over a week or so.  She was encouraged to return to the emergency department for symptoms that change or worsen she is unable to schedule.      FINAL CLINICAL IMPRESSION(S) / ED DIAGNOSES   Final diagnoses:  Contusion of hip and thigh, right, initial encounter  Strain of right ankle, initial encounter  Contusion of right hand, initial encounter     Rx / DC Orders   ED Discharge Orders          Ordered    oxyCODONE-acetaminophen (PERCOCET) 7.5-325 MG tablet  Every 4 hours PRN        01/27/23 2138    naproxen (NAPROSYN) 500 MG tablet  2 times daily with meals        01/27/23 2138    methocarbamol (ROBAXIN) 500 MG tablet  Every 6 hours PRN        01/27/23 2138             Note:  This document was prepared using Dragon voice recognition software and may include unintentional dictation errors.   Chinita Pester, FNP 01/27/23 2159    Minna Antis, MD 01/28/23 1902

## 2023-01-27 NOTE — ED Triage Notes (Signed)
Pt presents ambulatory to triage via POV with complaints of R hip pain following a fall tonight at work. Pt notes slipping on some water and landed on her R hip. Pt was ambulatory after the fall but endorses pain when putting weight on the extremity. A&Ox4 at this time. Denies hitting her head, LOC, CP or SOB.

## 2023-01-27 NOTE — Discharge Instructions (Signed)
Call Employee [employer] tomorrow.  Follow up with orthopedics for symptoms that are not improving over the next few days.  Return to the ER for symptoms that change or worsen.

## 2023-01-28 ENCOUNTER — Other Ambulatory Visit: Payer: Self-pay

## 2023-02-07 ENCOUNTER — Other Ambulatory Visit: Payer: Self-pay

## 2023-02-07 MED ORDER — IBUPROFEN 800 MG PO TABS
800.0000 mg | ORAL_TABLET | Freq: Three times a day (TID) | ORAL | 1 refills | Status: DC
  Filled 2023-02-07: qty 30, 10d supply, fill #0

## 2023-02-07 MED ORDER — METHOCARBAMOL 500 MG PO TABS
500.0000 mg | ORAL_TABLET | Freq: Two times a day (BID) | ORAL | 0 refills | Status: DC
  Filled 2023-02-07: qty 20, 10d supply, fill #0

## 2023-03-02 ENCOUNTER — Ambulatory Visit: Payer: PRIVATE HEALTH INSURANCE | Attending: Orthopedic Surgery

## 2023-03-02 DIAGNOSIS — M25651 Stiffness of right hip, not elsewhere classified: Secondary | ICD-10-CM | POA: Diagnosis present

## 2023-03-02 DIAGNOSIS — M5459 Other low back pain: Secondary | ICD-10-CM | POA: Diagnosis present

## 2023-03-02 DIAGNOSIS — M25551 Pain in right hip: Secondary | ICD-10-CM | POA: Diagnosis present

## 2023-03-02 NOTE — Therapy (Signed)
OUTPATIENT PHYSICAL THERAPY THORACOLUMBAR EVALUATION   Patient Name: Kristy Travis MRN: [id number] DOB:[birth year], 57 y.o., female Today's Date: 03/02/2023  END OF SESSION:  PT End of Session - 03/02/23 0902     Visit Number 1    Number of Visits 8    Date for PT Re-Evaluation 04/01/23    Authorization Type Worker's Comp    Authorization - Visit Number 1    Authorization - Number of Visits 8    PT Start Time 0904    PT Stop Time 0950    PT Time Calculation (min) 46 min    Activity Tolerance Patient tolerated treatment well    Behavior During Therapy [facility] for tasks assessed/performed             Past Medical History:  Diagnosis Date   Arthritis    Back pain    BACK PAIN, LUMBAR 01/15/2008   Qualifier: Diagnosis of  By: Lodema Hong MD, Margaret     Carpal tunnel syndrome    Chronic fatigue    Constipation    GERD (gastroesophageal reflux disease)    GERD (gastroesophageal reflux disease) 04/06/2017   Glucose intolerance (impaired glucose tolerance) 12/12/2017   Hyperlipidemia    Hypertension    per patient this was prior to weight loss surgery/cn 12/01/17   Joint pain    Kidney stone on right side 12/21/2016   Metabolic syndrome X 09/02/2010   Qualifier: Diagnosis of  By: Lillia Mountain LPN, Brandi     Need for shingles vaccine 12/11/2018   Obesity    Pancreatitis 07/2014   had  gall bladder removed , spent 1 week in hospital   PONV (postoperative nausea and vomiting)    Prediabetes    Primary osteoarthritis of left knee    Seasonal allergies 04/04/2012   Vitamin D deficiency    Past Surgical History:  Procedure Laterality Date   ABDOMINAL HYSTERECTOMY     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   CHOLECYSTECTOMY  07/2014   COLONOSCOPY WITH PROPOFOL N/A 05/01/2021   Procedure: COLONOSCOPY WITH BIOPSY;  Surgeon: Midge Minium, MD;  Location: [facility];  Service: Endoscopy;  Laterality: N/A;   cyst removed from left foot and bone removed from left  foot  April and June 2010   DILATION AND CURETTAGE OF UTERUS     ESOPHAGOGASTRODUODENOSCOPY N/A 03/03/2016   Procedure: ESOPHAGOGASTRODUODENOSCOPY (EGD);  Surgeon: Malissa Hippo, MD;  Location: [facility];  Service: Endoscopy;  Laterality: N/A;  3:00 - moved to 2:00 - Ann to notify pt   ESOPHAGOGASTRODUODENOSCOPY (EGD) WITH PROPOFOL N/A 05/01/2021   Procedure: ESOPHAGOGASTRODUODENOSCOPY (EGD) WITH PROPOFOL;  Surgeon: Midge Minium, MD;  Location: [facility];  Service: Endoscopy;  Laterality: N/A;   KNEE ARTHROSCOPY Right 12/12/2017   Procedure: ARTHROSCOPY KNEE, MEDIAL CHONDROPLASTY, LOOSE BODY REMOVAL,PARTIAL LATERAL MENISCECTOMY;  Surgeon: Donato Heinz, MD;  Location: [facility];  Service: Orthopedics;  Laterality: Right;   KNEE ARTHROSCOPY Left 06/02/2018   Procedure: ARTHROSCOPY KNEE, MEDIAL CHONDROPLASTY, PARTIAL MEDIAL MENISCECTOMY;  Surgeon: Donato Heinz, MD;  Location: [facility];  Service: Orthopedics;  Laterality: Left;   laparoscopy to eval dyspepsia  2004   POLYPECTOMY N/A 05/01/2021   Procedure: POLYPECTOMY;  Surgeon: Midge Minium, MD;  Location: [facility];  Service: Endoscopy;  Laterality: N/A;   TOTAL VAGINAL HYSTERECTOMY  06/2014   with BSO   Patient Active Problem List   Diagnosis Date Noted   Insomnia 03/16/2022   Chronic pain of left ankle  11/24/2021   Baker's cyst of knee, left 11/24/2021   Overweight (BMI 25.0-29.9) 11/24/2021   Polyphagia 11/09/2021   Hair loss 11/09/2021   Constipation 08/05/2021   Polyp of transverse colon    Chronic foot pain, left 01/22/2021   Prediabetes 01/17/2020   Mass of soft tissue of chest 01/17/2020   Primary osteoarthritis of left knee 12/12/2017   GERD (gastroesophageal reflux disease) 04/06/2017   Chronic fatigue disorder 12/22/2016   Annual physical exam 12/30/2012   Vitamin D deficiency 08/27/2011   Pain in joint, multiple sites 09/02/2010   Hyperlipidemia 02/06/2010   Back pain 01/15/2008    PCP:     Kerri Perches, MD    REFERRING PROVIDER: Gean Birchwood, MD  REFERRING DIAG: M54.50 (ICD-10-CM) - Low back pain S70.01XA (ICD-10-CM) - Contusion of right hip  Rationale for Evaluation and Treatment: Rehabilitation  THERAPY DIAG:  Pain in right hip - Plan: PT plan of care cert/re-cert  Other low back pain - Plan: PT plan of care cert/re-cert  Stiffness of right hip, not elsewhere classified - Plan: PT plan of care cert/re-cert  ONSET DATE: 01/27/2023  SUBJECTIVE:                                                                                                                                                                                           SUBJECTIVE STATEMENT: See Pertinent history   No blood pressure problems per pt.  No latex allergies     PERTINENT HISTORY:  Low back and R hip pain secondary to a fall on 01/27/2023. Pt slipped on water, pt fell backwards. R posterior hip around the bone area is bothering her more. Pain is not as bad as it was since the fall. Pt was unable to perform R hip flexion at the time of injury.   Aggravating factors: walking up steps and incline, sitting, walking  Relieving factors: hot bath, heating pad   Pt also has chronic central low back pain which was aggravated from the fall. Denies loss of bowel or bladder function, or LE paresthesia    PAIN:  Are you having pain? R posterior hip: 6/10 currently (pt sitting on a chair), 6/10 at most for the past 3 weeks (10+/10 at time of injury)  PRECAUTIONS: None  RED FLAGS: None   WEIGHT BEARING RESTRICTIONS: No  FALLS:  Has patient fallen in last 6 months? Yes. Number of falls 1 with resulting injury needing PT eval.   LIVING ENVIRONMENT:  OCCUPATION: Pt is a housekeeper   PLOF: Independent  PATIENT GOALS: be pain free  NEXT MD VISIT: unsure, probably 03/23/2023  OBJECTIVE:  DIAGNOSTIC FINDINGS:  CLINICAL DATA:  Hip trauma with fracture suspected. X-ray done.  [employer]. Slip and fall injury.   EXAM: CT OF THE RIGHT HIP WITHOUT CONTRAST   TECHNIQUE: Multidetector CT imaging of the right hip was performed according to the standard protocol. Multiplanar CT image reconstructions were also generated.   RADIATION DOSE REDUCTION: This exam was performed according to the departmental dose-optimization program which includes automated exposure control, adjustment of the mA and/or kV according to patient size and/or use of iterative reconstruction technique.   COMPARISON:  Right hip radiographs 01/27/2023   FINDINGS: Bones/Joint/Cartilage   Degenerative changes in the right hip with joint space narrowing and small osteophyte formation. No evidence of acute fracture or dislocation. Visualized portion of the pelvis appears intact.   Ligaments   Suboptimally assessed by CT.   Muscles and Tendons   No intramuscular mass or collection.   Soft tissues   No abnormal soft tissue infiltration or collection. Visualized intrapelvic structures appear intact.   IMPRESSION: No acute fracture or dislocation demonstrated in the right hip. Mild degenerative changes.     CLINICAL DATA:  Hip pain   EXAM: DG HIP (WITH OR WITHOUT PELVIS) 2-3V RIGHT   COMPARISON:  None Available.   FINDINGS: Pubic symphysis appears intact. SI joints are non widened. No fracture or malalignment. Mild hip degenerative change   IMPRESSION: Mild hip degenerative change.        PATIENT SURVEYS:  Lumbar spine FOTO 66 (03/02/2023)  SCREENING FOR RED FLAGS: Bowel or bladder incontinence: No  Cauda equina syndrome: No   COGNITION: Overall cognitive status: Within functional limits for tasks assessed     SENSATION: WFL    POSTURE: forward neck, thoracic kyphosis, B protracted shoulders, R shoulder lower, L thoracic convexity, decreased lumbar lordosis, R knee higher, R hip in ER  PALPATION: TTP sacrum (superior, and R lateral), TTP R proximal  attachment of piriformis muscle  TTP R L5 > L4 > L3 TP      LUMBAR ROM:   AROM eval  Flexion WFL with R low back pain  Extension WFL  Right lateral flexion WFL with R low back pain  Left lateral flexion WFL with R low back pressure  Right rotation WFL   Left rotation WFL   (Blank rows = not tested)  LOWER EXTREMITY ROM:     PROM Right eval Left eval  Hip flexion    Hip extension    Hip abduction    Hip adduction    Hip internal rotation 12 (stiff end feel) 18 (stiff end feel and posterior hip discomfort)  Hip external rotation    Knee flexion    Knee extension    Ankle dorsiflexion    Ankle plantarflexion    Ankle inversion    Ankle eversion     (Blank rows = not tested)  LOWER EXTREMITY MMT:    MMT Right eval Left eval  Hip flexion 4-  4  Hip extension 4- 3+   Hip abduction 4 4+   Hip adduction    Hip internal rotation    Hip external rotation    Knee flexion 5 4+  Knee extension 5 5  Ankle dorsiflexion    Ankle plantarflexion    Ankle inversion    Ankle eversion     (Blank rows = not tested)  LUMBAR SPECIAL TESTS:  (-) repeated flexion test (+) Slump test R LE with reproduction of R posterior hip symptoms.    (+) R  Piriformis test with reproduction of symptoms   Long sit test suggests anterior nutation of R ASIS.   FUNCTIONAL TESTS:    GAIT: Distance walked: 60 Assistive device utilized: None Level of assistance: Complete Independence Comments: antalgic, decreased stance L LE  TODAY'S TREATMENT:                                                                                                                              DATE: 03/02/2023   Therapeutic exercise  Setaed R hip piriformois stretch 30 seconds x 2  Decreased pain  Improved exercise technique, movement at target joints, use of target muscles after mod verbal, visual, tactile cues.      PATIENT EDUCATION:  Education details: there-ex, HEP Person educated:  Patient Education method: Explanation, Demonstration, Tactile cues, Verbal cues, and Handouts Education comprehension: verbalized understanding and returned demonstration  HOME EXERCISE PROGRAM: Access Code: [email or web address] URL: [email or web address] Date: 03/02/2023 Prepared by: Loralyn Freshwater  Exercises - Seated Piriformis Stretch  - 3 x daily - 7 x weekly - 1 sets - 3-5 reps - 30 seconds hold  ASSESSMENT:  CLINICAL IMPRESSION: Patient is a 57 y.o. female who was seen today for physical therapy evaluation and treatment for R hip and low back pain. She also presents with altered gait pattern and posture, TTP, R sciatic neural tension, positive special test suggesting low back and piriformis involvement on R side, bilateral glute med and max weakness, limited hip IR R > L, and difficulty performing tasks that involve ambulation up inclines, and stairs secondary to pain. Pt will benefit from skilled physical therapy services to address the aforementioned deficits.   OBJECTIVE IMPAIRMENTS: Abnormal gait, difficulty walking, decreased ROM, decreased strength, improper body mechanics, postural dysfunction, and pain.   ACTIVITY LIMITATIONS: sitting, stairs, and locomotion level  PARTICIPATION LIMITATIONS: occupation  PERSONAL FACTORS: Age, Fitness, Profession, and 3+ comorbidities: arthritis, chronic back pain, HTN  are also affecting patient's functional outcome.   REHAB POTENTIAL: Fair    CLINICAL DECISION MAKING: Stable/uncomplicated  EVALUATION COMPLEXITY: Low   GOALS: Goals reviewed with patient? Yes  SHORT TERM GOALS: Target date: 03/25/2023   Pt will be independent with her initial HEP to decrease pain, improve strength, function, and ability to ambulate up inclines and stairs more comfortably.  Baseline: Pt has started her initial HEP (03/02/2023) Goal status: INITIAL   LONG TERM GOALS: Target date: 04/01/2023    Pt will have a decrease in R posterior hip pain to  2/10 or less at worst to promote ability to ambulate and negotiate stairs more comfortably.  Baseline: 6/10 at worst for the past 3 weeks (03/02/2023) Goal status: INITIAL  2.  Pt will improve R hip IR PROM at 90/90 position to at least 25 degrees to help decrease pain and improve ability to ambulate and negotiate stairs more comfortably.  Baseline:  Hip internal rotation Right: 12 degrees (stiff end feel)  (03/02/2023) Goal status: INITIAL  3.  Pt will improve her lumbar spine FOTO score by at least 10 points as a demonstration of improved function.   Baseline: Lumbar spine FOTO 66 (03/02/2023) Goal status: INITIAL  4.  Pt will improve her R hip extension and abduction strength by at least 1/2 MMT grade to promote ability to perform standing tasks more comfortably.  Baseline:  MMT Right eval Left eval  Hip flexion 4-  4  Hip extension 4- 3+   Hip abduction 4 4+   (03/02/2023) Goal status: INITIAL   PLAN:  PT FREQUENCY: 2x/week  PT DURATION: 4 weeks  PLANNED INTERVENTIONS: Therapeutic exercises, Therapeutic activity, Neuromuscular re-education, Gait training, Patient/Family education, Joint mobilization, Joint manipulation, Dry Needling, Electrical stimulation, Spinal manipulation, and Spinal mobilization.  PLAN FOR NEXT SESSION: improve R hip IR, posture, trunk and hip strength, manual techniques, modalities PRN  Loralyn Freshwater PT, DPT  03/02/2023, 12:21 PM

## 2023-03-10 ENCOUNTER — Ambulatory Visit: Payer: Commercial Managed Care - PPO | Attending: Orthopedic Surgery

## 2023-03-10 DIAGNOSIS — M25651 Stiffness of right hip, not elsewhere classified: Secondary | ICD-10-CM | POA: Insufficient documentation

## 2023-03-10 DIAGNOSIS — M5459 Other low back pain: Secondary | ICD-10-CM | POA: Insufficient documentation

## 2023-03-10 DIAGNOSIS — M25551 Pain in right hip: Secondary | ICD-10-CM | POA: Diagnosis not present

## 2023-03-10 NOTE — Therapy (Signed)
OUTPATIENT PHYSICAL THERAPY  Treatment   Patient Name: Kristy Travis MRN: [id number] DOB:25-Oct-1965, 57 y.o., female Today's Date: 03/10/2023  END OF SESSION:  PT End of Session - 03/10/23 0949     Visit Number 2    Number of Visits 8    Date for PT Re-Evaluation 04/01/23    Authorization Type Worker's Comp    Authorization - Number of Visits 8    PT Start Time [phone]    PT Stop Time 1029    PT Time Calculation (min) 40 min    Activity Tolerance Patient tolerated treatment well    Behavior During Therapy WFL for tasks assessed/performed              Past Medical History:  Diagnosis Date   Arthritis    Back pain    BACK PAIN, LUMBAR 01/15/2008   Qualifier: Diagnosis of  By: Lodema Hong MD, Margaret     Carpal tunnel syndrome    Chronic fatigue    Constipation    GERD (gastroesophageal reflux disease)    GERD (gastroesophageal reflux disease) 04/06/2017   Glucose intolerance (impaired glucose tolerance) 12/12/2017   Hyperlipidemia    Hypertension    per patient this was prior to weight loss surgery/cn 12/01/17   Joint pain    Kidney stone on right side 12/21/2016   Metabolic syndrome X 09/02/2010   Qualifier: Diagnosis of  By: Lillia Mountain LPN, Brandi     Need for shingles vaccine 12/11/2018   Obesity    Pancreatitis 07/2014   had  gall bladder removed , spent 1 week in hospital   PONV (postoperative nausea and vomiting)    Prediabetes    Primary osteoarthritis of left knee    Seasonal allergies 04/04/2012   Vitamin D deficiency    Past Surgical History:  Procedure Laterality Date   ABDOMINAL HYSTERECTOMY     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   CHOLECYSTECTOMY  07/2014   COLONOSCOPY WITH PROPOFOL N/A 05/01/2021   Procedure: COLONOSCOPY WITH BIOPSY;  Surgeon: Midge Minium, MD;  Location: [facility];  Service: Endoscopy;  Laterality: N/A;   cyst removed from left foot and bone removed from left foot  April and June 2010   DILATION AND  CURETTAGE OF UTERUS     ESOPHAGOGASTRODUODENOSCOPY N/A 03/03/2016   Procedure: ESOPHAGOGASTRODUODENOSCOPY (EGD);  Surgeon: Malissa Hippo, MD;  Location: [facility];  Service: Endoscopy;  Laterality: N/A;  3:00 - moved to 2:00 - Ann to notify pt   ESOPHAGOGASTRODUODENOSCOPY (EGD) WITH PROPOFOL N/A 05/01/2021   Procedure: ESOPHAGOGASTRODUODENOSCOPY (EGD) WITH PROPOFOL;  Surgeon: Midge Minium, MD;  Location: [facility];  Service: Endoscopy;  Laterality: N/A;   KNEE ARTHROSCOPY Right 12/12/2017   Procedure: ARTHROSCOPY KNEE, MEDIAL CHONDROPLASTY, LOOSE BODY REMOVAL,PARTIAL LATERAL MENISCECTOMY;  Surgeon: Donato Heinz, MD;  Location: [facility];  Service: Orthopedics;  Laterality: Right;   KNEE ARTHROSCOPY Left 06/02/2018   Procedure: ARTHROSCOPY KNEE, MEDIAL CHONDROPLASTY, PARTIAL MEDIAL MENISCECTOMY;  Surgeon: Donato Heinz, MD;  Location: [facility];  Service: Orthopedics;  Laterality: Left;   laparoscopy to eval dyspepsia  2004   POLYPECTOMY N/A 05/01/2021   Procedure: POLYPECTOMY;  Surgeon: Midge Minium, MD;  Location: [facility];  Service: Endoscopy;  Laterality: N/A;   TOTAL VAGINAL HYSTERECTOMY  06/2014   with BSO   Patient Active Problem List   Diagnosis Date Noted   Insomnia 03/16/2022   Chronic pain of left ankle 11/24/2021   Baker's cyst of knee,  left 11/24/2021   Overweight (BMI 25.0-29.9) 11/24/2021   Polyphagia 11/09/2021   Hair loss 11/09/2021   Constipation 08/05/2021   Polyp of transverse colon    Chronic foot pain, left 01/22/2021   Prediabetes 01/17/2020   Mass of soft tissue of chest 01/17/2020   Primary osteoarthritis of left knee 12/12/2017   GERD (gastroesophageal reflux disease) 04/06/2017   Chronic fatigue disorder 12/22/2016   Annual physical exam 12/30/2012   Vitamin D deficiency 08/27/2011   Pain in joint, multiple sites 09/02/2010   Hyperlipidemia 02/06/2010   Back pain 01/15/2008    PCP:    Kerri Perches, MD    REFERRING  PROVIDER: Gean Birchwood, MD  REFERRING DIAG: M54.50 (ICD-10-CM) - Low back pain S70.01XA (ICD-10-CM) - Contusion of right hip  Rationale for Evaluation and Treatment: Rehabilitation  THERAPY DIAG:  Pain in right hip  Other low back pain  Stiffness of right hip, not elsewhere classified  ONSET DATE: 01/27/2023  SUBJECTIVE:                                                                                                                                                                                           SUBJECTIVE STATEMENT: Back is alright, still has pain in her R hip. Pain goes down her R leg when driving (R posterior hip, not past knees along the sciatic distribution. 6/10 R posterior hip pain currently          PERTINENT HISTORY:  Low back and R hip pain secondary to a fall on 01/27/2023. Pt slipped on water, pt fell backwards. R posterior hip around the bone area is bothering her more. Pain is not as bad as it was since the fall. Pt was unable to perform R hip flexion at the time of injury.   Aggravating factors: walking up steps and incline, sitting, walking  Relieving factors: hot bath, heating pad   Pt also has chronic central low back pain which was aggravated from the fall. Denies loss of bowel or bladder function, or LE paresthesia    No blood pressure problems per pt.  No latex allergies  PAIN:  Are you having pain? R posterior hip: 6/10 currently (pt sitting on a chair), 6/10 at most for the past 3 weeks (10+/10 at time of injury)  PRECAUTIONS: None  RED FLAGS: None   WEIGHT BEARING RESTRICTIONS: No  FALLS:  Has patient fallen in last 6 months? Yes. Number of falls 1 with resulting injury needing PT eval.   LIVING ENVIRONMENT:  OCCUPATION: Pt is a housekeeper   PLOF: Independent  PATIENT GOALS: be pain free  NEXT MD  VISIT: unsure, probably 03/23/2023  OBJECTIVE:   DIAGNOSTIC FINDINGS:  CLINICAL DATA:  Hip trauma with fracture suspected.  X-ray done. [employer]. Slip and fall injury.   EXAM: CT OF THE RIGHT HIP WITHOUT CONTRAST   TECHNIQUE: Multidetector CT imaging of the right hip was performed according to the standard protocol. Multiplanar CT image reconstructions were also generated.   RADIATION DOSE REDUCTION: This exam was performed according to the departmental dose-optimization program which includes automated exposure control, adjustment of the mA and/or kV according to patient size and/or use of iterative reconstruction technique.   COMPARISON:  Right hip radiographs 01/27/2023   FINDINGS: Bones/Joint/Cartilage   Degenerative changes in the right hip with joint space narrowing and small osteophyte formation. No evidence of acute fracture or dislocation. Visualized portion of the pelvis appears intact.   Ligaments   Suboptimally assessed by CT.   Muscles and Tendons   No intramuscular mass or collection.   Soft tissues   No abnormal soft tissue infiltration or collection. Visualized intrapelvic structures appear intact.   IMPRESSION: No acute fracture or dislocation demonstrated in the right hip. Mild degenerative changes.     CLINICAL DATA:  Hip pain   EXAM: DG HIP (WITH OR WITHOUT PELVIS) 2-3V RIGHT   COMPARISON:  None Available.   FINDINGS: Pubic symphysis appears intact. SI joints are non widened. No fracture or malalignment. Mild hip degenerative change   IMPRESSION: Mild hip degenerative change.        PATIENT SURVEYS:  Lumbar spine FOTO 66 (03/02/2023)  SCREENING FOR RED FLAGS: Bowel or bladder incontinence: No  Cauda equina syndrome: No   COGNITION: Overall cognitive status: Within functional limits for tasks assessed     SENSATION: WFL    POSTURE: forward neck, thoracic kyphosis, B protracted shoulders, R shoulder lower, L thoracic convexity, decreased lumbar lordosis, R knee higher, R hip in ER  PALPATION: TTP sacrum (superior, and R lateral), TTP R  proximal attachment of piriformis muscle  TTP R L5 > L4 > L3 TP      LUMBAR ROM:   AROM eval  Flexion WFL with R low back pain  Extension WFL  Right lateral flexion WFL with R low back pain  Left lateral flexion WFL with R low back pressure  Right rotation WFL   Left rotation WFL   (Blank rows = not tested)  LOWER EXTREMITY ROM:     PROM Right eval Left eval  Hip flexion    Hip extension    Hip abduction    Hip adduction    Hip internal rotation 12 (stiff end feel) 18 (stiff end feel and posterior hip discomfort)  Hip external rotation    Knee flexion    Knee extension    Ankle dorsiflexion    Ankle plantarflexion    Ankle inversion    Ankle eversion     (Blank rows = not tested)  LOWER EXTREMITY MMT:    MMT Right eval Left eval  Hip flexion 4-  4  Hip extension 4- 3+   Hip abduction 4 4+   Hip adduction    Hip internal rotation    Hip external rotation    Knee flexion 5 4+  Knee extension 5 5  Ankle dorsiflexion    Ankle plantarflexion    Ankle inversion    Ankle eversion     (Blank rows = not tested)  LUMBAR SPECIAL TESTS:  (-) repeated flexion test (+) Slump test R LE with reproduction of R  posterior hip symptoms.    (+) R Piriformis test with reproduction of symptoms   Long sit test suggests anterior nutation of R ASIS.   FUNCTIONAL TESTS:    GAIT: Distance walked: 60 Assistive device utilized: None Level of assistance: Complete Independence Comments: antalgic, decreased stance L LE  TODAY'S TREATMENT:                                                                                                                              DATE: 03/10/2023   Therapeutic exercise  Seated R hip piriformois stretch 1 minute x 3  Seated hip adduction isometrics folded pillow (2x) squeeze 10x5 seconds for 3 sets  Seated R hip IR 5x5 seconds for 2 sets  Single leg dead lift with contralateral UE assist   R 5x3  Supine R hip IR stretch with PT    30 seconds. R posterior hip burning sensation  Supine with R hip at 90/90 position  R hip IR PROM 10x3   Alleviated R posterior hip burning sensation  Hooklying transversus abdominis contraction 10x5 second holds for 3 sets  L S/L R hip reverse clamshell with folded pillow between knees 10x3   Supine SKTC   R 10x5 seconds for 2 sets    Improved exercise technique, movement at target joints, use of target muscles after mod verbal, visual, tactile cues.      PATIENT EDUCATION:  Education details: there-ex, HEP Person educated: Patient Education method: Explanation, Demonstration, Tactile cues, Verbal cues, and Handouts Education comprehension: verbalized understanding and returned demonstration  HOME EXERCISE PROGRAM: Access Code: [email or web address] URL: [email or web address] Date: 03/02/2023 Prepared by: Loralyn Freshwater  Exercises - Seated Piriformis Stretch  - 3 x daily - 7 x weekly - 1 sets - 3-5 reps - 30 seconds hold - Seated Hip Adduction Isometrics with Ball  - 1 x daily - 7 x weekly - 3 sets - 10 reps - 5 seconds hold - Supine Transversus Abdominis Bracing - Hands on Stomach  - 5 x daily - 7 x weekly - 3 sets - 10 reps - 5 seconds hold   ASSESSMENT:  CLINICAL IMPRESSION:   Worked on decreasing R piriformis muscle tension and improving hip IR mobility and R glue max strength to decrease pressure to R sciatic nerve. Weakness in R glute max muscle observed. Decreased R posterior hip pain reported after session. Pt will benefit from continued skilled physical therapy services to decrease pain, improve strength and function.         OBJECTIVE IMPAIRMENTS: Abnormal gait, difficulty walking, decreased ROM, decreased strength, improper body mechanics, postural dysfunction, and pain.   ACTIVITY LIMITATIONS: sitting, stairs, and locomotion level  PARTICIPATION LIMITATIONS: occupation  PERSONAL FACTORS: Age, Fitness, Profession, and 3+ comorbidities:  arthritis, chronic back pain, HTN  are also affecting patient's functional outcome.   REHAB POTENTIAL: Fair    CLINICAL DECISION MAKING: Stable/uncomplicated  EVALUATION COMPLEXITY: Low   GOALS: Goals reviewed with patient?  Yes  SHORT TERM GOALS: Target date: 03/25/2023   Pt will be independent with her initial HEP to decrease pain, improve strength, function, and ability to ambulate up inclines and stairs more comfortably.  Baseline: Pt has started her initial HEP (03/02/2023) Goal status: INITIAL   LONG TERM GOALS: Target date: 04/01/2023    Pt will have a decrease in R posterior hip pain to 2/10 or less at worst to promote ability to ambulate and negotiate stairs more comfortably.  Baseline: 6/10 at worst for the past 3 weeks (03/02/2023) Goal status: INITIAL  2.  Pt will improve R hip IR PROM at 90/90 position to at least 25 degrees to help decrease pain and improve ability to ambulate and negotiate stairs more comfortably.  Baseline:  Hip internal rotation Right: 12 degrees (stiff end feel)  (03/02/2023) Goal status: INITIAL  3.  Pt will improve her lumbar spine FOTO score by at least 10 points as a demonstration of improved function.   Baseline: Lumbar spine FOTO 66 (03/02/2023) Goal status: INITIAL  4.  Pt will improve her R hip extension and abduction strength by at least 1/2 MMT grade to promote ability to perform standing tasks more comfortably.  Baseline:  MMT Right eval Left eval  Hip flexion 4-  4  Hip extension 4- 3+   Hip abduction 4 4+   (03/02/2023) Goal status: INITIAL   PLAN:  PT FREQUENCY: 2x/week  PT DURATION: 4 weeks  PLANNED INTERVENTIONS: Therapeutic exercises, Therapeutic activity, Neuromuscular re-education, Gait training, Patient/Family education, Joint mobilization, Joint manipulation, Dry Needling, Electrical stimulation, Spinal manipulation, and Spinal mobilization.  PLAN FOR NEXT SESSION: improve R hip IR, posture, trunk and hip  strength, manual techniques, modalities PRN  Loralyn Freshwater PT, DPT  03/10/2023, 10:42 AM

## 2023-03-15 ENCOUNTER — Ambulatory Visit: Payer: PRIVATE HEALTH INSURANCE | Attending: Orthopedic Surgery

## 2023-03-15 DIAGNOSIS — M25551 Pain in right hip: Secondary | ICD-10-CM | POA: Diagnosis present

## 2023-03-15 DIAGNOSIS — M25651 Stiffness of right hip, not elsewhere classified: Secondary | ICD-10-CM | POA: Diagnosis present

## 2023-03-15 DIAGNOSIS — M5459 Other low back pain: Secondary | ICD-10-CM | POA: Diagnosis present

## 2023-03-15 NOTE — Therapy (Signed)
OUTPATIENT PHYSICAL THERAPY  Treatment   Patient Name: Kristy Travis MRN: [id number] DOB:May 25, 1966, 57 y.o., female Today's Date: 03/15/2023  END OF SESSION:  PT End of Session - 03/15/23 0727     Visit Number 3    Number of Visits 8    Date for PT Re-Evaluation 04/01/23    Authorization Type Worker's Comp    Authorization - Number of Visits 8    PT Start Time 0729    PT Stop Time 0816    PT Time Calculation (min) 47 min    Activity Tolerance Patient tolerated treatment well    Behavior During Therapy [facility] for tasks assessed/performed               Past Medical History:  Diagnosis Date   Arthritis    Back pain    BACK PAIN, LUMBAR 01/15/2008   Qualifier: Diagnosis of  By: Lodema Hong MD, Margaret     Carpal tunnel syndrome    Chronic fatigue    Constipation    GERD (gastroesophageal reflux disease)    GERD (gastroesophageal reflux disease) 04/06/2017   Glucose intolerance (impaired glucose tolerance) 12/12/2017   Hyperlipidemia    Hypertension    per patient this was prior to weight loss surgery/cn 12/01/17   Joint pain    Kidney stone on right side 12/21/2016   Metabolic syndrome X 09/02/2010   Qualifier: Diagnosis of  By: Lillia Mountain LPN, Brandi     Need for shingles vaccine 12/11/2018   Obesity    Pancreatitis 07/2014   had  gall bladder removed , spent 1 week in hospital   PONV (postoperative nausea and vomiting)    Prediabetes    Primary osteoarthritis of left knee    Seasonal allergies 04/04/2012   Vitamin D deficiency    Past Surgical History:  Procedure Laterality Date   ABDOMINAL HYSTERECTOMY     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   CHOLECYSTECTOMY  07/2014   COLONOSCOPY WITH PROPOFOL N/A 05/01/2021   Procedure: COLONOSCOPY WITH BIOPSY;  Surgeon: Midge Minium, MD;  Location: [facility];  Service: Endoscopy;  Laterality: N/A;   cyst removed from left foot and bone removed from left foot  April and June 2010   DILATION AND  CURETTAGE OF UTERUS     ESOPHAGOGASTRODUODENOSCOPY N/A 03/03/2016   Procedure: ESOPHAGOGASTRODUODENOSCOPY (EGD);  Surgeon: Malissa Hippo, MD;  Location: [facility];  Service: Endoscopy;  Laterality: N/A;  3:00 - moved to 2:00 - Ann to notify pt   ESOPHAGOGASTRODUODENOSCOPY (EGD) WITH PROPOFOL N/A 05/01/2021   Procedure: ESOPHAGOGASTRODUODENOSCOPY (EGD) WITH PROPOFOL;  Surgeon: Midge Minium, MD;  Location: [facility];  Service: Endoscopy;  Laterality: N/A;   KNEE ARTHROSCOPY Right 12/12/2017   Procedure: ARTHROSCOPY KNEE, MEDIAL CHONDROPLASTY, LOOSE BODY REMOVAL,PARTIAL LATERAL MENISCECTOMY;  Surgeon: Donato Heinz, MD;  Location: [facility];  Service: Orthopedics;  Laterality: Right;   KNEE ARTHROSCOPY Left 06/02/2018   Procedure: ARTHROSCOPY KNEE, MEDIAL CHONDROPLASTY, PARTIAL MEDIAL MENISCECTOMY;  Surgeon: Donato Heinz, MD;  Location: [facility];  Service: Orthopedics;  Laterality: Left;   laparoscopy to eval dyspepsia  2004   POLYPECTOMY N/A 05/01/2021   Procedure: POLYPECTOMY;  Surgeon: Midge Minium, MD;  Location: [facility];  Service: Endoscopy;  Laterality: N/A;   TOTAL VAGINAL HYSTERECTOMY  06/2014   with BSO   Patient Active Problem List   Diagnosis Date Noted   Insomnia 03/16/2022   Chronic pain of left ankle 11/24/2021   Baker's cyst of  knee, left 11/24/2021   Overweight (BMI 25.0-29.9) 11/24/2021   Polyphagia 11/09/2021   Hair loss 11/09/2021   Constipation 08/05/2021   Polyp of transverse colon    Chronic foot pain, left 01/22/2021   Prediabetes 01/17/2020   Mass of soft tissue of chest 01/17/2020   Primary osteoarthritis of left knee 12/12/2017   GERD (gastroesophageal reflux disease) 04/06/2017   Chronic fatigue disorder 12/22/2016   Annual physical exam 12/30/2012   Vitamin D deficiency 08/27/2011   Pain in joint, multiple sites 09/02/2010   Hyperlipidemia 02/06/2010   Back pain 01/15/2008    PCP:    Kerri Perches, MD    REFERRING  PROVIDER: Gean Birchwood, MD  REFERRING DIAG: M54.50 (ICD-10-CM) - Low back pain S70.01XA (ICD-10-CM) - Contusion of right hip  Rationale for Evaluation and Treatment: Rehabilitation  THERAPY DIAG:  Pain in right hip  Other low back pain  Stiffness of right hip, not elsewhere classified  ONSET DATE: 01/27/2023  SUBJECTIVE:                                                                                                                                                                                           SUBJECTIVE STATEMENT: Back and hip are about the same. No low back or R hip pain when driving. 3/10 R hip and low back pain currently         PERTINENT HISTORY:  Low back and R hip pain secondary to a fall on 01/27/2023. Pt slipped on water, pt fell backwards. R posterior hip around the bone area is bothering her more. Pain is not as bad as it was since the fall. Pt was unable to perform R hip flexion at the time of injury.   Aggravating factors: walking up steps and incline, sitting, walking  Relieving factors: hot bath, heating pad   Pt also has chronic central low back pain which was aggravated from the fall. Denies loss of bowel or bladder function, or LE paresthesia    No blood pressure problems per pt.  No latex allergies  PAIN:  Are you having pain? R posterior hip: 6/10 currently (pt sitting on a chair), 6/10 at most for the past 3 weeks (10+/10 at time of injury)  PRECAUTIONS: None  RED FLAGS: None   WEIGHT BEARING RESTRICTIONS: No  FALLS:  Has patient fallen in last 6 months? Yes. Number of falls 1 with resulting injury needing PT eval.   LIVING ENVIRONMENT:  OCCUPATION: Pt is a housekeeper   PLOF: Independent  PATIENT GOALS: be pain free  NEXT MD VISIT: unsure, probably 03/23/2023  OBJECTIVE:   DIAGNOSTIC FINDINGS:  CLINICAL DATA:  Hip trauma with fracture suspected. X-ray done. [employer]. Slip and fall injury.   EXAM: CT OF THE RIGHT  HIP WITHOUT CONTRAST   TECHNIQUE: Multidetector CT imaging of the right hip was performed according to the standard protocol. Multiplanar CT image reconstructions were also generated.   RADIATION DOSE REDUCTION: This exam was performed according to the departmental dose-optimization program which includes automated exposure control, adjustment of the mA and/or kV according to patient size and/or use of iterative reconstruction technique.   COMPARISON:  Right hip radiographs 01/27/2023   FINDINGS: Bones/Joint/Cartilage   Degenerative changes in the right hip with joint space narrowing and small osteophyte formation. No evidence of acute fracture or dislocation. Visualized portion of the pelvis appears intact.   Ligaments   Suboptimally assessed by CT.   Muscles and Tendons   No intramuscular mass or collection.   Soft tissues   No abnormal soft tissue infiltration or collection. Visualized intrapelvic structures appear intact.   IMPRESSION: No acute fracture or dislocation demonstrated in the right hip. Mild degenerative changes.     CLINICAL DATA:  Hip pain   EXAM: DG HIP (WITH OR WITHOUT PELVIS) 2-3V RIGHT   COMPARISON:  None Available.   FINDINGS: Pubic symphysis appears intact. SI joints are non widened. No fracture or malalignment. Mild hip degenerative change   IMPRESSION: Mild hip degenerative change.        PATIENT SURVEYS:  Lumbar spine FOTO 66 (03/02/2023)  SCREENING FOR RED FLAGS: Bowel or bladder incontinence: No  Cauda equina syndrome: No   COGNITION: Overall cognitive status: Within functional limits for tasks assessed     SENSATION: WFL    POSTURE: forward neck, thoracic kyphosis, B protracted shoulders, R shoulder lower, L thoracic convexity, decreased lumbar lordosis, R knee higher, R hip in ER  PALPATION: TTP sacrum (superior, and R lateral), TTP R proximal attachment of piriformis muscle  TTP R L5 > L4 > L3  TP      LUMBAR ROM:   AROM eval  Flexion WFL with R low back pain  Extension WFL  Right lateral flexion WFL with R low back pain  Left lateral flexion WFL with R low back pressure  Right rotation WFL   Left rotation WFL   (Blank rows = not tested)  LOWER EXTREMITY ROM:     PROM Right eval Left eval  Hip flexion    Hip extension    Hip abduction    Hip adduction    Hip internal rotation 12 (stiff end feel) 18 (stiff end feel and posterior hip discomfort)  Hip external rotation    Knee flexion    Knee extension    Ankle dorsiflexion    Ankle plantarflexion    Ankle inversion    Ankle eversion     (Blank rows = not tested)  LOWER EXTREMITY MMT:    MMT Right eval Left eval  Hip flexion 4-  4  Hip extension 4- 3+   Hip abduction 4 4+   Hip adduction    Hip internal rotation    Hip external rotation    Knee flexion 5 4+  Knee extension 5 5  Ankle dorsiflexion    Ankle plantarflexion    Ankle inversion    Ankle eversion     (Blank rows = not tested)  LUMBAR SPECIAL TESTS:  (-) repeated flexion test (+) Slump test R LE with reproduction of R posterior hip symptoms.    (+) R Piriformis test with  reproduction of symptoms   Long sit test suggests anterior nutation of R ASIS.   FUNCTIONAL TESTS:    GAIT: Distance walked: 60 Assistive device utilized: None Level of assistance: Complete Independence Comments: antalgic, decreased stance L LE  TODAY'S TREATMENT:                                                                                                                              DATE: 03/15/2023   Therapeutic exercise  Ascending 4 regular steps with B UE assist   R posterior hip pain, decreased lumbopelvic control, increased backward lean and pelvic drop while descending stairs  Seated R hip piriformis stretch 1 minute x 3  Seated hip adduction isometrics small blue physioball squeeze 10x5 seconds for 3 sets  Seated R hip IR 10x5 seconds   L  thigh discomfort  Single leg dead lift with contralateral UE assist   R 5x3   Forward step up onto 4 inch step 5x with L UE assist   SLS with B UE assist   R 10x5 seconds    Low back discomfort  Prone glute max set 10x5 seconds with folded pillow under abdomen.     Improved exercise technique, movement at target joints, use of target muscles after mod verbal, visual, tactile cues.    Manual therapy Prone UPA to L L5 and L4 TP grade 3 to promote mobility   Decreased TTP R L5 TP afterwards   PATIENT EDUCATION:  Education details: there-ex, HEP Person educated: Patient Education method: Explanation, Demonstration, Tactile cues, Verbal cues, and Handouts Education comprehension: verbalized understanding and returned demonstration  HOME EXERCISE PROGRAM: Access Code: [email or web address] URL: [email or web address] Date: 03/02/2023 Prepared by: Loralyn Freshwater  Exercises - Seated Piriformis Stretch  - 3 x daily - 7 x weekly - 1 sets - 3-5 reps - 30 seconds hold - Seated Hip Adduction Isometrics with Ball  - 1 x daily - 7 x weekly - 3 sets - 10 reps - 5 seconds hold - Supine Transversus Abdominis Bracing - Hands on Stomach  - 5 x daily - 7 x weekly - 3 sets - 10 reps - 5 seconds hold - Prone Quadriceps Set  - 1 x daily - 7 x weekly - 3 sets - 10 reps - 5 seconds hold  ASSESSMENT:  CLINICAL IMPRESSION:  Decreased low back and R posterior hip pain after manual therapy to low back to improve mobility. No pain reported after session. Pt will benefit from continued skilled physical therapy services to decrease pain, improve strength and function.         OBJECTIVE IMPAIRMENTS: Abnormal gait, difficulty walking, decreased ROM, decreased strength, improper body mechanics, postural dysfunction, and pain.   ACTIVITY LIMITATIONS: sitting, stairs, and locomotion level  PARTICIPATION LIMITATIONS: occupation  PERSONAL FACTORS: Age, Fitness, Profession, and 3+ comorbidities:  arthritis, chronic back pain, HTN  are also affecting patient's functional outcome.   REHAB POTENTIAL: Fair  CLINICAL DECISION MAKING: Stable/uncomplicated  EVALUATION COMPLEXITY: Low   GOALS: Goals reviewed with patient? Yes  SHORT TERM GOALS: Target date: 03/25/2023   Pt will be independent with her initial HEP to decrease pain, improve strength, function, and ability to ambulate up inclines and stairs more comfortably.  Baseline: Pt has started her initial HEP (03/02/2023) Goal status: INITIAL   LONG TERM GOALS: Target date: 04/01/2023    Pt will have a decrease in R posterior hip pain to 2/10 or less at worst to promote ability to ambulate and negotiate stairs more comfortably.  Baseline: 6/10 at worst for the past 3 weeks (03/02/2023) Goal status: INITIAL  2.  Pt will improve R hip IR PROM at 90/90 position to at least 25 degrees to help decrease pain and improve ability to ambulate and negotiate stairs more comfortably.  Baseline:  Hip internal rotation Right: 12 degrees (stiff end feel)  (03/02/2023) Goal status: INITIAL  3.  Pt will improve her lumbar spine FOTO score by at least 10 points as a demonstration of improved function.   Baseline: Lumbar spine FOTO 66 (03/02/2023) Goal status: INITIAL  4.  Pt will improve her R hip extension and abduction strength by at least 1/2 MMT grade to promote ability to perform standing tasks more comfortably.  Baseline:  MMT Right eval Left eval  Hip flexion 4-  4  Hip extension 4- 3+   Hip abduction 4 4+   (03/02/2023) Goal status: INITIAL   PLAN:  PT FREQUENCY: 2x/week  PT DURATION: 4 weeks  PLANNED INTERVENTIONS: Therapeutic exercises, Therapeutic activity, Neuromuscular re-education, Gait training, Patient/Family education, Joint mobilization, Joint manipulation, Dry Needling, Electrical stimulation, Spinal manipulation, and Spinal mobilization.  PLAN FOR NEXT SESSION: improve R hip IR, posture, trunk and hip  strength, manual techniques, modalities PRN  Loralyn Freshwater PT, DPT  03/15/2023, 12:52 PM

## 2023-03-23 ENCOUNTER — Ambulatory Visit: Payer: PRIVATE HEALTH INSURANCE

## 2023-03-23 DIAGNOSIS — M25551 Pain in right hip: Secondary | ICD-10-CM | POA: Diagnosis not present

## 2023-03-23 DIAGNOSIS — M25651 Stiffness of right hip, not elsewhere classified: Secondary | ICD-10-CM

## 2023-03-23 DIAGNOSIS — M5459 Other low back pain: Secondary | ICD-10-CM

## 2023-03-23 NOTE — Therapy (Signed)
OUTPATIENT PHYSICAL THERAPY  Treatment   Patient Name: Kristy Travis MRN: [id number] DOB:1966/01/11, 57 y.o., female Today's Date: 03/23/2023  END OF SESSION:  PT End of Session - 03/23/23 0951     Visit Number 4    Number of Visits 8    Date for PT Re-Evaluation 04/01/23    Authorization Type Worker's Comp    Authorization - Number of Visits 8    PT Start Time [id number]    PT Stop Time 1030    PT Time Calculation (min) 39 min    Activity Tolerance Patient tolerated treatment well    Behavior During Therapy WFL for tasks assessed/performed                Past Medical History:  Diagnosis Date   Arthritis    Back pain    BACK PAIN, LUMBAR 01/15/2008   Qualifier: Diagnosis of  By: Lodema Hong MD, Margaret     Carpal tunnel syndrome    Chronic fatigue    Constipation    GERD (gastroesophageal reflux disease)    GERD (gastroesophageal reflux disease) 04/06/2017   Glucose intolerance (impaired glucose tolerance) 12/12/2017   Hyperlipidemia    Hypertension    per patient this was prior to weight loss surgery/cn 12/01/17   Joint pain    Kidney stone on right side 12/21/2016   Metabolic syndrome X 09/02/2010   Qualifier: Diagnosis of  By: Lillia Mountain LPN, Brandi     Need for shingles vaccine 12/11/2018   Obesity    Pancreatitis 07/2014   had  gall bladder removed , spent 1 week in hospital   PONV (postoperative nausea and vomiting)    Prediabetes    Primary osteoarthritis of left knee    Seasonal allergies 04/04/2012   Vitamin D deficiency    Past Surgical History:  Procedure Laterality Date   ABDOMINAL HYSTERECTOMY     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   CHOLECYSTECTOMY  07/2014   COLONOSCOPY WITH PROPOFOL N/A 05/01/2021   Procedure: COLONOSCOPY WITH BIOPSY;  Surgeon: Midge Minium, MD;  Location: [facility];  Service: Endoscopy;  Laterality: N/A;   cyst removed from left foot and bone removed from left foot  April and June 2010   DILATION AND  CURETTAGE OF UTERUS     ESOPHAGOGASTRODUODENOSCOPY N/A 03/03/2016   Procedure: ESOPHAGOGASTRODUODENOSCOPY (EGD);  Surgeon: Malissa Hippo, MD;  Location: [facility];  Service: Endoscopy;  Laterality: N/A;  3:00 - moved to 2:00 - Ann to notify pt   ESOPHAGOGASTRODUODENOSCOPY (EGD) WITH PROPOFOL N/A 05/01/2021   Procedure: ESOPHAGOGASTRODUODENOSCOPY (EGD) WITH PROPOFOL;  Surgeon: Midge Minium, MD;  Location: [facility];  Service: Endoscopy;  Laterality: N/A;   KNEE ARTHROSCOPY Right 12/12/2017   Procedure: ARTHROSCOPY KNEE, MEDIAL CHONDROPLASTY, LOOSE BODY REMOVAL,PARTIAL LATERAL MENISCECTOMY;  Surgeon: Donato Heinz, MD;  Location: [facility];  Service: Orthopedics;  Laterality: Right;   KNEE ARTHROSCOPY Left 06/02/2018   Procedure: ARTHROSCOPY KNEE, MEDIAL CHONDROPLASTY, PARTIAL MEDIAL MENISCECTOMY;  Surgeon: Donato Heinz, MD;  Location: [facility];  Service: Orthopedics;  Laterality: Left;   laparoscopy to eval dyspepsia  2004   POLYPECTOMY N/A 05/01/2021   Procedure: POLYPECTOMY;  Surgeon: Midge Minium, MD;  Location: [facility];  Service: Endoscopy;  Laterality: N/A;   TOTAL VAGINAL HYSTERECTOMY  06/2014   with BSO   Patient Active Problem List   Diagnosis Date Noted   Insomnia 03/16/2022   Chronic pain of left ankle 11/24/2021   Baker's cyst  of knee, left 11/24/2021   Overweight (BMI 25.0-29.9) 11/24/2021   Polyphagia 11/09/2021   Hair loss 11/09/2021   Constipation 08/05/2021   Polyp of transverse colon    Chronic foot pain, left 01/22/2021   Prediabetes 01/17/2020   Mass of soft tissue of chest 01/17/2020   Primary osteoarthritis of left knee 12/12/2017   GERD (gastroesophageal reflux disease) 04/06/2017   Chronic fatigue disorder 12/22/2016   Annual physical exam 12/30/2012   Vitamin D deficiency 08/27/2011   Pain in joint, multiple sites 09/02/2010   Hyperlipidemia 02/06/2010   Back pain 01/15/2008    PCP:    Kerri Perches, MD    REFERRING  PROVIDER: Gean Birchwood, MD  REFERRING DIAG: M54.50 (ICD-10-CM) - Low back pain S70.01XA (ICD-10-CM) - Contusion of right hip  Rationale for Evaluation and Treatment: Rehabilitation  THERAPY DIAG:  Pain in right hip  Other low back pain  Stiffness of right hip, not elsewhere classified  ONSET DATE: 01/27/2023  SUBJECTIVE:                                                                                                                                                                                           SUBJECTIVE STATEMENT: Back is alright today. R hip is bothering her (R posterior hip and thigh). Felt like her R leg gave out after last session. 4/10 R posterior hip and no low back pain currently      PERTINENT HISTORY:  Low back and R hip pain secondary to a fall on 01/27/2023. Pt slipped on water, pt fell backwards. R posterior hip around the bone area is bothering her more. Pain is not as bad as it was since the fall. Pt was unable to perform R hip flexion at the time of injury.   Aggravating factors: walking up steps and incline, sitting, walking  Relieving factors: hot bath, heating pad   Pt also has chronic central low back pain which was aggravated from the fall. Denies loss of bowel or bladder function, or LE paresthesia    No blood pressure problems per pt.  No latex allergies  PAIN:  Are you having pain? R posterior hip: 6/10 currently (pt sitting on a chair), 6/10 at most for the past 3 weeks (10+/10 at time of injury)  PRECAUTIONS: None  RED FLAGS: None   WEIGHT BEARING RESTRICTIONS: No  FALLS:  Has patient fallen in last 6 months? Yes. Number of falls 1 with resulting injury needing PT eval.   LIVING ENVIRONMENT:  OCCUPATION: Pt is a housekeeper   PLOF: Independent  PATIENT GOALS: be pain free  NEXT MD VISIT: unsure,  probably 03/23/2023  OBJECTIVE:   DIAGNOSTIC FINDINGS:  CLINICAL DATA:  Hip trauma with fracture suspected. X-ray done.  [employer]. Slip and fall injury.   EXAM: CT OF THE RIGHT HIP WITHOUT CONTRAST   TECHNIQUE: Multidetector CT imaging of the right hip was performed according to the standard protocol. Multiplanar CT image reconstructions were also generated.   RADIATION DOSE REDUCTION: This exam was performed according to the departmental dose-optimization program which includes automated exposure control, adjustment of the mA and/or kV according to patient size and/or use of iterative reconstruction technique.   COMPARISON:  Right hip radiographs 01/27/2023   FINDINGS: Bones/Joint/Cartilage   Degenerative changes in the right hip with joint space narrowing and small osteophyte formation. No evidence of acute fracture or dislocation. Visualized portion of the pelvis appears intact.   Ligaments   Suboptimally assessed by CT.   Muscles and Tendons   No intramuscular mass or collection.   Soft tissues   No abnormal soft tissue infiltration or collection. Visualized intrapelvic structures appear intact.   IMPRESSION: No acute fracture or dislocation demonstrated in the right hip. Mild degenerative changes.     CLINICAL DATA:  Hip pain   EXAM: DG HIP (WITH OR WITHOUT PELVIS) 2-3V RIGHT   COMPARISON:  None Available.   FINDINGS: Pubic symphysis appears intact. SI joints are non widened. No fracture or malalignment. Mild hip degenerative change   IMPRESSION: Mild hip degenerative change.        PATIENT SURVEYS:  Lumbar spine FOTO 66 (03/02/2023)  SCREENING FOR RED FLAGS: Bowel or bladder incontinence: No  Cauda equina syndrome: No   COGNITION: Overall cognitive status: Within functional limits for tasks assessed     SENSATION: WFL    POSTURE: forward neck, thoracic kyphosis, B protracted shoulders, R shoulder lower, L thoracic convexity, decreased lumbar lordosis, R knee higher, R hip in ER  PALPATION: TTP sacrum (superior, and R lateral), TTP R proximal  attachment of piriformis muscle  TTP R L5 > L4 > L3 TP      LUMBAR ROM:   AROM eval  Flexion WFL with R low back pain  Extension WFL  Right lateral flexion WFL with R low back pain  Left lateral flexion WFL with R low back pressure  Right rotation WFL   Left rotation WFL   (Blank rows = not tested)  LOWER EXTREMITY ROM:     PROM Right eval Left eval  Hip flexion    Hip extension    Hip abduction    Hip adduction    Hip internal rotation 12 (stiff end feel) 18 (stiff end feel and posterior hip discomfort)  Hip external rotation    Knee flexion    Knee extension    Ankle dorsiflexion    Ankle plantarflexion    Ankle inversion    Ankle eversion     (Blank rows = not tested)  LOWER EXTREMITY MMT:    MMT Right eval Left eval  Hip flexion 4-  4  Hip extension 4- 3+   Hip abduction 4 4+   Hip adduction    Hip internal rotation    Hip external rotation    Knee flexion 5 4+  Knee extension 5 5  Ankle dorsiflexion    Ankle plantarflexion    Ankle inversion    Ankle eversion     (Blank rows = not tested)  LUMBAR SPECIAL TESTS:  (-) repeated flexion test (+) Slump test R LE with reproduction of R posterior hip  symptoms.    (+) R Piriformis test with reproduction of symptoms   Long sit test suggests anterior nutation of R ASIS.   FUNCTIONAL TESTS:    GAIT: Distance walked: 60 Assistive device utilized: None Level of assistance: Complete Independence Comments: antalgic, decreased stance L LE  TODAY'S TREATMENT:                                                                                                                              DATE: 03/23/2023   Therapeutic exercise  Seated R hip piriformis stretch 1 minute x 3  Seated R hip IR 10x5 seconds, then 2x5 seconds. R posterior thigh burning sensation, eases with rest.   Supine R hip IR stretch with PT 1 minute x 3  Supine with R hip at 90/90 AAROM with PT 10x3  S/L reverse clamshell R  10x3  Reproduction of R posterior thigh and hip symptoms with Slump test on R  R LE neural floss 10x2  Single leg dead lift with contralateral UE assist   R 10x2   Seated hip adduction isometrics small blue physioball squeeze 10x5 seconds for 3 sets      Improved exercise technique, movement at target joints, use of target muscles after mod verbal, visual, tactile cues.      PATIENT EDUCATION:  Education details: there-ex, HEP Person educated: Patient Education method: Explanation, Demonstration, Tactile cues, Verbal cues, and Handouts Education comprehension: verbalized understanding and returned demonstration  HOME EXERCISE PROGRAM: Access Code: [email or web address] URL: [email or web address] Date: 03/02/2023 Prepared by: Loralyn Freshwater  Exercises - Seated Piriformis Stretch  - 3 x daily - 7 x weekly - 1 sets - 3-5 reps - 30 seconds hold - Seated Hip Adduction Isometrics with Ball  - 1 x daily - 7 x weekly - 3 sets - 10 reps - 5 seconds hold - Supine Transversus Abdominis Bracing - Hands on Stomach  - 5 x daily - 7 x weekly - 3 sets - 10 reps - 5 seconds hold - Prone Quadriceps Set  - 1 x daily - 7 x weekly - 3 sets - 10 reps - 5 seconds hold     ASSESSMENT:  CLINICAL IMPRESSION:  Continued working on decreasing R piriformis muscle tension and improving glute max strength to decrease pressure to R sciatic nerve. No pain reported in R posterior hip and thigh after session.  Pt will benefit from continued skilled physical therapy services to decrease pain, improve strength and function.         OBJECTIVE IMPAIRMENTS: Abnormal gait, difficulty walking, decreased ROM, decreased strength, improper body mechanics, postural dysfunction, and pain.   ACTIVITY LIMITATIONS: sitting, stairs, and locomotion level  PARTICIPATION LIMITATIONS: occupation  PERSONAL FACTORS: Age, Fitness, Profession, and 3+ comorbidities: arthritis, chronic back pain, HTN  are also  affecting patient's functional outcome.   REHAB POTENTIAL: Fair    CLINICAL DECISION MAKING: Stable/uncomplicated  EVALUATION COMPLEXITY: Low   GOALS: Goals reviewed with  patient? Yes  SHORT TERM GOALS: Target date: 03/25/2023   Pt will be independent with her initial HEP to decrease pain, improve strength, function, and ability to ambulate up inclines and stairs more comfortably.  Baseline: Pt has started her initial HEP (03/02/2023) Goal status: INITIAL   LONG TERM GOALS: Target date: 04/01/2023    Pt will have a decrease in R posterior hip pain to 2/10 or less at worst to promote ability to ambulate and negotiate stairs more comfortably.  Baseline: 6/10 at worst for the past 3 weeks (03/02/2023) Goal status: INITIAL  2.  Pt will improve R hip IR PROM at 90/90 position to at least 25 degrees to help decrease pain and improve ability to ambulate and negotiate stairs more comfortably.  Baseline:  Hip internal rotation Right: 12 degrees (stiff end feel)  (03/02/2023) Goal status: INITIAL  3.  Pt will improve her lumbar spine FOTO score by at least 10 points as a demonstration of improved function.   Baseline: Lumbar spine FOTO 66 (03/02/2023) Goal status: INITIAL  4.  Pt will improve her R hip extension and abduction strength by at least 1/2 MMT grade to promote ability to perform standing tasks more comfortably.  Baseline:  MMT Right eval Left eval  Hip flexion 4-  4  Hip extension 4- 3+   Hip abduction 4 4+   (03/02/2023) Goal status: INITIAL   PLAN:  PT FREQUENCY: 2x/week  PT DURATION: 4 weeks  PLANNED INTERVENTIONS: Therapeutic exercises, Therapeutic activity, Neuromuscular re-education, Gait training, Patient/Family education, Joint mobilization, Joint manipulation, Dry Needling, Electrical stimulation, Spinal manipulation, and Spinal mobilization.  PLAN FOR NEXT SESSION: improve R hip IR, posture, trunk and hip strength, manual techniques, modalities  PRN  Loralyn Freshwater PT, DPT  03/23/2023, 10:33 AM

## 2023-03-24 ENCOUNTER — Other Ambulatory Visit: Payer: Self-pay

## 2023-03-24 MED ORDER — DICLOFENAC SODIUM 1 % EX GEL
4.0000 g | Freq: Three times a day (TID) | CUTANEOUS | 2 refills | Status: AC
  Filled 2023-03-24: qty 100, 8d supply, fill #0

## 2023-03-25 ENCOUNTER — Ambulatory Visit: Payer: PRIVATE HEALTH INSURANCE

## 2023-03-25 DIAGNOSIS — M25551 Pain in right hip: Secondary | ICD-10-CM | POA: Diagnosis not present

## 2023-03-25 DIAGNOSIS — M5459 Other low back pain: Secondary | ICD-10-CM

## 2023-03-25 DIAGNOSIS — M25651 Stiffness of right hip, not elsewhere classified: Secondary | ICD-10-CM

## 2023-03-25 NOTE — Therapy (Signed)
OUTPATIENT PHYSICAL THERAPY  Treatment   Patient Name: Kristy Travis MRN: [id number] DOB:Apr 07, 1966, 57 y.o., female Today's Date: 03/25/2023  END OF SESSION:  PT End of Session - 03/25/23 0731     Visit Number 5    Number of Visits 8    Date for PT Re-Evaluation 04/01/23    Authorization Type Worker's Comp    Authorization - Number of Visits 8    PT Start Time 0731    PT Stop Time 0815    PT Time Calculation (min) 44 min    Activity Tolerance Patient tolerated treatment well    Behavior During Therapy WFL for tasks assessed/performed                 Past Medical History:  Diagnosis Date   Arthritis    Back pain    BACK PAIN, LUMBAR 01/15/2008   Qualifier: Diagnosis of  By: Lodema Hong MD, Margaret     Carpal tunnel syndrome    Chronic fatigue    Constipation    GERD (gastroesophageal reflux disease)    GERD (gastroesophageal reflux disease) 04/06/2017   Glucose intolerance (impaired glucose tolerance) 12/12/2017   Hyperlipidemia    Hypertension    per patient this was prior to weight loss surgery/cn 12/01/17   Joint pain    Kidney stone on right side 12/21/2016   Metabolic syndrome X 09/02/2010   Qualifier: Diagnosis of  By: Lillia Mountain LPN, Brandi     Need for shingles vaccine 12/11/2018   Obesity    Pancreatitis 07/2014   had  gall bladder removed , spent 1 week in hospital   PONV (postoperative nausea and vomiting)    Prediabetes    Primary osteoarthritis of left knee    Seasonal allergies 04/04/2012   Vitamin D deficiency    Past Surgical History:  Procedure Laterality Date   ABDOMINAL HYSTERECTOMY     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   CHOLECYSTECTOMY  07/2014   COLONOSCOPY WITH PROPOFOL N/A 05/01/2021   Procedure: COLONOSCOPY WITH BIOPSY;  Surgeon: Midge Minium, MD;  Location: [facility];  Service: Endoscopy;  Laterality: N/A;   cyst removed from left foot and bone removed from left foot  April and June 2010   DILATION AND  CURETTAGE OF UTERUS     ESOPHAGOGASTRODUODENOSCOPY N/A 03/03/2016   Procedure: ESOPHAGOGASTRODUODENOSCOPY (EGD);  Surgeon: Malissa Hippo, MD;  Location: [facility];  Service: Endoscopy;  Laterality: N/A;  3:00 - moved to 2:00 - Ann to notify pt   ESOPHAGOGASTRODUODENOSCOPY (EGD) WITH PROPOFOL N/A 05/01/2021   Procedure: ESOPHAGOGASTRODUODENOSCOPY (EGD) WITH PROPOFOL;  Surgeon: Midge Minium, MD;  Location: [facility];  Service: Endoscopy;  Laterality: N/A;   KNEE ARTHROSCOPY Right 12/12/2017   Procedure: ARTHROSCOPY KNEE, MEDIAL CHONDROPLASTY, LOOSE BODY REMOVAL,PARTIAL LATERAL MENISCECTOMY;  Surgeon: Donato Heinz, MD;  Location: [facility];  Service: Orthopedics;  Laterality: Right;   KNEE ARTHROSCOPY Left 06/02/2018   Procedure: ARTHROSCOPY KNEE, MEDIAL CHONDROPLASTY, PARTIAL MEDIAL MENISCECTOMY;  Surgeon: Donato Heinz, MD;  Location: [facility];  Service: Orthopedics;  Laterality: Left;   laparoscopy to eval dyspepsia  2004   POLYPECTOMY N/A 05/01/2021   Procedure: POLYPECTOMY;  Surgeon: Midge Minium, MD;  Location: [facility];  Service: Endoscopy;  Laterality: N/A;   TOTAL VAGINAL HYSTERECTOMY  06/2014   with BSO   Patient Active Problem List   Diagnosis Date Noted   Insomnia 03/16/2022   Chronic pain of left ankle 11/24/2021   Baker's  cyst of knee, left 11/24/2021   Overweight (BMI 25.0-29.9) 11/24/2021   Polyphagia 11/09/2021   Hair loss 11/09/2021   Constipation 08/05/2021   Polyp of transverse colon    Chronic foot pain, left 01/22/2021   Prediabetes 01/17/2020   Mass of soft tissue of chest 01/17/2020   Primary osteoarthritis of left knee 12/12/2017   GERD (gastroesophageal reflux disease) 04/06/2017   Chronic fatigue disorder 12/22/2016   Annual physical exam 12/30/2012   Vitamin D deficiency 08/27/2011   Pain in joint, multiple sites 09/02/2010   Hyperlipidemia 02/06/2010   Back pain 01/15/2008    PCP:    Kerri Perches, MD    REFERRING  PROVIDER: Gean Birchwood, MD  REFERRING DIAG: M54.50 (ICD-10-CM) - Low back pain S70.01XA (ICD-10-CM) - Contusion of right hip  Rationale for Evaluation and Treatment: Rehabilitation  THERAPY DIAG:  Pain in right hip  Other low back pain  Stiffness of right hip, not elsewhere classified  ONSET DATE: 01/27/2023  SUBJECTIVE:                                                                                                                                                                                           SUBJECTIVE STATEMENT: Went to the doctor's yesterday. Dr. Turner Daniels. Appointment went pretty good, still in pain. Was prescribed home TENS unit which is on its way to her home. Would like know how to use it. Low back is alright. R posterior thigh is uncomfortable from driving. 3/10 R posterior hip/ischial tuberosity area pain currently. Not too bad since last session.  Pt fell when pt got out of the car after 03/15/2023 session when leg gave out on her. 4/10 pain at most sine 03/18/2023 (past 7 days). R posterior hip feels irritated after PT sessions.       PERTINENT HISTORY:  Low back and R hip pain secondary to a fall on 01/27/2023. Pt slipped on water, pt fell backwards. R posterior hip around the bone area is bothering her more. Pain is not as bad as it was since the fall. Pt was unable to perform R hip flexion at the time of injury.   Aggravating factors: walking up steps and incline, sitting, walking  Relieving factors: hot bath, heating pad   Pt also has chronic central low back pain which was aggravated from the fall. Denies loss of bowel or bladder function, or LE paresthesia    No blood pressure problems per pt.  No latex allergies  PAIN:  Are you having pain? R posterior hip: 6/10 currently (pt sitting on a chair), 6/10 at most for the past 3 weeks (10+/10  at time of injury)  PRECAUTIONS: None  RED FLAGS: None   WEIGHT BEARING RESTRICTIONS: No  FALLS:  Has patient  fallen in last 6 months? Yes. Number of falls 1 with resulting injury needing PT eval.   LIVING ENVIRONMENT:  OCCUPATION: Pt is a housekeeper   PLOF: Independent  PATIENT GOALS: be pain free  NEXT MD VISIT: unsure, probably 03/23/2023  OBJECTIVE:   DIAGNOSTIC FINDINGS:  CLINICAL DATA:  Hip trauma with fracture suspected. X-ray done. [employer]. Slip and fall injury.   EXAM: CT OF THE RIGHT HIP WITHOUT CONTRAST   TECHNIQUE: Multidetector CT imaging of the right hip was performed according to the standard protocol. Multiplanar CT image reconstructions were also generated.   RADIATION DOSE REDUCTION: This exam was performed according to the departmental dose-optimization program which includes automated exposure control, adjustment of the mA and/or kV according to patient size and/or use of iterative reconstruction technique.   COMPARISON:  Right hip radiographs 01/27/2023   FINDINGS: Bones/Joint/Cartilage   Degenerative changes in the right hip with joint space narrowing and small osteophyte formation. No evidence of acute fracture or dislocation. Visualized portion of the pelvis appears intact.   Ligaments   Suboptimally assessed by CT.   Muscles and Tendons   No intramuscular mass or collection.   Soft tissues   No abnormal soft tissue infiltration or collection. Visualized intrapelvic structures appear intact.   IMPRESSION: No acute fracture or dislocation demonstrated in the right hip. Mild degenerative changes.     CLINICAL DATA:  Hip pain   EXAM: DG HIP (WITH OR WITHOUT PELVIS) 2-3V RIGHT   COMPARISON:  None Available.   FINDINGS: Pubic symphysis appears intact. SI joints are non widened. No fracture or malalignment. Mild hip degenerative change   IMPRESSION: Mild hip degenerative change.        PATIENT SURVEYS:  Lumbar spine FOTO 66 (03/02/2023)  SCREENING FOR RED FLAGS: Bowel or bladder incontinence: No  Cauda equina syndrome:  No   COGNITION: Overall cognitive status: Within functional limits for tasks assessed     SENSATION: WFL    POSTURE: forward neck, thoracic kyphosis, B protracted shoulders, R shoulder lower, L thoracic convexity, decreased lumbar lordosis, R knee higher, R hip in ER  PALPATION: TTP sacrum (superior, and R lateral), TTP R proximal attachment of piriformis muscle  TTP R L5 > L4 > L3 TP      LUMBAR ROM:   AROM eval  Flexion WFL with R low back pain  Extension WFL  Right lateral flexion WFL with R low back pain  Left lateral flexion WFL with R low back pressure  Right rotation WFL   Left rotation WFL   (Blank rows = not tested)  LOWER EXTREMITY ROM:     PROM Right eval Left eval  Hip flexion    Hip extension    Hip abduction    Hip adduction    Hip internal rotation 12 (stiff end feel) 18 (stiff end feel and posterior hip discomfort)  Hip external rotation    Knee flexion    Knee extension    Ankle dorsiflexion    Ankle plantarflexion    Ankle inversion    Ankle eversion     (Blank rows = not tested)  LOWER EXTREMITY MMT:    MMT Right eval Left eval  Hip flexion 4-  4  Hip extension 4- 3+   Hip abduction 4 4+   Hip adduction    Hip internal rotation  Hip external rotation    Knee flexion 5 4+  Knee extension 5 5  Ankle dorsiflexion    Ankle plantarflexion    Ankle inversion    Ankle eversion     (Blank rows = not tested)  LUMBAR SPECIAL TESTS:  (-) repeated flexion test (+) Slump test R LE with reproduction of R posterior hip symptoms.    (+) R Piriformis test with reproduction of symptoms   Long sit test suggests anterior nutation of R ASIS.   FUNCTIONAL TESTS:    GAIT: Distance walked: 60 Assistive device utilized: None Level of assistance: Complete Independence Comments: antalgic, decreased stance L LE  TODAY'S TREATMENT:                                                                                                                               DATE: 03/25/2023   Therapeutic exercise  Palpation: TTP R posterior greater trochanter, R ischial tuberosity, R femur/lateral hamstrings area  Reproduction of R posterior thigh pain with eccentric loading to R lateral hamstrings.   Prone with pillow under abdomen Prone knee flexion with glute max squeeze  R 10x3  Prone hip extension   R 5x3  Bridge 5x3  Pain free range of motion  Log roll for supine <> sit 2x for back comfort   Seated L neural floss 10x3   Pt to bring her home TENS unit next visit to go over use.    Improved exercise technique, movement at target joints, use of target muscles after mod verbal, visual, tactile cues.      PATIENT EDUCATION:  Education details: there-ex, HEP Person educated: Patient Education method: Explanation, Demonstration, Tactile cues, Verbal cues, and Handouts Education comprehension: verbalized understanding and returned demonstration  HOME EXERCISE PROGRAM: Access Code: [email or web address] URL: [email or web address] Date: 03/02/2023 Prepared by: Loralyn Freshwater  Exercises - Seated Piriformis Stretch  - 3 x daily - 7 x weekly - 1 sets - 3-5 reps - 30 seconds hold - Seated Hip Adduction Isometrics with Ball  - 1 x daily - 7 x weekly - 3 sets - 10 reps - 5 seconds hold - Supine Transversus Abdominis Bracing - Hands on Stomach  - 5 x daily - 7 x weekly - 3 sets - 10 reps - 5 seconds hold - Prone Quadriceps Set  - 1 x daily - 7 x weekly - 3 sets - 10 reps - 5 seconds hold  - Supine Bridge  - 1 x daily - 7 x weekly - 3 sets - 5 reps   ASSESSMENT:  CLINICAL IMPRESSION:  Upon re-evaluation, signs and symptoms also suggest R lateral hamstrings and distal piriformis involvement. Worked on gentle loading to help promote healing. Fair tolerance to today's session. Pt will benefit from continued skilled physical therapy services to decrease pain, improve strength and function.         OBJECTIVE  IMPAIRMENTS: Abnormal gait, difficulty walking, decreased ROM, decreased strength, improper body  mechanics, postural dysfunction, and pain.   ACTIVITY LIMITATIONS: sitting, stairs, and locomotion level  PARTICIPATION LIMITATIONS: occupation  PERSONAL FACTORS: Age, Fitness, Profession, and 3+ comorbidities: arthritis, chronic back pain, HTN  are also affecting patient's functional outcome.   REHAB POTENTIAL: Fair    CLINICAL DECISION MAKING: Stable/uncomplicated  EVALUATION COMPLEXITY: Low   GOALS: Goals reviewed with patient? Yes  SHORT TERM GOALS: Target date: 03/25/2023   Pt will be independent with her initial HEP to decrease pain, improve strength, function, and ability to ambulate up inclines and stairs more comfortably.  Baseline: Pt has started her initial HEP (03/02/2023) Goal status: INITIAL   LONG TERM GOALS: Target date: 04/01/2023    Pt will have a decrease in R posterior hip pain to 2/10 or less at worst to promote ability to ambulate and negotiate stairs more comfortably.  Baseline: 6/10 at worst for the past 3 weeks (03/02/2023) Goal status: INITIAL  2.  Pt will improve R hip IR PROM at 90/90 position to at least 25 degrees to help decrease pain and improve ability to ambulate and negotiate stairs more comfortably.  Baseline:  Hip internal rotation Right: 12 degrees (stiff end feel)  (03/02/2023) Goal status: INITIAL  3.  Pt will improve her lumbar spine FOTO score by at least 10 points as a demonstration of improved function.   Baseline: Lumbar spine FOTO 66 (03/02/2023) Goal status: INITIAL  4.  Pt will improve her R hip extension and abduction strength by at least 1/2 MMT grade to promote ability to perform standing tasks more comfortably.  Baseline:  MMT Right eval Left eval  Hip flexion 4-  4  Hip extension 4- 3+   Hip abduction 4 4+   (03/02/2023) Goal status: INITIAL   PLAN:  PT FREQUENCY: 2x/week  PT DURATION: 4 weeks  PLANNED  INTERVENTIONS: Therapeutic exercises, Therapeutic activity, Neuromuscular re-education, Gait training, Patient/Family education, Joint mobilization, Joint manipulation, Dry Needling, Electrical stimulation, Spinal manipulation, and Spinal mobilization.  PLAN FOR NEXT SESSION: improve R hip IR, posture, trunk and hip strength, manual techniques, modalities PRN  Loralyn Freshwater PT, DPT  03/25/2023, 10:25 AM

## 2023-03-30 ENCOUNTER — Ambulatory Visit: Payer: PRIVATE HEALTH INSURANCE

## 2023-03-30 ENCOUNTER — Other Ambulatory Visit: Payer: Self-pay

## 2023-03-30 DIAGNOSIS — M25651 Stiffness of right hip, not elsewhere classified: Secondary | ICD-10-CM

## 2023-03-30 DIAGNOSIS — M25551 Pain in right hip: Secondary | ICD-10-CM | POA: Diagnosis not present

## 2023-03-30 DIAGNOSIS — M5459 Other low back pain: Secondary | ICD-10-CM

## 2023-03-30 NOTE — Therapy (Signed)
OUTPATIENT PHYSICAL THERAPY  Treatment   Patient Name: Kristy Travis MRN: [id number] DOB:Nov 19, 1965, 57 y.o., female Today's Date: 03/30/2023  END OF SESSION:  PT End of Session - 03/30/23 0947     Visit Number 6    Number of Visits 8    Date for PT Re-Evaluation 04/01/23    Authorization Type Worker's Comp    Authorization - Number of Visits 8    PT Start Time [phone]    PT Stop Time 1025    PT Time Calculation (min) 38 min    Activity Tolerance Patient tolerated treatment well    Behavior During Therapy WFL for tasks assessed/performed                  Past Medical History:  Diagnosis Date   Arthritis    Back pain    BACK PAIN, LUMBAR 01/15/2008   Qualifier: Diagnosis of  By: Lodema Hong MD, Margaret     Carpal tunnel syndrome    Chronic fatigue    Constipation    GERD (gastroesophageal reflux disease)    GERD (gastroesophageal reflux disease) 04/06/2017   Glucose intolerance (impaired glucose tolerance) 12/12/2017   Hyperlipidemia    Hypertension    per patient this was prior to weight loss surgery/cn 12/01/17   Joint pain    Kidney stone on right side 12/21/2016   Metabolic syndrome X 09/02/2010   Qualifier: Diagnosis of  By: Lillia Mountain LPN, Brandi     Need for shingles vaccine 12/11/2018   Obesity    Pancreatitis 07/2014   had  gall bladder removed , spent 1 week in hospital   PONV (postoperative nausea and vomiting)    Prediabetes    Primary osteoarthritis of left knee    Seasonal allergies 04/04/2012   Vitamin D deficiency    Past Surgical History:  Procedure Laterality Date   ABDOMINAL HYSTERECTOMY     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   CHOLECYSTECTOMY  07/2014   COLONOSCOPY WITH PROPOFOL N/A 05/01/2021   Procedure: COLONOSCOPY WITH BIOPSY;  Surgeon: Midge Minium, MD;  Location: [facility];  Service: Endoscopy;  Laterality: N/A;   cyst removed from left foot and bone removed from left foot  April and June 2010   DILATION AND  CURETTAGE OF UTERUS     ESOPHAGOGASTRODUODENOSCOPY N/A 03/03/2016   Procedure: ESOPHAGOGASTRODUODENOSCOPY (EGD);  Surgeon: Malissa Hippo, MD;  Location: [facility];  Service: Endoscopy;  Laterality: N/A;  3:00 - moved to 2:00 - Ann to notify pt   ESOPHAGOGASTRODUODENOSCOPY (EGD) WITH PROPOFOL N/A 05/01/2021   Procedure: ESOPHAGOGASTRODUODENOSCOPY (EGD) WITH PROPOFOL;  Surgeon: Midge Minium, MD;  Location: [facility];  Service: Endoscopy;  Laterality: N/A;   KNEE ARTHROSCOPY Right 12/12/2017   Procedure: ARTHROSCOPY KNEE, MEDIAL CHONDROPLASTY, LOOSE BODY REMOVAL,PARTIAL LATERAL MENISCECTOMY;  Surgeon: Donato Heinz, MD;  Location: [facility];  Service: Orthopedics;  Laterality: Right;   KNEE ARTHROSCOPY Left 06/02/2018   Procedure: ARTHROSCOPY KNEE, MEDIAL CHONDROPLASTY, PARTIAL MEDIAL MENISCECTOMY;  Surgeon: Donato Heinz, MD;  Location: [facility];  Service: Orthopedics;  Laterality: Left;   laparoscopy to eval dyspepsia  2004   POLYPECTOMY N/A 05/01/2021   Procedure: POLYPECTOMY;  Surgeon: Midge Minium, MD;  Location: [facility];  Service: Endoscopy;  Laterality: N/A;   TOTAL VAGINAL HYSTERECTOMY  06/2014   with BSO   Patient Active Problem List   Diagnosis Date Noted   Insomnia 03/16/2022   Chronic pain of left ankle 11/24/2021  Baker's cyst of knee, left 11/24/2021   Overweight (BMI 25.0-29.9) 11/24/2021   Polyphagia 11/09/2021   Hair loss 11/09/2021   Constipation 08/05/2021   Polyp of transverse colon    Chronic foot pain, left 01/22/2021   Prediabetes 01/17/2020   Mass of soft tissue of chest 01/17/2020   Primary osteoarthritis of left knee 12/12/2017   GERD (gastroesophageal reflux disease) 04/06/2017   Chronic fatigue disorder 12/22/2016   Annual physical exam 12/30/2012   Vitamin D deficiency 08/27/2011   Pain in joint, multiple sites 09/02/2010   Hyperlipidemia 02/06/2010   Back pain 01/15/2008    PCP:    Kerri Perches, MD    REFERRING  PROVIDER: Gean Birchwood, MD  REFERRING DIAG: M54.50 (ICD-10-CM) - Low back pain S70.01XA (ICD-10-CM) - Contusion of right hip  Rationale for Evaluation and Treatment: Rehabilitation  THERAPY DIAG:  Pain in right hip  Other low back pain  Stiffness of right hip, not elsewhere classified  ONSET DATE: 01/27/2023  SUBJECTIVE:                                                                                                                                                                                           SUBJECTIVE STATEMENT: Feels better, 2/10 low back pain currently. The last session helped. Felt a really big difference the following day.       PERTINENT HISTORY:  Low back and R hip pain secondary to a fall on 01/27/2023. Pt slipped on water, pt fell backwards. R posterior hip around the bone area is bothering her more. Pain is not as bad as it was since the fall. Pt was unable to perform R hip flexion at the time of injury.   Aggravating factors: walking up steps and incline, sitting, walking  Relieving factors: hot bath, heating pad   Pt also has chronic central low back pain which was aggravated from the fall. Denies loss of bowel or bladder function, or LE paresthesia    No blood pressure problems per pt.  No latex allergies  PAIN:  Are you having pain? R posterior hip: 6/10 currently (pt sitting on a chair), 6/10 at most for the past 3 weeks (10+/10 at time of injury)  PRECAUTIONS: None  RED FLAGS: None   WEIGHT BEARING RESTRICTIONS: No  FALLS:  Has patient fallen in last 6 months? Yes. Number of falls 1 with resulting injury needing PT eval.   LIVING ENVIRONMENT:  OCCUPATION: Pt is a housekeeper   PLOF: Independent  PATIENT GOALS: be pain free  NEXT MD VISIT: unsure, probably 03/23/2023  OBJECTIVE:   DIAGNOSTIC FINDINGS:  CLINICAL DATA:  Hip trauma with fracture suspected. X-ray done. [employer]. Slip and fall injury.   EXAM: CT OF THE RIGHT  HIP WITHOUT CONTRAST   TECHNIQUE: Multidetector CT imaging of the right hip was performed according to the standard protocol. Multiplanar CT image reconstructions were also generated.   RADIATION DOSE REDUCTION: This exam was performed according to the departmental dose-optimization program which includes automated exposure control, adjustment of the mA and/or kV according to patient size and/or use of iterative reconstruction technique.   COMPARISON:  Right hip radiographs 01/27/2023   FINDINGS: Bones/Joint/Cartilage   Degenerative changes in the right hip with joint space narrowing and small osteophyte formation. No evidence of acute fracture or dislocation. Visualized portion of the pelvis appears intact.   Ligaments   Suboptimally assessed by CT.   Muscles and Tendons   No intramuscular mass or collection.   Soft tissues   No abnormal soft tissue infiltration or collection. Visualized intrapelvic structures appear intact.   IMPRESSION: No acute fracture or dislocation demonstrated in the right hip. Mild degenerative changes.     CLINICAL DATA:  Hip pain   EXAM: DG HIP (WITH OR WITHOUT PELVIS) 2-3V RIGHT   COMPARISON:  None Available.   FINDINGS: Pubic symphysis appears intact. SI joints are non widened. No fracture or malalignment. Mild hip degenerative change   IMPRESSION: Mild hip degenerative change.        PATIENT SURVEYS:  Lumbar spine FOTO 66 (03/02/2023)  SCREENING FOR RED FLAGS: Bowel or bladder incontinence: No  Cauda equina syndrome: No   COGNITION: Overall cognitive status: Within functional limits for tasks assessed     SENSATION: WFL    POSTURE: forward neck, thoracic kyphosis, B protracted shoulders, R shoulder lower, L thoracic convexity, decreased lumbar lordosis, R knee higher, R hip in ER  PALPATION: TTP sacrum (superior, and R lateral), TTP R proximal attachment of piriformis muscle  TTP R L5 > L4 > L3  TP      LUMBAR ROM:   AROM eval  Flexion WFL with R low back pain  Extension WFL  Right lateral flexion WFL with R low back pain  Left lateral flexion WFL with R low back pressure  Right rotation WFL   Left rotation WFL   (Blank rows = not tested)  LOWER EXTREMITY ROM:     PROM Right eval Left eval  Hip flexion    Hip extension    Hip abduction    Hip adduction    Hip internal rotation 12 (stiff end feel) 18 (stiff end feel and posterior hip discomfort)  Hip external rotation    Knee flexion    Knee extension    Ankle dorsiflexion    Ankle plantarflexion    Ankle inversion    Ankle eversion     (Blank rows = not tested)  LOWER EXTREMITY MMT:    MMT Right eval Left eval  Hip flexion 4-  4  Hip extension 4- 3+   Hip abduction 4 4+   Hip adduction    Hip internal rotation    Hip external rotation    Knee flexion 5 4+  Knee extension 5 5  Ankle dorsiflexion    Ankle plantarflexion    Ankle inversion    Ankle eversion     (Blank rows = not tested)  LUMBAR SPECIAL TESTS:  (-) repeated flexion test (+) Slump test R LE with reproduction of R posterior hip symptoms.    (+) R Piriformis test with reproduction of symptoms  Long sit test suggests anterior nutation of R ASIS.   FUNCTIONAL TESTS:    GAIT: Distance walked: 60 Assistive device utilized: None Level of assistance: Complete Independence Comments: antalgic, decreased stance L LE  TODAY'S TREATMENT:                                                                                                                              DATE: 03/30/2023    Electrical stimulation: 10 (attended due to pt instruction) Pt brought in her Ultima 5 home TENS unit from her MD. Reviewed how to use her unit with her, about 10 minutes. Pt then used it x 15 minutes on constant during exercises to low back at channel 1 at pt desired level of stimulation. Pt states the stimulation feeling good for her back.     Therapeutic exercise   Prone with 2 pillows under abdomen:  Prone knee flexion with glute max squeeze  R 10x3  Prone hip extension   R 10x2  Bridge 5x4  Pain free range of motion  Log roll for supine <> sit 2x for back comfort  Seated R neural floss 10x3  Standing single leg dead lift with contralateral UE assist   R 10x3  Static standing mini lunge with contralateral UE assist   R 10x    Improved exercise technique, movement at target joints, use of target muscles after mod verbal, visual, tactile cues.      PATIENT EDUCATION:  Education details: there-ex, HEP Person educated: Patient Education method: Explanation, Demonstration, Tactile cues, Verbal cues, and Handouts Education comprehension: verbalized understanding and returned demonstration  HOME EXERCISE PROGRAM: Access Code: [email or web address] URL: [email or web address] Date: 03/02/2023 Prepared by: Loralyn Freshwater  Exercises - Seated Piriformis Stretch  - 3 x daily - 7 x weekly - 1 sets - 3-5 reps - 30 seconds hold - Seated Hip Adduction Isometrics with Ball  - 1 x daily - 7 x weekly - 3 sets - 10 reps - 5 seconds hold - Supine Transversus Abdominis Bracing - Hands on Stomach  - 5 x daily - 7 x weekly - 3 sets - 10 reps - 5 seconds hold - Prone Quadriceps Set  - 1 x daily - 7 x weekly - 3 sets - 10 reps - 5 seconds hold  - Supine Bridge  - 1 x daily - 7 x weekly - 3 sets - 5 reps   ASSESSMENT:  CLINICAL IMPRESSION:  Continued working on gentle loading of hamstrings to help promote healing. Back felt good after session reported. Pt education ofn how to use her home TENS unit. Pt demonstrated and verbalized understanding.  Pt will benefit from continued skilled physical therapy services to decrease pain, improve strength and function.         OBJECTIVE IMPAIRMENTS: Abnormal gait, difficulty walking, decreased ROM, decreased strength, improper body mechanics, postural dysfunction, and pain.    ACTIVITY LIMITATIONS: sitting, stairs, and locomotion level  PARTICIPATION LIMITATIONS: occupation  PERSONAL FACTORS: Age, Fitness, Profession, and 3+ comorbidities: arthritis, chronic back pain, HTN  are also affecting patient's functional outcome.   REHAB POTENTIAL: Fair    CLINICAL DECISION MAKING: Stable/uncomplicated  EVALUATION COMPLEXITY: Low   GOALS: Goals reviewed with patient? Yes  SHORT TERM GOALS: Target date: 03/25/2023   Pt will be independent with her initial HEP to decrease pain, improve strength, function, and ability to ambulate up inclines and stairs more comfortably.  Baseline: Pt has started her initial HEP (03/02/2023) Goal status: INITIAL   LONG TERM GOALS: Target date: 04/01/2023    Pt will have a decrease in R posterior hip pain to 2/10 or less at worst to promote ability to ambulate and negotiate stairs more comfortably.  Baseline: 6/10 at worst for the past 3 weeks (03/02/2023) Goal status: INITIAL  2.  Pt will improve R hip IR PROM at 90/90 position to at least 25 degrees to help decrease pain and improve ability to ambulate and negotiate stairs more comfortably.  Baseline:  Hip internal rotation Right: 12 degrees (stiff end feel)  (03/02/2023) Goal status: INITIAL  3.  Pt will improve her lumbar spine FOTO score by at least 10 points as a demonstration of improved function.   Baseline: Lumbar spine FOTO 66 (03/02/2023) Goal status: INITIAL  4.  Pt will improve her R hip extension and abduction strength by at least 1/2 MMT grade to promote ability to perform standing tasks more comfortably.  Baseline:  MMT Right eval Left eval  Hip flexion 4-  4  Hip extension 4- 3+   Hip abduction 4 4+   (03/02/2023) Goal status: INITIAL   PLAN:  PT FREQUENCY: 2x/week  PT DURATION: 4 weeks  PLANNED INTERVENTIONS: Therapeutic exercises, Therapeutic activity, Neuromuscular re-education, Gait training, Patient/Family education, Joint mobilization,  Joint manipulation, Dry Needling, Electrical stimulation, Spinal manipulation, and Spinal mobilization.  PLAN FOR NEXT SESSION: improve R hip IR, posture, trunk and hip strength, manual techniques, modalities PRN  Loralyn Freshwater PT, DPT  03/30/2023, 10:35 AM

## 2023-04-02 ENCOUNTER — Other Ambulatory Visit: Payer: Self-pay | Admitting: Family Medicine

## 2023-04-04 ENCOUNTER — Other Ambulatory Visit: Payer: Self-pay

## 2023-04-04 ENCOUNTER — Ambulatory Visit: Payer: Commercial Managed Care - PPO | Attending: Orthopedic Surgery

## 2023-04-04 DIAGNOSIS — M25651 Stiffness of right hip, not elsewhere classified: Secondary | ICD-10-CM | POA: Diagnosis not present

## 2023-04-04 DIAGNOSIS — M5459 Other low back pain: Secondary | ICD-10-CM | POA: Diagnosis not present

## 2023-04-04 DIAGNOSIS — M25551 Pain in right hip: Secondary | ICD-10-CM | POA: Diagnosis not present

## 2023-04-04 MED ORDER — ESTROGENS CONJUGATED 0.625 MG/GM VA CREA
1.0000 | TOPICAL_CREAM | Freq: Every day | VAGINAL | 0 refills | Status: DC
  Filled 2023-04-04: qty 30, 30d supply, fill #0

## 2023-04-04 NOTE — Therapy (Signed)
OUTPATIENT PHYSICAL THERAPY TREATMENT    Patient Name: Kristy Travis MRN: [id number] DOB:Feb 04, 1966, 57 y.o., female Today's Date: 04/04/2023  END OF SESSION:  PT End of Session - 04/04/23 0949     Visit Number 7    Number of Visits 8    Date for PT Re-Evaluation 04/01/23    Authorization Type Worker's Comp    PT Start Time 0945    PT Stop Time 1025    PT Time Calculation (min) 40 min    Activity Tolerance Patient tolerated treatment well;No increased pain    Behavior During Therapy [facility] for tasks assessed/performed                  Past Medical History:  Diagnosis Date   Arthritis    Back pain    BACK PAIN, LUMBAR 01/15/2008   Qualifier: Diagnosis of  By: Lodema Hong MD, Margaret     Carpal tunnel syndrome    Chronic fatigue    Constipation    GERD (gastroesophageal reflux disease)    GERD (gastroesophageal reflux disease) 04/06/2017   Glucose intolerance (impaired glucose tolerance) 12/12/2017   Hyperlipidemia    Hypertension    per patient this was prior to weight loss surgery/cn 12/01/17   Joint pain    Kidney stone on right side 12/21/2016   Metabolic syndrome X 09/02/2010   Qualifier: Diagnosis of  By: Lillia Mountain LPN, Brandi     Need for shingles vaccine 12/11/2018   Obesity    Pancreatitis 07/2014   had  gall bladder removed , spent 1 week in hospital   PONV (postoperative nausea and vomiting)    Prediabetes    Primary osteoarthritis of left knee    Seasonal allergies 04/04/2012   Vitamin D deficiency    Past Surgical History:  Procedure Laterality Date   ABDOMINAL HYSTERECTOMY     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   CHOLECYSTECTOMY  07/2014   COLONOSCOPY WITH PROPOFOL N/A 05/01/2021   Procedure: COLONOSCOPY WITH BIOPSY;  Surgeon: Midge Minium, MD;  Location: [facility];  Service: Endoscopy;  Laterality: N/A;   cyst removed from left foot and bone removed from left foot  April and June 2010   DILATION AND CURETTAGE OF UTERUS      ESOPHAGOGASTRODUODENOSCOPY N/A 03/03/2016   Procedure: ESOPHAGOGASTRODUODENOSCOPY (EGD);  Surgeon: Malissa Hippo, MD;  Location: [facility];  Service: Endoscopy;  Laterality: N/A;  3:00 - moved to 2:00 - Ann to notify pt   ESOPHAGOGASTRODUODENOSCOPY (EGD) WITH PROPOFOL N/A 05/01/2021   Procedure: ESOPHAGOGASTRODUODENOSCOPY (EGD) WITH PROPOFOL;  Surgeon: Midge Minium, MD;  Location: [facility];  Service: Endoscopy;  Laterality: N/A;   KNEE ARTHROSCOPY Right 12/12/2017   Procedure: ARTHROSCOPY KNEE, MEDIAL CHONDROPLASTY, LOOSE BODY REMOVAL,PARTIAL LATERAL MENISCECTOMY;  Surgeon: Donato Heinz, MD;  Location: [facility];  Service: Orthopedics;  Laterality: Right;   KNEE ARTHROSCOPY Left 06/02/2018   Procedure: ARTHROSCOPY KNEE, MEDIAL CHONDROPLASTY, PARTIAL MEDIAL MENISCECTOMY;  Surgeon: Donato Heinz, MD;  Location: [facility];  Service: Orthopedics;  Laterality: Left;   laparoscopy to eval dyspepsia  2004   POLYPECTOMY N/A 05/01/2021   Procedure: POLYPECTOMY;  Surgeon: Midge Minium, MD;  Location: [facility];  Service: Endoscopy;  Laterality: N/A;   TOTAL VAGINAL HYSTERECTOMY  06/2014   with BSO   Patient Active Problem List   Diagnosis Date Noted   Insomnia 03/16/2022   Chronic pain of left ankle 11/24/2021   Baker's cyst of knee, left 11/24/2021  Overweight (BMI 25.0-29.9) 11/24/2021   Polyphagia 11/09/2021   Hair loss 11/09/2021   Constipation 08/05/2021   Polyp of transverse colon    Chronic foot pain, left 01/22/2021   Prediabetes 01/17/2020   Mass of soft tissue of chest 01/17/2020   Primary osteoarthritis of left knee 12/12/2017   GERD (gastroesophageal reflux disease) 04/06/2017   Chronic fatigue disorder 12/22/2016   Annual physical exam 12/30/2012   Vitamin D deficiency 08/27/2011   Pain in joint, multiple sites 09/02/2010   Hyperlipidemia 02/06/2010   Back pain 01/15/2008    PCP:    Kerri Perches, MD    REFERRING PROVIDER: Gean Birchwood, MD  REFERRING DIAG: M54.50 (ICD-10-CM) - Low back pain S70.01XA (ICD-10-CM) - Contusion of right hip  Rationale for Evaluation and Treatment: Rehabilitation  THERAPY DIAG:  Pain in right hip  Other low back pain  Stiffness of right hip, not elsewhere classified  ONSET DATE: 01/27/2023  SUBJECTIVE:                                                                                                                                                                                           SUBJECTIVE STATEMENT: Pt doing ok. Pain os around 5/10. Pt reports TENS not particularly helpful.    PERTINENT HISTORY:  Low back and R hip pain secondary to a fall on 01/27/2023. Pt slipped on water, pt fell backwards. R posterior hip around the bone area is bothering her more. Pain is not as bad as it was since the fall. Pt was unable to perform R hip flexion at the time of injury. Aggravating factors: walking up steps and incline, sitting, walking. Relieving factors: hot bath, heating pad. Pt also has chronic central low back pain which was aggravated from the fall. Denies loss of bowel or bladder function, or LE paresthesia.   No latex allergies  PAIN:  Are you having pain? Rt posterior hip, distribution of right gluteus maximus 5/10  PRECAUTIONS: None  RED FLAGS: None   WEIGHT BEARING RESTRICTIONS: No  FALLS:  Has patient fallen in last 6 months? Yes. Number of falls 1 with resulting injury needing PT eval.   LIVING ENVIRONMENT:  OCCUPATION: Pt is a housekeeper   PLOF: Independent  PATIENT GOALS: be pain free  NEXT MD VISIT: 04/12/2023  OBJECTIVE:    TODAY'S TREATMENT:  DATE: 03/30/2023  -Hooklying bridge, pain free range 4x5 -FABER stretch in hooklying 2x40sec bilat -Hooklying bridge, ball squeeze at knees, pain free range 2x5 -reverse curlup with ball  between knees 2x5   Standing hip exercises with 3lb AW  -hip ABDCT 1x10 bilat  -hip flexion 1x10 bilat  -hip extension 1x10 bilat   -seated deadlift c blue ball for movement learning x10  -standing hamstrings curl 3lb AW 1x15 -seated deadlift c 10lb KB  -standing hamstrings curl 3lb AW 1x15 -seated deadlift c 10lb KB  -Hooklying lower trunk rotations x20 -heat applied to back  -Hooklying lower trunk rotations x20   Improved exercise technique, movement at target joints, use of target muscles after mod verbal, visual, tactile cues.    PATIENT EDUCATION:  Education details: there-ex, HEP Person educated: Patient Education method: Explanation, Demonstration, Tactile cues, Verbal cues, and Handouts Education comprehension: verbalized understanding and returned demonstration  HOME EXERCISE PROGRAM: Access Code: [email or web address] URL: [email or web address] Date: 03/02/2023 Prepared by: Loralyn Freshwater  Exercises - Seated Piriformis Stretch  - 3 x daily - 7 x weekly - 1 sets - 3-5 reps - 30 seconds hold - Seated Hip Adduction Isometrics with Ball  - 1 x daily - 7 x weekly - 3 sets - 10 reps - 5 seconds hold - Supine Transversus Abdominis Bracing - Hands on Stomach  - 5 x daily - 7 x weekly - 3 sets - 10 reps - 5 seconds hold - Prone Quadriceps Set  - 1 x daily - 7 x weekly - 3 sets - 10 reps - 5 seconds hold  - Supine Bridge  - 1 x daily - 7 x weekly - 3 sets - 5 reps   ASSESSMENT:  CLINICAL IMPRESSION:  Continued current program with progression as tolerated. Advanced table based loading to weightbearing today. Pt has excellent reduction in pain from arrival to exit. Low back remains hypomobile and stiff feeling today, but good response to heat application. Pt will benefit from continued skilled physical therapy services to decrease pain, improve strength and function.     OBJECTIVE IMPAIRMENTS: Abnormal gait, difficulty walking, decreased ROM, decreased strength,  improper body mechanics, postural dysfunction, and pain.   ACTIVITY LIMITATIONS: sitting, stairs, and locomotion level  PARTICIPATION LIMITATIONS: occupation  PERSONAL FACTORS: Age, Fitness, Profession, and 3+ comorbidities: arthritis, chronic back pain, HTN  are also affecting patient's functional outcome.   REHAB POTENTIAL: Fair    CLINICAL DECISION MAKING: Stable/uncomplicated  EVALUATION COMPLEXITY: Low   GOALS: Goals reviewed with patient? Yes  SHORT TERM GOALS: Target date: 03/25/2023   Pt will be independent with her initial HEP to decrease pain, improve strength, function, and ability to ambulate up inclines and stairs more comfortably.  Baseline: Pt has started her initial HEP (03/02/2023) Goal status: INITIAL   LONG TERM GOALS: Target date: 04/01/2023   Pt will have a decrease in R posterior hip pain to 2/10 or less at worst to promote ability to ambulate and negotiate stairs more comfortably.  Baseline: 6/10 at worst for the past 3 weeks (03/02/2023) Goal status: INITIAL  2.  Pt will improve R hip IR PROM at 90/90 position to at least 25 degrees to help decrease pain and improve ability to ambulate and negotiate stairs more comfortably.  Baseline:  Hip internal rotation Right: 12 degrees (stiff end feel)  (03/02/2023) Goal status: INITIAL  3.  Pt will improve her lumbar spine FOTO score by at least 10 points as a demonstration of improved function.  Baseline: Lumbar spine FOTO 66 (03/02/2023) Goal status: INITIAL  4.  Pt will improve her R hip extension and abduction strength by at least 1/2 MMT grade to promote ability to perform standing tasks more comfortably.  Baseline:  MMT Right eval Left eval  Hip flexion 4-  4  Hip extension 4- 3+   Hip abduction 4 4+   (03/02/2023) Goal status: INITIAL   PLAN:  PT FREQUENCY: 2x/week  PT DURATION: 4 weeks  PLANNED INTERVENTIONS: Therapeutic exercises, Therapeutic activity, Neuromuscular re-education, Gait  training, Patient/Family education, Joint mobilization, Joint manipulation, Dry Needling, Electrical stimulation, Spinal manipulation, and Spinal mobilization.  PLAN FOR NEXT SESSION: improve R hip IR, posture, trunk and hip strength, manual techniques, modalities PRN  9:52 AM, 04/04/23 Rosamaria Lints, PT, DPT Physical Therapist - [place] [phone] (Office)   04/04/2023, 9:50 AM

## 2023-04-06 ENCOUNTER — Ambulatory Visit: Payer: PRIVATE HEALTH INSURANCE

## 2023-04-06 DIAGNOSIS — M25651 Stiffness of right hip, not elsewhere classified: Secondary | ICD-10-CM

## 2023-04-06 DIAGNOSIS — M25551 Pain in right hip: Secondary | ICD-10-CM | POA: Diagnosis not present

## 2023-04-06 DIAGNOSIS — M5459 Other low back pain: Secondary | ICD-10-CM

## 2023-04-06 NOTE — Therapy (Signed)
OUTPATIENT PHYSICAL THERAPY  Treatment   Patient Name: Kristy Travis MRN: [id number] DOB:[birth year], 57 y.o., female Today's Date: 04/06/2023  END OF SESSION:  PT End of Session - 04/06/23 0951     Visit Number 8    Number of Visits 24    Date for PT Re-Evaluation 06/03/23    Authorization Type Worker's Comp    PT Start Time [phone]    PT Stop Time 1032    PT Time Calculation (min) 41 min    Activity Tolerance Patient tolerated treatment well;No increased pain    Behavior During Therapy [facility] for tasks assessed/performed                   Past Medical History:  Diagnosis Date   Arthritis    Back pain    BACK PAIN, LUMBAR 01/15/2008   Qualifier: Diagnosis of  By: Lodema Hong MD, Margaret     Carpal tunnel syndrome    Chronic fatigue    Constipation    GERD (gastroesophageal reflux disease)    GERD (gastroesophageal reflux disease) 04/06/2017   Glucose intolerance (impaired glucose tolerance) 12/12/2017   Hyperlipidemia    Hypertension    per patient this was prior to weight loss surgery/cn 12/01/17   Joint pain    Kidney stone on right side 12/21/2016   Metabolic syndrome X 09/02/2010   Qualifier: Diagnosis of  By: Lillia Mountain LPN, Brandi     Need for shingles vaccine 12/11/2018   Obesity    Pancreatitis 07/2014   had  gall bladder removed , spent 1 week in hospital   PONV (postoperative nausea and vomiting)    Prediabetes    Primary osteoarthritis of left knee    Seasonal allergies 04/04/2012   Vitamin D deficiency    Past Surgical History:  Procedure Laterality Date   ABDOMINAL HYSTERECTOMY     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   CHOLECYSTECTOMY  07/2014   COLONOSCOPY WITH PROPOFOL N/A 05/01/2021   Procedure: COLONOSCOPY WITH BIOPSY;  Surgeon: Midge Minium, MD;  Location: [facility];  Service: Endoscopy;  Laterality: N/A;   cyst removed from left foot and bone removed from left foot  April and June 2010   DILATION AND CURETTAGE OF  UTERUS     ESOPHAGOGASTRODUODENOSCOPY N/A 03/03/2016   Procedure: ESOPHAGOGASTRODUODENOSCOPY (EGD);  Surgeon: Malissa Hippo, MD;  Location: [facility];  Service: Endoscopy;  Laterality: N/A;  3:00 - moved to 2:00 - Ann to notify pt   ESOPHAGOGASTRODUODENOSCOPY (EGD) WITH PROPOFOL N/A 05/01/2021   Procedure: ESOPHAGOGASTRODUODENOSCOPY (EGD) WITH PROPOFOL;  Surgeon: Midge Minium, MD;  Location: [facility];  Service: Endoscopy;  Laterality: N/A;   KNEE ARTHROSCOPY Right 12/12/2017   Procedure: ARTHROSCOPY KNEE, MEDIAL CHONDROPLASTY, LOOSE BODY REMOVAL,PARTIAL LATERAL MENISCECTOMY;  Surgeon: Donato Heinz, MD;  Location: [facility] OR[facility];  Service: Orthopedics;  Laterality: Right;   KNEE ARTHROSCOPY Left 06/02/2018   Procedure: ARTHROSCOPY KNEE, MEDIAL CHONDROPLASTY, PARTIAL MEDIAL MENISCECTOMY;  Surgeon: Donato Heinz, MD;  Location: [facility];  Service: Orthopedics;  Laterality: Left;   laparoscopy to eval dyspepsia  2004   POLYPECTOMY N/A 05/01/2021   Procedure: POLYPECTOMY;  Surgeon: Midge Minium, MD;  Location: [facility];  Service: Endoscopy;  Laterality: N/A;   TOTAL VAGINAL HYSTERECTOMY  06/2014   with BSO   Patient Active Problem List   Diagnosis Date Noted   Insomnia 03/16/2022   Chronic pain of left ankle 11/24/2021   Baker's cyst of knee, left 11/24/2021  Overweight (BMI 25.0-29.9) 11/24/2021   Polyphagia 11/09/2021   Hair loss 11/09/2021   Constipation 08/05/2021   Polyp of transverse colon    Chronic foot pain, left 01/22/2021   Prediabetes 01/17/2020   Mass of soft tissue of chest 01/17/2020   Primary osteoarthritis of left knee 12/12/2017   GERD (gastroesophageal reflux disease) 04/06/2017   Chronic fatigue disorder 12/22/2016   Annual physical exam 12/30/2012   Vitamin D deficiency 08/27/2011   Pain in joint, multiple sites 09/02/2010   Hyperlipidemia 02/06/2010   Back pain 01/15/2008    PCP:    Kerri Perches, MD    REFERRING PROVIDER:  Gean Birchwood, MD  REFERRING DIAG: M54.50 (ICD-10-CM) - Low back pain S70.01XA (ICD-10-CM) - Contusion of right hip  Rationale for Evaluation and Treatment: Rehabilitation  THERAPY DIAG:  Pain in right hip - Plan: PT plan of care cert/re-cert  Other low back pain - Plan: PT plan of care cert/re-cert  Stiffness of right hip, not elsewhere classified - Plan: PT plan of care cert/re-cert  ONSET DATE: 01/27/2023  SUBJECTIVE:                                                                                                                                                                                           SUBJECTIVE STATEMENT: Hip bothers her when she drives, it irritates it. 4/10 R posterior hip pain currently. 2/10 low back pain currently. Does not really think the TENS helps. Next plan of care if PT does not work is to get an MRI. Everything (PT) is helping but the pain is still there.      PERTINENT HISTORY:  Low back and R hip pain secondary to a fall on 01/27/2023. Pt slipped on water, pt fell backwards. R posterior hip around the bone area is bothering her more. Pain is not as bad as it was since the fall. Pt was unable to perform R hip flexion at the time of injury.   Aggravating factors: walking up steps and incline, sitting, walking  Relieving factors: hot bath, heating pad   Pt also has chronic central low back pain which was aggravated from the fall. Denies loss of bowel or bladder function, or LE paresthesia    No blood pressure problems per pt.  No latex allergies  PAIN:  Are you having pain? R posterior hip: 6/10 currently (pt sitting on a chair), 6/10 at most for the past 3 weeks (10+/10 at time of injury)  PRECAUTIONS: None  RED FLAGS: None   WEIGHT BEARING RESTRICTIONS: No  FALLS:  Has patient fallen in last 6 months? Yes. Number of falls  1 with resulting injury needing PT eval.   LIVING ENVIRONMENT:  OCCUPATION: Pt is a housekeeper   PLOF:  Independent  PATIENT GOALS: be pain free  NEXT MD VISIT: 04/12/2023  OBJECTIVE:   DIAGNOSTIC FINDINGS:  CLINICAL DATA:  Hip trauma with fracture suspected. X-ray done. [employer]. Slip and fall injury.   EXAM: CT OF THE RIGHT HIP WITHOUT CONTRAST   TECHNIQUE: Multidetector CT imaging of the right hip was performed according to the standard protocol. Multiplanar CT image reconstructions were also generated.   RADIATION DOSE REDUCTION: This exam was performed according to the departmental dose-optimization program which includes automated exposure control, adjustment of the mA and/or kV according to patient size and/or use of iterative reconstruction technique.   COMPARISON:  Right hip radiographs 01/27/2023   FINDINGS: Bones/Joint/Cartilage   Degenerative changes in the right hip with joint space narrowing and small osteophyte formation. No evidence of acute fracture or dislocation. Visualized portion of the pelvis appears intact.   Ligaments   Suboptimally assessed by CT.   Muscles and Tendons   No intramuscular mass or collection.   Soft tissues   No abnormal soft tissue infiltration or collection. Visualized intrapelvic structures appear intact.   IMPRESSION: No acute fracture or dislocation demonstrated in the right hip. Mild degenerative changes.     CLINICAL DATA:  Hip pain   EXAM: DG HIP (WITH OR WITHOUT PELVIS) 2-3V RIGHT   COMPARISON:  None Available.   FINDINGS: Pubic symphysis appears intact. SI joints are non widened. No fracture or malalignment. Mild hip degenerative change   IMPRESSION: Mild hip degenerative change.        PATIENT SURVEYS:  Lumbar spine FOTO 66 (03/02/2023)  SCREENING FOR RED FLAGS: Bowel or bladder incontinence: No  Cauda equina syndrome: No   COGNITION: Overall cognitive status: Within functional limits for tasks assessed     SENSATION: WFL    POSTURE: forward neck, thoracic kyphosis, B protracted  shoulders, R shoulder lower, L thoracic convexity, decreased lumbar lordosis, R knee higher, R hip in ER  PALPATION: TTP sacrum (superior, and R lateral), TTP R proximal attachment of piriformis muscle  TTP R L5 > L4 > L3 TP      LUMBAR ROM:   AROM eval  Flexion WFL with R low back pain  Extension WFL  Right lateral flexion WFL with R low back pain  Left lateral flexion WFL with R low back pressure  Right rotation WFL   Left rotation WFL   (Blank rows = not tested)  LOWER EXTREMITY ROM:     PROM Right eval Left eval  Hip flexion    Hip extension    Hip abduction    Hip adduction    Hip internal rotation 12 (stiff end feel) 18 (stiff end feel and posterior hip discomfort)  Hip external rotation    Knee flexion    Knee extension    Ankle dorsiflexion    Ankle plantarflexion    Ankle inversion    Ankle eversion     (Blank rows = not tested)  LOWER EXTREMITY MMT:    MMT Right eval Left eval  Hip flexion 4-  4  Hip extension 4- 3+   Hip abduction 4 4+   Hip adduction    Hip internal rotation    Hip external rotation    Knee flexion 5 4+  Knee extension 5 5  Ankle dorsiflexion    Ankle plantarflexion    Ankle inversion    Ankle  eversion     (Blank rows = not tested)  LUMBAR SPECIAL TESTS:  (-) repeated flexion test (+) Slump test R LE with reproduction of R posterior hip symptoms.    (+) R Piriformis test with reproduction of symptoms   Long sit test suggests anterior nutation of R ASIS.   FUNCTIONAL TESTS:    GAIT: Distance walked: 60 Assistive device utilized: None Level of assistance: Complete Independence Comments: antalgic, decreased stance L LE  TODAY'S TREATMENT:                                                                                                                              DATE: 04/06/2023     Therapeutic exercise  Prone manually resisted hip extension, S/L hip abduction   Supine R hip IR stretch 1 min x 3  Supine  with R hip in 90/90 position  R hip IR AAROM with PT 10x3  Decreased R hip pain with hip IR mobility exercises  Seated hip IR   R 10x5 seconds for 2 sets  Seated hip adduction folded pillow squeeze 10x5 seconds for 3 sets  Slight decrease in R posterior hip pain but increased low back pain  Seated hip abduction isometrics with strap around knees 10x5 seconds for 3 sets  Low back feels better afterwards   Decreased R posterior hip pain afterwards.    Reviewed POC: 2x/week for another 8 weeks.      Improved exercise technique, movement at target joints, use of target muscles after mod verbal, visual, tactile cues.      PATIENT EDUCATION:  Education details: there-ex, HEP Person educated: Patient Education method: Explanation, Demonstration, Tactile cues, Verbal cues, and Handouts Education comprehension: verbalized understanding and returned demonstration  HOME EXERCISE PROGRAM: Access Code: [email or web address] URL: [email or web address] Date: 03/02/2023 Prepared by: Loralyn Freshwater  Exercises - Seated Piriformis Stretch  - 3 x daily - 7 x weekly - 1 sets - 3-5 reps - 30 seconds hold - Seated Hip Adduction Isometrics with Ball  - 1 x daily - 7 x weekly - 3 sets - 10 reps - 5 seconds hold - Supine Transversus Abdominis Bracing - Hands on Stomach  - 5 x daily - 7 x weekly - 3 sets - 10 reps - 5 seconds hold - Prone Quadriceps Set  - 1 x daily - 7 x weekly - 3 sets - 10 reps - 5 seconds hold  - Supine Bridge  - 1 x daily - 7 x weekly - 3 sets - 5 reps  - Seated Hip Abduction with Resistance  - 1 x daily - 7 x weekly - 3 sets - 10 reps - 5 seconds hold     ASSESSMENT:  CLINICAL IMPRESSION:  Pt demonstrates slight decrease in R posterior hip pain, improved hip IR ROM, R hip extension and L hip abduction strength since initial evaluation. Function however decreased by 6 points based on her FOTO score. Challenges to progress  include the chronicity of back pain that was  aggravated by the fall. Improved low back and R posterior hip pain with treatment to promote R hip IR mobility and activation of B glute med and max muscles to help decrease stress to low back.  Pt will benefit from continued skilled physical therapy services to decrease pain, improve strength and function.         OBJECTIVE IMPAIRMENTS: Abnormal gait, difficulty walking, decreased ROM, decreased strength, improper body mechanics, postural dysfunction, and pain.   ACTIVITY LIMITATIONS: sitting, stairs, and locomotion level  PARTICIPATION LIMITATIONS: occupation  PERSONAL FACTORS: Age, Fitness, Profession, and 3+ comorbidities: arthritis, chronic back pain, HTN  are also affecting patient's functional outcome.   REHAB POTENTIAL: Fair    CLINICAL DECISION MAKING: Stable/uncomplicated  EVALUATION COMPLEXITY: Low   GOALS: Goals reviewed with patient? Yes  SHORT TERM GOALS: Target date: 03/25/2023   Pt will be independent with her initial HEP to decrease pain, improve strength, function, and ability to ambulate up inclines and stairs more comfortably.  Baseline: Pt has started her initial HEP (03/02/2023); Has been able to do her HEP, no questions (04/06/2023) Goal status: Met   LONG TERM GOALS: Target date: 06/03/2023    Pt will have a decrease in R posterior hip pain to 2/10 or less at worst to promote ability to ambulate and negotiate stairs more comfortably.  Baseline: 6/10 at worst for the past 3 weeks (03/02/2023); 5/10 at worst for the past 7 days. It has improved but is not a hundred percent (04/06/2023) Goal status: ongoing  2.  Pt will improve R hip IR PROM at 90/90 position to at least 25 degrees to help decrease pain and improve ability to ambulate and negotiate stairs more comfortably.  Baseline:  Hip internal rotation Right: 12 degrees (stiff end feel) R (04/06/2023) 16 degrees, stiff end feel  (03/02/2023) Goal status: partially met  3.  Pt will improve her lumbar  spine FOTO score by at least 10 points as a demonstration of improved function.   Baseline: Lumbar spine FOTO 66 (03/02/2023); 60 (04/06/2023) Goal status: INITIAL  4.  Pt will improve her R hip extension and abduction strength by at least 1/2 MMT grade to promote ability to perform standing tasks more comfortably.  Baseline:  MMT Right eval Left eval R  (04/06/2023) L (04/06/2023)  Hip flexion 4-  4    Hip extension 4- 3+  4- 4-  Hip abduction 4 4+  4+ 4  (03/02/2023) Goal status: ongoing    PLAN:  PT FREQUENCY: 2x/week  PT DURATION: 8 weeks  PLANNED INTERVENTIONS: Therapeutic exercises, Therapeutic activity, Neuromuscular re-education, Gait training, Patient/Family education, Joint mobilization, Joint manipulation, Dry Needling, Electrical stimulation, Spinal manipulation, and Spinal mobilization.  PLAN FOR NEXT SESSION: improve R hip IR, posture, trunk and hip strength, manual techniques, modalities PRN  Loralyn Freshwater PT, DPT  04/06/2023, 1:21 PM

## 2023-04-12 ENCOUNTER — Other Ambulatory Visit: Payer: Self-pay

## 2023-04-12 MED ORDER — METHOCARBAMOL 500 MG PO TABS
500.0000 mg | ORAL_TABLET | Freq: Four times a day (QID) | ORAL | 0 refills | Status: DC | PRN
  Filled 2023-04-12: qty 60, 15d supply, fill #0

## 2023-04-12 MED ORDER — DIAZEPAM 5 MG PO TABS
5.0000 mg | ORAL_TABLET | Freq: Once | ORAL | 0 refills | Status: AC
  Filled 2023-04-12: qty 2, 1d supply, fill #0

## 2023-04-13 ENCOUNTER — Encounter: Payer: Self-pay | Admitting: Orthopedic Surgery

## 2023-04-13 ENCOUNTER — Other Ambulatory Visit: Payer: Self-pay | Admitting: Orthopedic Surgery

## 2023-04-13 DIAGNOSIS — M545 Low back pain, unspecified: Secondary | ICD-10-CM

## 2023-04-13 DIAGNOSIS — M25551 Pain in right hip: Secondary | ICD-10-CM

## 2023-04-15 ENCOUNTER — Other Ambulatory Visit: Payer: Self-pay

## 2023-04-20 ENCOUNTER — Ambulatory Visit: Payer: Worker's Compensation

## 2023-04-20 ENCOUNTER — Other Ambulatory Visit: Payer: Self-pay | Admitting: Orthopedic Surgery

## 2023-04-20 ENCOUNTER — Ambulatory Visit: Admission: RE | Admit: 2023-04-20 | Payer: Commercial Managed Care - PPO | Source: Ambulatory Visit

## 2023-04-20 ENCOUNTER — Ambulatory Visit
Admission: RE | Admit: 2023-04-20 | Discharge: 2023-04-20 | Disposition: A | Discharge status: Home / Self Care | Payer: PRIVATE HEALTH INSURANCE | Source: Ambulatory Visit | Attending: Orthopedic Surgery | Admitting: Orthopedic Surgery

## 2023-04-20 DIAGNOSIS — M545 Low back pain, unspecified: Secondary | ICD-10-CM | POA: Insufficient documentation

## 2023-04-20 DIAGNOSIS — M25551 Pain in right hip: Secondary | ICD-10-CM

## 2023-04-26 ENCOUNTER — Other Ambulatory Visit: Payer: Self-pay

## 2023-05-06 ENCOUNTER — Other Ambulatory Visit: Payer: Self-pay

## 2023-05-06 MED ORDER — TRAMADOL HCL 50 MG PO TABS
50.0000 mg | ORAL_TABLET | Freq: Four times a day (QID) | ORAL | 0 refills | Status: DC | PRN
  Filled 2023-05-06: qty 20, 5d supply, fill #0

## 2023-05-11 ENCOUNTER — Encounter: Payer: Self-pay | Admitting: Family Medicine

## 2023-05-11 ENCOUNTER — Ambulatory Visit: Payer: Commercial Managed Care - PPO | Admitting: Family Medicine

## 2023-05-11 VITALS — BP 102/68 | HR 84 | Ht 65.0 in | Wt 149.1 lb

## 2023-05-11 DIAGNOSIS — E663 Overweight: Secondary | ICD-10-CM

## 2023-05-11 DIAGNOSIS — E559 Vitamin D deficiency, unspecified: Secondary | ICD-10-CM

## 2023-05-11 DIAGNOSIS — R5383 Other fatigue: Secondary | ICD-10-CM

## 2023-05-11 DIAGNOSIS — E7849 Other hyperlipidemia: Secondary | ICD-10-CM

## 2023-05-11 DIAGNOSIS — M545 Low back pain, unspecified: Secondary | ICD-10-CM | POA: Diagnosis not present

## 2023-05-11 DIAGNOSIS — R7303 Prediabetes: Secondary | ICD-10-CM

## 2023-05-11 DIAGNOSIS — R300 Dysuria: Secondary | ICD-10-CM | POA: Diagnosis not present

## 2023-05-11 DIAGNOSIS — G8929 Other chronic pain: Secondary | ICD-10-CM | POA: Diagnosis not present

## 2023-05-11 LAB — URINALYSIS
Bilirubin, UA: NEGATIVE
Glucose, UA: NEGATIVE
Ketones, UA: NEGATIVE
Leukocytes,UA: NEGATIVE
Nitrite, UA: NEGATIVE
Protein,UA: NEGATIVE
RBC, UA: NEGATIVE
Specific Gravity, UA: 1.02 (ref 1.005–1.030)
Urobilinogen, Ur: 1 mg/dL (ref 0.2–1.0)
pH, UA: 7.5 (ref 5.0–7.5)

## 2023-05-11 NOTE — Patient Instructions (Addendum)
Annual exam in early March, call if you need me sooner  Continue to get all the medical attention you need for your back injury  Labs today, Vit D, lipid panel and chem 7  You will get my chart message regarding urine result   Thanks for choosing [facility], we consider it a privelige to serve you.

## 2023-05-12 LAB — BMP8+EGFR
BUN/Creatinine Ratio: 18 (ref 9–23)
BUN: 14 mg/dL (ref 6–24)
CO2: 25 mmol/L (ref 20–29)
Calcium: 10.2 mg/dL (ref 8.7–10.2)
Chloride: 102 mmol/L (ref 96–106)
Creatinine, Ser: 0.77 mg/dL (ref 0.57–1.00)
Glucose: 80 mg/dL (ref 70–99)
Potassium: 4.4 mmol/L (ref 3.5–5.2)
Sodium: 142 mmol/L (ref 134–144)
eGFR: 91 mL/min/{1.73_m2} (ref >59)

## 2023-05-12 LAB — LIPID PANEL
Chol/HDL Ratio: 2.6 ratio (ref 0.0–4.4)
Cholesterol, Total: 262 mg/dL — ABNORMAL HIGH (ref 100–199)
HDL: 100 mg/dL (ref >39)
LDL Chol Calc (NIH): 151 mg/dL — ABNORMAL HIGH (ref 0–99)
Triglycerides: 70 mg/dL (ref 0–149)
VLDL Cholesterol Cal: 11 mg/dL (ref 5–40)

## 2023-05-12 LAB — VITAMIN D 25 HYDROXY (VIT D DEFICIENCY, FRACTURES): Vit D, 25-Hydroxy: 42.6 ng/mL (ref 30.0–100.0)

## 2023-05-17 DIAGNOSIS — R300 Dysuria: Secondary | ICD-10-CM | POA: Insufficient documentation

## 2023-05-17 DIAGNOSIS — R5383 Other fatigue: Secondary | ICD-10-CM | POA: Insufficient documentation

## 2023-05-17 NOTE — Assessment & Plan Note (Signed)
  Patient re-educated about  the importance of commitment to a  minimum of 150 minutes of exercise per week as able.  The importance of healthy food choices with portion control discussed, as well as eating regularly and within a 12 hour window most days. The need to choose "clean , green" food 50 to 75% of the time is discussed, as well as to make water the primary drink and set a goal of 64 ounces water daily.       05/11/2023    8:50 AM 01/27/2023    7:38 PM 11/05/2022    8:58 AM  Weight /BMI  Weight 149 lb 1.9 oz 150 lb 151 lb  Height 5\' 5"  (1.651 m) 5\' 5"  (1.651 m)   BMI 24.81 kg/m2 24.96 kg/m2 25.13 kg/m2    Discontinue wqegovy

## 2023-05-17 NOTE — Assessment & Plan Note (Signed)
Hyperlipidemia:Low fat diet discussed and encouraged.   Lipid Panel  Lab Results  Component Value Date   CHOL 262 (H) 05/11/2023   HDL 100 05/11/2023   LDLCALC 151 (H) 05/11/2023   TRIG 70 05/11/2023   CHOLHDL 2.6 05/11/2023     Needs to reduce fried and fatty foods

## 2023-05-17 NOTE — Assessment & Plan Note (Signed)
CCUA in office and is NORMAL, encouraged to increase water intake

## 2023-05-17 NOTE — Assessment & Plan Note (Signed)
Updated lab needed at/ before next visit.   

## 2023-05-17 NOTE — Progress Notes (Signed)
Kristy Travis     MRN: [id number]      DOB: [birth year]  Chief Complaint  Patient presents with   Follow-up    Follow up frequent urination requested urine be checked    HPI Kristy Travis is here for follow up and re-evaluation of chronic medical conditions, medication management and review of any available recent lab and radiology data.  Preventive health is updated, specifically  Cancer screening and Immunization.  Currently out of work following fall on 07/18/2024l resulting in uncontrolled back pain she is beinmg managed through [employer] comp The PT denies any adverse reactions to current medications since the last visit.  C/o dysuria mild x 2 days, no fever or flank pain , has h/o repts UTI's , and wants urine tested ROS Denies recent fever or chills. Denies sinus pressure, nasal congestion, ear pain or sore throat. Denies chest congestion, productive cough or wheezing. Denies chest pains, palpitations and leg swelling Denies abdominal pain, nausea, vomiting,diarrhea or constipation.   Denies headaches, seizures, numbness, or tingling. Denies depression, anxiety or insomnia. Denies skin break down or rash.   PE  BP 102/68 (BP Location: Right Arm, Patient Position: Sitting, Cuff Size: Large)   Pulse 84   Ht 5\' 5"  (1.651 m)   Wt 149 lb 1.9 oz (67.6 kg)   LMP 06/24/2014   SpO2 98%   BMI 24.81 kg/m   Patient alert and oriented and in no cardiopulmonary distress.  HEENT: No facial asymmetry, EOMI,     Neck supple .  Chest: Clear to auscultation bilaterally.  CVS: S1, S2 no murmurs, no S3.Regular rate.  ABD: Soft non tender. No renal angle or suprapubic tenderness  Ext: No edema  MS: decreased  ROM lumbar spine and right , hip and normal in neck and  knees.  Skin: Intact, no ulcerations or rash noted.  Psych: Good eye contact, normal affect. Memory intact not anxious or depressed appearing.  CNS: CN 2-12 intact, power,  normal throughout.no focal  deficits noted.   Assessment & Plan  Dysuria CCUA in office and is NORMAL, encouraged to increase water intake  Hyperlipidemia Hyperlipidemia:Low fat diet discussed and encouraged.   Lipid Panel  Lab Results  Component Value Date   CHOL 262 (H) 05/11/2023   HDL 100 05/11/2023   LDLCALC 151 (H) 05/11/2023   TRIG 70 05/11/2023   CHOLHDL 2.6 05/11/2023     Needs to reduce fried and fatty foods  Vitamin D deficiency Updated lab needed at/ before next visit.   Back pain Increased and uncontrolled , fell on her job and has been out of work since currently in pT, but stil very debilitated   Overweight (BMI 25.0-29.9)  Patient re-educated about  the importance of commitment to a  minimum of 150 minutes of exercise per week as able.  The importance of healthy food choices with portion control discussed, as well as eating regularly and within a 12 hour window most days. The need to choose "clean , green" food 50 to 75% of the time is discussed, as well as to make water the primary drink and set a goal of 64 ounces water daily.       05/11/2023    8:50 AM 01/27/2023    7:38 PM 11/05/2022    8:58 AM  Weight /BMI  Weight 149 lb 1.9 oz 150 lb 151 lb  Height 5\' 5"  (1.651 m) 5\' 5"  (1.651 m)   BMI 24.81 kg/m2 24.96 kg/m2 25.13 kg/m2  Discontinue wqegovy

## 2023-05-17 NOTE — Assessment & Plan Note (Signed)
Increased and uncontrolled , fell on her job and has been out of work since currently in pT, but stil very debilitated

## 2023-09-14 ENCOUNTER — Encounter: Payer: Commercial Managed Care - PPO | Admitting: Family Medicine

## 2023-10-13 ENCOUNTER — Other Ambulatory Visit: Payer: Self-pay

## 2023-10-19 ENCOUNTER — Ambulatory Visit (INDEPENDENT_AMBULATORY_CARE_PROVIDER_SITE_OTHER): Admitting: Family Medicine

## 2023-10-19 ENCOUNTER — Other Ambulatory Visit: Payer: Self-pay

## 2023-10-19 VITALS — BP 102/70 | HR 91 | Resp 16 | Ht 65.0 in | Wt 160.1 lb

## 2023-10-19 DIAGNOSIS — Z0000 Encounter for general adult medical examination without abnormal findings: Secondary | ICD-10-CM

## 2023-10-19 DIAGNOSIS — M25511 Pain in right shoulder: Secondary | ICD-10-CM

## 2023-10-19 DIAGNOSIS — M25519 Pain in unspecified shoulder: Secondary | ICD-10-CM | POA: Insufficient documentation

## 2023-10-19 DIAGNOSIS — Z1231 Encounter for screening mammogram for malignant neoplasm of breast: Secondary | ICD-10-CM | POA: Diagnosis not present

## 2023-10-19 MED ORDER — METHOCARBAMOL 500 MG PO TABS
500.0000 mg | ORAL_TABLET | ORAL | 1 refills | Status: AC
  Filled 2023-10-19: qty 8, 28d supply, fill #0
  Filled 2023-11-15: qty 8, 28d supply, fill #1

## 2023-10-19 NOTE — Progress Notes (Unsigned)
    Kristy Travis     MRN: [id number]      DOB: [birth year]  Chief Complaint  Patient presents with   Annual Exam    Annual Exam    Shoulder Pain    Complains of right shoulder pain x2 weeks. States the pain is constant but more severe. No known injury. Is already taking pain medication from back surgery last week.     HPI: Patient is in for annual physical exam. No other health concerns are expressed or addressed at the visit. Recent labs,  are reviewed. Immunization is reviewed , and  updated if needed.   PE: Pleasant  female, alert and oriented x 3, in no cardio-pulmonary distress. Afebrile. HEENT No facial trauma or asymetry. Sinuses non tender.  Extra occullar muscles intact.. External ears normal, . Neck: supple, no adenopathy,JVD or thyromegaly.No bruits.  Chest: Clear to ascultation bilaterally.No crackles or wheezes. Non tender to palpation  Breast: No asymetry,no masses or lumps. No tenderness. No nipple discharge or inversion. No axillary or supraclavicular adenopathy  Cardiovascular system; Heart sounds normal,  S1 and  S2 ,no S3.  No murmur, or thrill. Apical beat not displaced Peripheral pulses normal.  Abdomen: Soft, non tender, no organomegaly or masses. No bruits. Bowel sounds normal. No guarding, tenderness or rebound.   GU: External genitalia normal female genitalia , normal female distribution of hair. No lesions. Urethral meatus normal in size, no  Prolapse, no lesions visibly  Present. Bladder non tender. Vagina pink and moist , with no visible lesions , discharge present . Adequate pelvic support no  cystocele or rectocele noted Cervix pink and appears healthy, no lesions or ulcerations noted, no discharge noted from os Uterus normal size, no adnexal masses, no cervical motion or adnexal tenderness.   Musculoskeletal exam: Full ROM of spine, hips , shoulders and knees. No deformity ,swelling or crepitus noted. No muscle  wasting or atrophy.   Neurologic: Cranial nerves 2 to 12 intact. Power, tone ,sensation and reflexes normal throughout. No disturbance in gait. No tremor.  Skin: Intact, no ulceration, erythema , scaling or rash noted. Pigmentation normal throughout  Psych; Normal mood and affect. Judgement and concentration normal   Assessment & Plan:  No problem-specific Assessment & Plan notes found for this encounter.

## 2023-10-19 NOTE — Assessment & Plan Note (Signed)
 Annual exam as documented. . Immunization and cancer screening needs are specifically addressed at this visit.

## 2023-10-19 NOTE — Assessment & Plan Note (Signed)
 2 week history, non provoked rated at 10 with marked limitation in movement [facility] Ortho eval at Ortho urgent care in [place] [facility])

## 2023-10-19 NOTE — Patient Instructions (Addendum)
 F/U in 6 months, call if you need me  sooner'   Please schedule mammogram  in [place] at checkout  Go to Ortho urgent care clinic [facility]) on [address] after you leave here about the shoulder please( Nurse pls provide pt with adfdress)  Labs today  All medications we have confirmed that you take will be refilled for 6 months total   All the best with health moving forward one day at a time  Thanks for choosing [facility], we consider it a privelige to serve you.

## 2023-10-20 ENCOUNTER — Encounter: Payer: Self-pay | Admitting: Family Medicine

## 2023-10-20 ENCOUNTER — Other Ambulatory Visit: Payer: Self-pay

## 2023-10-20 LAB — CMP14+EGFR
ALT: 10 IU/L (ref 0–32)
AST: 16 IU/L (ref 0–40)
Albumin: 4.4 g/dL (ref 3.8–4.9)
Alkaline Phosphatase: 76 IU/L (ref 44–121)
BUN/Creatinine Ratio: 16 (ref 9–23)
BUN: 11 mg/dL (ref 6–24)
Bilirubin Total: 0.2 mg/dL (ref 0.0–1.2)
CO2: 25 mmol/L (ref 20–29)
Calcium: 9.9 mg/dL (ref 8.7–10.2)
Chloride: 103 mmol/L (ref 96–106)
Creatinine, Ser: 0.67 mg/dL (ref 0.57–1.00)
Globulin, Total: 2.4 g/dL (ref 1.5–4.5)
Glucose: 86 mg/dL (ref 70–99)
Potassium: 4.7 mmol/L (ref 3.5–5.2)
Sodium: 142 mmol/L (ref 134–144)
Total Protein: 6.8 g/dL (ref 6.0–8.5)
eGFR: 103 mL/min/{1.73_m2} (ref >59)

## 2023-10-20 LAB — CBC WITH DIFFERENTIAL/PLATELET
Basophils Absolute: 0 10*3/uL (ref 0.0–0.2)
Basos: 0 %
EOS (ABSOLUTE): 0.1 10*3/uL (ref 0.0–0.4)
Eos: 2 %
Hematocrit: 39.8 % (ref 34.0–46.6)
Hemoglobin: 12.6 g/dL (ref 11.1–15.9)
Immature Grans (Abs): 0 10*3/uL (ref 0.0–0.1)
Immature Granulocytes: 0 %
Lymphocytes Absolute: 1.9 10*3/uL (ref 0.7–3.1)
Lymphs: 30 %
MCH: 26.3 pg — ABNORMAL LOW (ref 26.6–33.0)
MCHC: 31.7 g/dL (ref 31.5–35.7)
MCV: 83 fL (ref 79–97)
Monocytes Absolute: 0.4 10*3/uL (ref 0.1–0.9)
Monocytes: 6 %
Neutrophils Absolute: 4.1 10*3/uL (ref 1.4–7.0)
Neutrophils: 62 %
Platelets: 268 10*3/uL (ref 150–450)
RBC: 4.79 x10E6/uL (ref 3.77–5.28)
RDW: 13.2 % (ref 11.7–15.4)
WBC: 6.6 10*3/uL (ref 3.4–10.8)

## 2023-10-20 LAB — LIPID PANEL W/O CHOL/HDL RATIO
Cholesterol, Total: 253 mg/dL — ABNORMAL HIGH (ref 100–199)
HDL: 100 mg/dL (ref >39)
LDL Chol Calc (NIH): 135 mg/dL — ABNORMAL HIGH (ref 0–99)
Triglycerides: 107 mg/dL (ref 0–149)
VLDL Cholesterol Cal: 18 mg/dL (ref 5–40)

## 2023-10-20 LAB — TSH+FREE T4
Free T4: 1.47 ng/dL (ref 0.82–1.77)
TSH: 1.66 u[IU]/mL (ref 0.450–4.500)

## 2023-10-20 LAB — VITAMIN D 25 HYDROXY (VIT D DEFICIENCY, FRACTURES): Vit D, 25-Hydroxy: 26.5 ng/mL — ABNORMAL LOW (ref 30.0–100.0)

## 2023-10-21 ENCOUNTER — Encounter: Payer: Self-pay | Admitting: Family Medicine

## 2023-10-21 ENCOUNTER — Other Ambulatory Visit: Payer: Self-pay

## 2023-11-02 ENCOUNTER — Ambulatory Visit
Admission: RE | Admit: 2023-11-02 | Discharge: 2023-11-02 | Disposition: A | Discharge status: Home / Self Care | Source: Ambulatory Visit | Attending: Family Medicine | Admitting: Family Medicine

## 2023-11-02 DIAGNOSIS — Z1231 Encounter for screening mammogram for malignant neoplasm of breast: Secondary | ICD-10-CM | POA: Diagnosis not present

## 2023-11-03 ENCOUNTER — Encounter

## 2023-11-15 ENCOUNTER — Other Ambulatory Visit: Payer: Self-pay

## 2023-11-21 DIAGNOSIS — M79621 Pain in right upper arm: Secondary | ICD-10-CM | POA: Diagnosis not present

## 2023-11-22 ENCOUNTER — Telehealth: Payer: Self-pay

## 2023-11-22 ENCOUNTER — Encounter: Payer: Self-pay | Admitting: Family Medicine

## 2023-11-22 NOTE — Telephone Encounter (Signed)
 Copied from CRM [id number]. Topic: General - Other >> Nov 22, 2023 10:41 AM Hobson Luna F wrote: Reason for CRM: Patient needs a handicap placard. The doctor who sees patient for her back will only give her six months at a time, and stated that her back will never be the same, she is requesting a 3 year placard or a permanent one, and the doctor told her to ask her primay care doctor for one more long term.

## 2023-11-23 NOTE — Telephone Encounter (Signed)
 done

## 2023-11-24 DIAGNOSIS — M79621 Pain in right upper arm: Secondary | ICD-10-CM | POA: Diagnosis not present

## 2023-11-28 ENCOUNTER — Other Ambulatory Visit: Payer: Self-pay

## 2024-01-10 DIAGNOSIS — M25511 Pain in right shoulder: Secondary | ICD-10-CM | POA: Diagnosis not present

## 2024-01-10 DIAGNOSIS — M79621 Pain in right upper arm: Secondary | ICD-10-CM | POA: Diagnosis not present

## 2024-02-06 DIAGNOSIS — M79621 Pain in right upper arm: Secondary | ICD-10-CM | POA: Diagnosis not present

## 2024-02-14 DIAGNOSIS — M79621 Pain in right upper arm: Secondary | ICD-10-CM | POA: Diagnosis not present

## 2024-02-14 DIAGNOSIS — M7501 Adhesive capsulitis of right shoulder: Secondary | ICD-10-CM | POA: Diagnosis not present

## 2024-02-14 DIAGNOSIS — M25511 Pain in right shoulder: Secondary | ICD-10-CM | POA: Diagnosis not present

## 2024-02-22 DIAGNOSIS — M25511 Pain in right shoulder: Secondary | ICD-10-CM | POA: Diagnosis not present

## 2024-03-10 ENCOUNTER — Other Ambulatory Visit (HOSPITAL_COMMUNITY): Payer: Self-pay

## 2024-03-13 ENCOUNTER — Telehealth: Payer: Self-pay | Admitting: Family Medicine

## 2024-03-13 NOTE — Telephone Encounter (Signed)
 LVM for patient to call back and reschedule Oct appt- Dr Antonetta out of office.

## 2024-04-09 ENCOUNTER — Ambulatory Visit: Payer: Self-pay

## 2024-04-19 ENCOUNTER — Ambulatory Visit: Admitting: Family Medicine

## 2024-04-26 DIAGNOSIS — F4322 Adjustment disorder with anxiety: Secondary | ICD-10-CM | POA: Diagnosis not present

## 2024-04-27 NOTE — Progress Notes (Signed)
 PATIENT CLINIC NOTE - LOW BACK PAIN    SUBJECTIVE Kristy Travis is a 58 y.o. female who presents to clinic with low back pain.   History of Present Illness The patient is a female who presents for evaluation of lower back pain.  She experienced a fall on 01/27/2023, which resulted in landing on her back. This incident exacerbated her pre-existing back pain. Currently, she reports pain in the middle of her lower back, which occasionally radiates to her hip and leg but does not extend to the knee. There is no numbness or tingling in the leg. The left leg is generally unaffected, although occasional discomfort is noted. Bowel and bladder functions are reported as normal.   Prolonged standing, sitting, walking, or lying down exacerbate the symptoms, leading to frequent awakenings at night. Heat application and pain patches provide some relief. She has not undergone any surgeries or treatments other than physical therapy. Additionally, she has a tear in her arm and has been diagnosed with frozen shoulder.  PAST SURGICAL HISTORY: Gastric sleeve surgery.  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    OBJECTIVE  There were no vitals filed for this visit.   Physical Exam   Inspection: No erythema, edema, or ecchymosis noted over the lower lumbar spine Palpation: Tenderness to palpation over the right > L LS paraspinals.  Tender to palpation over right greater trochanteric bursa.  Range of Motion:  -- Flexion and extension limited by 20% with pain at end ranges Strength: -- RLE: Hip Flexion = 5/5, Knee Extension = 5/5, Dorsiflexion = 4+/5, Great Toe Extension = 5/5, Plantarflexion = 5/5 -- LLE: Hip Flexion = 5/5, Knee Extension = 5/5, Dorsiflexion = 5/5, Great Toe Extension = 5/5,  Plantarflexion = 5/5 Sensation:  -- Intact to light touch in the bilateral L2-S2 dermatomes Reflexes: -- Patellar Tendon Reflex = 2+ bilaterally -- Achilles Tendon Reflex = 2+ bilaterally -- No Ankle Clonus Reflex bilaterally Special Tests: -- Negative Straight Leg Raise Test bilaterally  Imaging: Lumbar spine MRI(04/20/23): Moderate facet arthropathy at L4-L5 with mild perifacetal soft tissue edema on the right which may reflect a source of pain. Mild bilateral subarticular zone narrowing and mild bilateral neural  foraminal stenosis at this level. Disc bulge and mild facet arthropathy at L3-L4 resulting in left worse than right subarticular zone narrowing which may affect the traversing L4 nerve root.   Results     ASSESSMENT and PLAN: Kristy Travis is a 58 y.o. female who presents to clinic with chronic low back and RLE symptoms. We had a good discussion regarding the treatment plan.  Assessment & Plan 1. Lower back pain: - The patient's lower back pain is likely due to degenerative changes exacerbated by a fall on 01/27/2023. The pain may be caused by irritation of the joints or disk, potentially affecting the nerve. - Mild changes were observed on her MRI, which could be contributing to her symptoms. Physical exam findings include a limp when getting up, weakness in the right leg, and tenderness in the lower back. -  An anti-inflammatory diet was recommended, focusing on low sugar and low carbohydrate intake. She was advised to continue physical therapy if it provides relief, but to consult with her therapist if it exacerbates her pain. - Pregabalin 25 mg causes some dizziness and will be discontinued.  Gabapentin  100 mg was sent today Right L4 selective nerve root block 03/29/24 with reduction in her overall intensity and frequency of her pain she is happy with her current results.  She is continuing with physical therapy and I will order knee PT today.  Continue current  restrictions.  Consider additional right L4 selective nerve root block if she plateaus.  2. Frozen shoulder: - The patient has a diagnosis of frozen shoulder. She was advised to maintain as much motion and strength in the shoulder as possible to aid in recovery.  3.  Right greater trochanteric bursitis Procedure: Patient with a significant flareup of right hip today.  Right GT bursa injection was completed for acute pain relief. Risk and benefits of procedure were discussed and questions were answered.  Informed consent was obtained.  Timeout was performed.  The skin overlying the injection site was cleansed with betadine and alcohol.  Injection was completed with a 25-gauge 1.5 inch needle.  After negative aspiration, injection was completed using 60 mg of triamcinolone  and 3 mL of 1% lidocaine .  Needle withdrawn intact from injection site.  Homeostasis was achieved.  No immediate complications were noted.  Patient tolerated the procedure well.   Computer technology was used to create visit note. Consent from the patient/care giver was obtained prior to its use.  Documentation for time-based billing:  Total time spent of date of service was 30 minutes.  Patient care activities included preparing to see the patient such as reviewing the patient record, obtaining and/or reviewing separately obtained history, performing a medically appropriate history and physical examination, counseling and educating the patient, family, and/or caregiver, ordering prescription medications, tests, or procedures, and documenting clinical information in the electronic or other health record.

## 2024-05-01 DIAGNOSIS — F4322 Adjustment disorder with anxiety: Secondary | ICD-10-CM | POA: Diagnosis not present

## 2024-05-04 NOTE — Progress Notes (Signed)
 05/07/2024 8:54 AM   Kristy Travis 07/25/1965 [id number]  Referring provider: Antonetta Rollene BRAVO, MD [address] [place],  [place] [address]  Urological history: 1. rUTI's - no recent cultures  2.  Nephrolithiasis - contrast CT (04/2017) 2 mm right renal stone   Chief Complaint  Patient presents with   Urinary Frequency   HPI: Kristy Travis is a 58 y.o. woman who presents today for frequency and burning.   Previous records reviewed.  She states about a week ago she started to experience an increase in her urinary frequency, dysuria and nocturia.  She also has dyspareunia which has been for the last few years.  She also had a spontaneous passage of a stone 7 years ago.  She is having 8 or more daytime voids, 3 or more episodes of nocturia with strong urge to urinate.  She does not have any urinary leakage.  She sometimes limits fluid intake.  She sometimes engages in toilet mapping.  Patient denies any modifying or aggravating factors.  Patient denies any recent UTI's, gross hematuria, dysuria or suprapubic/flank pain.  Patient denies any fevers, chills, or vomiting.    UA yellow hazy, specific remedy 1.020, pH 7.0, small bilirubin, 15 ketones, 30 protein, trace leukocyte, 6-10 WBCs, 11-20 RBCs, few bacteria and mucus present  PVR 0 mL   Serum creatinine (10/2023) 0.67, eGFR 103    PMH: Past Medical History:  Diagnosis Date   Arthritis    Back pain    BACK PAIN, LUMBAR 01/15/2008   Qualifier: Diagnosis of  By: Antonetta MD, Margaret     Carpal tunnel syndrome    Chronic fatigue    Constipation    GERD (gastroesophageal reflux disease)    GERD (gastroesophageal reflux disease) 04/06/2017   Glucose intolerance (impaired glucose tolerance) 12/12/2017   Hyperlipidemia    Hypertension    per patient this was prior to weight loss surgery/cn 12/01/17   Joint pain    Kidney stone on right side 12/21/2016   Metabolic syndrome X  09/02/2010   Qualifier: Diagnosis of  By: Charlsie LPN, Brandi     Need for shingles vaccine 12/11/2018   Obesity    Pancreatitis 07/2014   had  gall bladder removed , spent 1 week in hospital   PONV (postoperative nausea and vomiting)    Prediabetes    Primary osteoarthritis of left knee    Seasonal allergies 04/04/2012   Vitamin D  deficiency     Surgical History: Past Surgical History:  Procedure Laterality Date   ABDOMINAL HYSTERECTOMY     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   CHOLECYSTECTOMY  07/2014   COLONOSCOPY WITH PROPOFOL  N/A 05/01/2021   Procedure: COLONOSCOPY WITH BIOPSY;  Surgeon: Jinny Carmine, MD;  Location: [facility];  Service: Endoscopy;  Laterality: N/A;   cyst removed from left foot and bone removed from left foot  April and June 2010   DILATION AND CURETTAGE OF UTERUS     ESOPHAGOGASTRODUODENOSCOPY N/A 03/03/2016   Procedure: ESOPHAGOGASTRODUODENOSCOPY (EGD);  Surgeon: Claudis RAYMOND Rivet, MD;  Location: [facility];  Service: Endoscopy;  Laterality: N/A;  3:00 - moved to 2:00 - Ann to notify pt   ESOPHAGOGASTRODUODENOSCOPY (EGD) WITH PROPOFOL  N/A 05/01/2021   Procedure: ESOPHAGOGASTRODUODENOSCOPY (EGD) WITH PROPOFOL ;  Surgeon: Jinny Carmine, MD;  Location: [facility];  Service: Endoscopy;  Laterality: N/A;   KNEE ARTHROSCOPY Right 12/12/2017   Procedure: ARTHROSCOPY KNEE, MEDIAL CHONDROPLASTY, LOOSE BODY REMOVAL,PARTIAL LATERAL  MENISCECTOMY;  Surgeon: Mardee Lynwood SQUIBB, MD;  Location: [facility];  Service: Orthopedics;  Laterality: Right;   KNEE ARTHROSCOPY Left 06/02/2018   Procedure: ARTHROSCOPY KNEE, MEDIAL CHONDROPLASTY, PARTIAL MEDIAL MENISCECTOMY;  Surgeon: Mardee Lynwood SQUIBB, MD;  Location: [facility];  Service: Orthopedics;  Laterality: Left;   laparoscopy to eval dyspepsia  2004   POLYPECTOMY N/A 05/01/2021   Procedure: POLYPECTOMY;  Surgeon: Jinny Carmine, MD;  Location: [facility];  Service: Endoscopy;  Laterality: N/A;   TOTAL VAGINAL  HYSTERECTOMY  06/2014   with BSO    Home Medications:  Allergies as of 05/07/2024   No Known Allergies      Medication List        Accurate as of May 07, 2024  8:54 AM. If you have any questions, ask your nurse or doctor.          STOP taking these medications    conjugated estrogens  vaginal cream Commonly known as: PREMARIN  Stopped by: CLOTILDA CORNWALL   Estradiol  1 MG/GM Gel Commonly known as: Divigel  Stopped by: Tayelor Osborne       TAKE these medications    diclofenac  Sodium 1 % Gel Commonly known as: Voltaren  Apply 4 g topically to the affected area 3 (three) times daily.   estradiol  0.01 % Crea vaginal cream Commonly known as: ESTRACE  Apply one pea-sized amount around the opening of the urethra daily for 30 days, then 3 times weekly moving forward. Started by: CLOTILDA CORNWALL   gabapentin  100 MG capsule Commonly known as: NEURONTIN  Take 1-3 caps po 1 hr before bedtime   methocarbamol  500 MG tablet Commonly known as: ROBAXIN  Take 1 tablet (500 mg total) by mouth 2 (two) times a week as needed for back spasm and pain   nitrofurantoin  (macrocrystal-monohydrate) 100 MG capsule Commonly known as: MACROBID  Take 1 capsule (100 mg total) by mouth every 12 (twelve) hours. Started by: CLOTILDA CORNWALL   omeprazole  20 MG capsule Commonly known as: PRILOSEC Take 1 capsule (20 mg total) by mouth 2 (two) times daily before a meal.   Unifine Pentips 32G X 4 MM Misc Generic drug: Insulin  Pen Needle Use 1 needle daily to inject medication (Use 1 needle daily to inject medication)   Vitamin D  (Ergocalciferol ) 1.25 MG (50000 UNIT) Caps capsule Commonly known as: DRISDOL  Take 1 capsule (50,000 Units total) by mouth once a week.        Allergies: No Known Allergies  Family History: Family History  Problem Relation Age of Onset   Thrombosis Mother    Diabetes Mother    High blood pressure Mother    High Cholesterol Mother    Kidney disease Mother     Obesity Mother    Diabetes Father    Pulmonary Hypertension Father    Hyperlipidemia Father    Breast cancer Neg Hx    Bladder Cancer Neg Hx    Kidney cancer Neg Hx     Social History:  reports that she has never smoked. She has never been exposed to tobacco smoke. She has never used smokeless tobacco. She reports that she does not currently use alcohol. She reports that she does not use drugs.  ROS: Pertinent ROS in HPI  Physical Exam: BP 130/75 (BP Location: Left Arm, Patient Position: Sitting, Cuff Size: Large)   Pulse 79   Wt 160 lb (72.6 kg)   LMP 06/24/2014   SpO2 100%   BMI 26.63 kg/m   Constitutional:  Well nourished. Alert and oriented, No acute  distress. HEENT:  AT, moist mucus membranes.  Trachea midline Cardiovascular: No clubbing, cyanosis, or edema. Respiratory: Normal respiratory effort, no increased work of breathing. Neurologic: Grossly intact, no focal deficits, moving all 4 extremities. Psychiatric: Normal mood and affect.    Laboratory Data: See Epic and HPI   I have reviewed the labs.   Pertinent Imaging:  05/07/24 08:32  Scan Result 0mL    Assessment & Plan:    1. Urinary frequency - UA w/ with micro heme, few bacteria and pyuria - urine culture  - may be secondary to infection, urine culture is pending - if urine culture is positive, will ensure it is treated with 7 days of culture appropriate antibiotics and then reassess in two months to ensure the microscopic hematuria abates with treatment of the UTI - If urine culture is negative or the microscopic hematuria persist despite culture appropriate antibiotics or she experiences gross hematuria, we will pursue a stone protocol CT scan for further valuation  2. Genitourinary Syndrome of Menopause (GSM)  - Explained that GSM is a common condition that affects women during and after menopause. It is characterized by a cluster of symptoms related to the genital and urinary tracts, including:  vaginal dryness, pain or discomfort during intercourse (dyspareunia), burning or irritation in the vulva or vagina, thinning or loss of vaginal tissue, frequent urination, urgency (feeling the need to urinate immediately), incontinence (loss of bladder control), and pain or burning during urination - explained that GSM is primarily caused by the decline in estrogen levels during menopause. This leads to changes in the vaginal tissue, making it thinner, drier, and more susceptible to irritation. Other factors that may contribute to GSM include: Smoking, certain medications (e.g., chemotherapy drugs, and pelvic organ prolapse - advised that First-line treatment options are: Local low-dose vaginal estrogen (cream, ring, tablet), which improves dryness, irritation, dyspareunia and it is safe for most patients, including those with history of breast cancer (with multidisciplinary input). - advised they can also use vaginal moisturizers and lubricants (alone or combined) and avoid irritants and harsh cleansers.  Pelvic floor physical therapy for dysfunction is also helpful.  Referral to sex therapy or counseling if psychosocial concerns are present. - Reassured that there is no evidence linking local estrogen to breast or endometrial cancer - Explained that it may take up to 8 months of consistent application of the vaginal estrogen cream to cause unnecessary changes needed to address GSM and that this will be a lifelong medication - Will start with vaginal estrogen cream, applying a pea-sized amount with a fingertip just inside the vaginal introitus every night for 30 days and then continuing on to Monday, Wednesday and Friday nights - estradiol  (ESTRACE ) 0.01 % CREA vaginal cream, Apply one pea-sized amount around the opening of the urethra daily for 30 days, then 3 times weekly moving forward, sent to pharmacy   3.  Microscopic hematuria - see # 1   Return in about 2 months (around 07/07/2024) for Repeat  UA and symptom recheck.  These notes generated with voice recognition software. I apologize for typographical errors.  CLOTILDA HELON RIGGERS  [facility] [address]  [address] [place], [place] [address] [phone]

## 2024-05-07 ENCOUNTER — Other Ambulatory Visit: Payer: Self-pay

## 2024-05-07 ENCOUNTER — Ambulatory Visit (INDEPENDENT_AMBULATORY_CARE_PROVIDER_SITE_OTHER): Admitting: Urology

## 2024-05-07 ENCOUNTER — Encounter: Payer: Self-pay | Admitting: Urology

## 2024-05-07 ENCOUNTER — Other Ambulatory Visit
Admission: RE | Admit: 2024-05-07 | Discharge: 2024-05-07 | Disposition: A | Discharge status: Home / Self Care | Attending: Urology | Admitting: Urology

## 2024-05-07 VITALS — BP 130/75 | HR 79 | Wt 160.0 lb

## 2024-05-07 DIAGNOSIS — R350 Frequency of micturition: Secondary | ICD-10-CM | POA: Diagnosis not present

## 2024-05-07 DIAGNOSIS — N958 Other specified menopausal and perimenopausal disorders: Secondary | ICD-10-CM

## 2024-05-07 LAB — URINALYSIS, COMPLETE (UACMP) WITH MICROSCOPIC
Glucose, UA: NEGATIVE mg/dL
Hgb urine dipstick: NEGATIVE
Ketones, ur: 15 mg/dL — AB
Nitrite: NEGATIVE
Protein, ur: 30 mg/dL — AB
Specific Gravity, Urine: 1.02 (ref 1.005–1.030)
pH: 7 (ref 5.0–8.0)

## 2024-05-07 LAB — BLADDER SCAN AMB NON-IMAGING

## 2024-05-07 MED ORDER — NITROFURANTOIN MONOHYD MACRO 100 MG PO CAPS: 100.0000 mg | ORAL_CAPSULE | Freq: Two times a day (BID) | ORAL | 0 refills | Status: DC

## 2024-05-07 MED ORDER — ESTRADIOL 0.01 % VA CREA: TOPICAL_CREAM | VAGINAL | 12 refills | Status: AC

## 2024-05-08 ENCOUNTER — Ambulatory Visit: Payer: Self-pay | Admitting: Urology

## 2024-05-08 DIAGNOSIS — R3129 Other microscopic hematuria: Secondary | ICD-10-CM

## 2024-05-08 DIAGNOSIS — F4322 Adjustment disorder with anxiety: Secondary | ICD-10-CM | POA: Diagnosis not present

## 2024-05-08 LAB — URINE CULTURE: Culture: <10000 — AB

## 2024-05-14 ENCOUNTER — Ambulatory Visit
Admission: RE | Admit: 2024-05-14 | Discharge: 2024-05-14 | Disposition: A | Discharge status: Home / Self Care | Source: Ambulatory Visit | Attending: Urology | Admitting: Urology

## 2024-05-14 DIAGNOSIS — R3129 Other microscopic hematuria: Secondary | ICD-10-CM | POA: Insufficient documentation

## 2024-05-14 DIAGNOSIS — N3289 Other specified disorders of bladder: Secondary | ICD-10-CM | POA: Diagnosis not present

## 2024-05-14 DIAGNOSIS — Z9049 Acquired absence of other specified parts of digestive tract: Secondary | ICD-10-CM | POA: Diagnosis not present

## 2024-05-14 DIAGNOSIS — Z90710 Acquired absence of both cervix and uterus: Secondary | ICD-10-CM | POA: Diagnosis not present

## 2024-05-14 DIAGNOSIS — K449 Diaphragmatic hernia without obstruction or gangrene: Secondary | ICD-10-CM | POA: Diagnosis not present

## 2024-05-14 DIAGNOSIS — R10A0 Flank pain, unspecified side: Secondary | ICD-10-CM | POA: Diagnosis not present

## 2024-05-14 DIAGNOSIS — R109 Unspecified abdominal pain: Secondary | ICD-10-CM | POA: Diagnosis not present

## 2024-05-15 DIAGNOSIS — F4322 Adjustment disorder with anxiety: Secondary | ICD-10-CM | POA: Diagnosis not present

## 2024-05-21 ENCOUNTER — Encounter: Payer: Self-pay | Admitting: Urology

## 2024-05-21 ENCOUNTER — Ambulatory Visit (INDEPENDENT_AMBULATORY_CARE_PROVIDER_SITE_OTHER): Admitting: Urology

## 2024-05-21 VITALS — BP 120/75 | HR 84 | Wt 160.0 lb

## 2024-05-21 DIAGNOSIS — N958 Other specified menopausal and perimenopausal disorders: Secondary | ICD-10-CM | POA: Diagnosis not present

## 2024-05-21 DIAGNOSIS — R350 Frequency of micturition: Secondary | ICD-10-CM | POA: Diagnosis not present

## 2024-05-21 DIAGNOSIS — R3129 Other microscopic hematuria: Secondary | ICD-10-CM | POA: Diagnosis not present

## 2024-05-21 DIAGNOSIS — N200 Calculus of kidney: Secondary | ICD-10-CM

## 2024-05-21 NOTE — Progress Notes (Signed)
 05/21/2024 9:55 AM   Kristy Travis [birth year] [id number]  Referring provider: Antonetta Rollene BRAVO, MD [address], [address] [place],  [place] [address]  Urological history: 1. rUTI's - no recent cultures  2.  Nephrolithiasis - contrast CT (04/2017) 2 mm right renal stone   Chief Complaint  Patient presents with   Follow-up   Urinary Frequency   HPI: Kristy Travis is a 58 y.o. woman who presents today for her CT report.   Previous records reviewed.  At her visit on 05/07/2024, she presented with a one-week history of increased urinary frequency, dysuria, and nocturia (>=3 episodes/night), accompanied by a strong urge to void but no incontinence. She reports >=8 daytime voids and occasionally limits fluid intake and engages in toilet mapping. She has a longstanding history of dyspareunia and passed a kidney stone spontaneously 7 years ago. Denies recent UTIs, hematuria, suprapubic/flank pain, fever, chills, or vomiting. UA shows mild abnormalities including trace leukocytes, 6-10 WBCs, 11-20 RBCs, and few bacteria. PVR is 0 mL; renal function is normal (Cr 0.67, eGFR 103).  She was started on Macrobid  100 mg twice daily and estrogen cream to apply nightly for 30 days and then continue on the Monday, Wednesday and Friday evenings.  Urine culture grew out less than 10,000 colonies.    A CT renal stone study was scheduled to evaluate for possible nephrolithiasis.  She was found to have a nonobstructive 2 mm right lower pole renal stone and no hydronephrosis.  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  She continues to have urinary frequency and urgency.  She has been applying the vaginal estrogen cream nightly.  Patient denies  any modifying or aggravating factors.  Patient denies any recent UTI's, gross hematuria, dysuria or suprapubic/flank pain.  Patient denies any fevers, chills, nausea or vomiting.    PMH: Past Medical History:  Diagnosis Date   Arthritis    Back pain    BACK PAIN, LUMBAR 01/15/2008   Qualifier: Diagnosis of  By: Antonetta MD, Margaret     Carpal tunnel syndrome    Chronic fatigue    Constipation    GERD (gastroesophageal reflux disease)    GERD (gastroesophageal reflux disease) 04/06/2017   Glucose intolerance (impaired glucose tolerance) 12/12/2017   Hyperlipidemia    Hypertension    per patient this was prior to weight loss surgery/cn 12/01/17   Joint pain    Kidney stone on right side 12/21/2016   Metabolic syndrome X 09/02/2010   Qualifier: Diagnosis of  By: Charlsie LPN, Brandi     Need for shingles vaccine 12/11/2018   Obesity    Pancreatitis 07/2014   had  gall bladder removed , spent 1 week in hospital   PONV (postoperative nausea and vomiting)    Prediabetes    Primary osteoarthritis of left knee    Seasonal allergies 04/04/2012   Vitamin D  deficiency     Surgical History: Past Surgical History:  Procedure Laterality Date   ABDOMINAL HYSTERECTOMY     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   CHOLECYSTECTOMY  07/2014   COLONOSCOPY WITH PROPOFOL  N/A 05/01/2021   Procedure: COLONOSCOPY WITH BIOPSY;  Surgeon: Jinny Carmine, MD;  Location: [facility];  Service: Endoscopy;  Laterality: N/A;   cyst removed from left  foot and bone removed from left foot  April and June 2010   DILATION AND CURETTAGE OF UTERUS     ESOPHAGOGASTRODUODENOSCOPY N/A 03/03/2016   Procedure: ESOPHAGOGASTRODUODENOSCOPY (EGD);  Surgeon: Claudis RAYMOND Rivet, MD;  [facility];  Service: Endoscopy;  Laterality: N/A;  3:00 - moved to 2:00 - Ann to notify pt   ESOPHAGOGASTRODUODENOSCOPY (EGD) WITH PROPOFOL  N/A 05/01/2021   Procedure: ESOPHAGOGASTRODUODENOSCOPY (EGD) WITH PROPOFOL ;  Surgeon:  Jinny Carmine, MD;  Location: [facility];  Service: Endoscopy;  Laterality: N/A;   KNEE ARTHROSCOPY Right 12/12/2017   Procedure: ARTHROSCOPY KNEE, MEDIAL CHONDROPLASTY, LOOSE BODY REMOVAL,PARTIAL LATERAL MENISCECTOMY;  Surgeon: Mardee Lynwood SQUIBB, MD;  Location: [facility];  Service: Orthopedics;  Laterality: Right;   KNEE ARTHROSCOPY Left 06/02/2018   Procedure: ARTHROSCOPY KNEE, MEDIAL CHONDROPLASTY, PARTIAL MEDIAL MENISCECTOMY;  Surgeon: Mardee Lynwood SQUIBB, MD;  Location: [facility];  Service: Orthopedics;  Laterality: Left;   laparoscopy to eval dyspepsia  2004   POLYPECTOMY N/A 05/01/2021   Procedure: POLYPECTOMY;  Surgeon: Jinny Carmine, MD;  Location: [facility];  Service: Endoscopy;  Laterality: N/A;   TOTAL VAGINAL HYSTERECTOMY  06/2014   with BSO    Home Medications:  Allergies as of 05/21/2024       Reactions   Celecoxib  Other (See Comments)   celecoxib  Hematuria   Hydrochlorothiazide-triamterene  Nausea Only        Medication List        Accurate as of May 21, 2024  9:55 AM. If you have any questions, ask your nurse or doctor.          diclofenac  Sodium 1 % Gel Commonly known as: Voltaren  Apply 4 g topically to the affected area 3 (three) times daily.   estradiol  0.01 % Crea vaginal cream Commonly known as: ESTRACE  Apply one pea-sized amount around the opening of the urethra daily for 30 days, then 3 times weekly moving forward.   gabapentin  100 MG capsule Commonly known as: NEURONTIN  Take 1-3 caps po 1 hr before bedtime   methocarbamol  500 MG tablet Commonly known as: ROBAXIN  Take 1 tablet (500 mg total) by mouth 2 (two) times a week as needed for back spasm and pain   nitrofurantoin  (macrocrystal-monohydrate) 100 MG capsule Commonly known as: MACROBID  Take 1 capsule (100 mg total) by mouth every 12 (twelve) hours.   omeprazole  20 MG capsule Commonly known as: PRILOSEC Take 1 capsule (20 mg total) by mouth 2 (two) times daily before a  meal.   Unifine Pentips 32G X 4 MM Misc Generic drug: Insulin  Pen Needle Use 1 needle daily to inject medication (Use 1 needle daily to inject medication)   Vitamin D  (Ergocalciferol ) 1.25 MG (50000 UNIT) Caps capsule Commonly known as: DRISDOL  Take 1 capsule (50,000 Units total) by mouth once a week.        Allergies:  Allergies  Allergen Reactions   Celecoxib  Other (See Comments)    celecoxib   Hematuria   Hydrochlorothiazide-Triamterene  Nausea Only    Family History: Family History  Problem Relation Age of Onset   Thrombosis Mother    Diabetes Mother    High blood pressure Mother    High Cholesterol Mother    Kidney disease Mother    Obesity Mother    Diabetes Father    Pulmonary Hypertension Father    Hyperlipidemia Father    Breast cancer Neg Hx    Bladder Cancer Neg Hx    Kidney cancer Neg Hx     Social History:  reports that she has never smoked. She has never been exposed to tobacco smoke. She has never used smokeless tobacco. She reports that she does not currently use alcohol. She reports that she does not use drugs.  ROS: Pertinent ROS in HPI  Physical Exam: BP 120/75 (BP Location: Left Arm, Patient Position: Sitting, Cuff Size: Normal)   Pulse 84   Wt 160 lb (72.6 kg)   LMP 06/24/2014   SpO2 100%   BMI 26.63 kg/m   Constitutional:  Well nourished. Alert and oriented, No acute distress. HEENT: [place] AT, moist mucus membranes.  Trachea midline Cardiovascular: No clubbing, cyanosis, or edema. Respiratory: Normal respiratory effort, no increased work of breathing. Neurologic: Grossly intact, no focal deficits, moving all 4 extremities. Psychiatric: Normal mood and affect.    Laboratory Data: See Epic and HPI   I have reviewed the labs.   Pertinent Imaging: CLINICAL DATA:  Microscopic hematuria, abdominal pain, flank pain.   EXAM: CT ABDOMEN AND PELVIS WITHOUT CONTRAST   TECHNIQUE: Multidetector CT imaging of the abdomen and pelvis was  performed following the standard protocol without IV contrast.   RADIATION DOSE REDUCTION: This exam was performed according to the departmental dose-optimization program which includes automated exposure control, adjustment of the mA and/or kV according to patient size and/or use of iterative reconstruction technique.   COMPARISON:  CT April 12, 2017   FINDINGS: Lower chest: Small hiatal hernia with patulous distal esophagus.   Hepatobiliary: Subcentimeter hepatic hypodensity technically too small to accurately characterize but statistically likely benign. Gallbladder surgically absent. No biliary ductal dilation.   Pancreas: No pancreatic ductal dilation or evidence of acute inflammation.   Spleen: No splenomegaly.   Adrenals/Urinary Tract: No suspicious adrenal nodule/mass. Nonobstructive 2 mm right lower pole renal stone. No hydronephrosis. Urinary bladder is minimally distended limiting evaluation.   Stomach/Bowel: Prior gastric sleeve. No pathologic dilation of small or large bowel. No evidence of acute bowel inflammation.   Vascular/Lymphatic: Normal caliber abdominal aorta. Smooth IVC contours. No pathologically enlarged abdominal or pelvic lymph nodes.   Reproductive: Status post hysterectomy. No adnexal masses.   Other: No significant abdominopelvic free fluid.   Musculoskeletal: Thoracolumbar spondylosis with bridging anterior vertebral osteophytes. Degenerative change of the hips. 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   IMPRESSION: 1. Nonobstructive 2 mm right lower pole renal stone. No hydronephrosis. 2. Small hiatal hernia with patulous distal esophagus.     Electronically Signed   By: Reyes Holder M.D.   On: 05/15/2024 08:06  I have independently reviewed the films.  See HPI.     Assessment & Plan:    1. Urinary frequency - previous UA w/ with micro heme, few bacteria and pyuria - urine  culture was negative  - continue vaginal estrogen cream nightly to achieve 30 days of daily use and then continue on to Monday, Wednesday and Friday nights - I did offer a trial of Gemtesa with samples, but she deferred stating she was on enough medication - We will reassess when she returns in January with OAB questionnaire  2. Genitourinary Syndrome of Menopause (GSM)  - Continue vaginal estrogen cream nightly to achieve 30 days of daily use and then continue on a Monday, Wednesday and Friday nights - Will reassess when she returns in January with pelvic exam  3.  Microscopic hematuria - may be secondary to renal stone or GSM - Will reassess in January with repeat UA - She is  to notify us  of any gross hematuria in the interim  4. Punctate right renal stone - Will continue to monitor for any migration or growth with a KUB in 1 year  5. Abnormal findings on CT  - Explained that the muscle skeletal findings and the GI finding on her CT scan are outside of the scope of urology and encouraged her to reach out to her primary care physician to see if either of these findings need further evaluation or workup  No follow-ups on file.  These notes generated with voice recognition software. I apologize for typographical errors.  CLOTILDA HELON RIGGERS  [facility] [address]  [address] [place], [place] [address] [phone]

## 2024-05-29 ENCOUNTER — Ambulatory Visit: Admitting: Urology

## 2024-05-31 ENCOUNTER — Encounter: Payer: Self-pay | Admitting: Urology

## 2024-05-31 DIAGNOSIS — F4322 Adjustment disorder with anxiety: Secondary | ICD-10-CM | POA: Diagnosis not present

## 2024-06-13 ENCOUNTER — Telehealth: Admitting: Physician Assistant

## 2024-06-13 ENCOUNTER — Ambulatory Visit: Payer: Self-pay

## 2024-06-13 DIAGNOSIS — J208 Acute bronchitis due to other specified organisms: Secondary | ICD-10-CM | POA: Diagnosis not present

## 2024-06-13 DIAGNOSIS — B9689 Other specified bacterial agents as the cause of diseases classified elsewhere: Secondary | ICD-10-CM

## 2024-06-13 MED ORDER — PROMETHAZINE-DM 6.25-15 MG/5ML PO SYRP: 5.0000 mL | ORAL_SOLUTION | Freq: Four times a day (QID) | ORAL | 0 refills | Status: AC | PRN

## 2024-06-13 MED ORDER — AZITHROMYCIN 250 MG PO TABS: ORAL_TABLET | ORAL | 0 refills | Status: AC

## 2024-06-13 MED ORDER — BENZONATATE 100 MG PO CAPS: 100.0000 mg | ORAL_CAPSULE | Freq: Three times a day (TID) | ORAL | 0 refills | Status: AC | PRN

## 2024-06-13 NOTE — Telephone Encounter (Signed)
 FYI Only or Action Required?: FYI only for provider: appointment scheduled on 06/13/2024 at 3:45 PM with virtual urgent care.  Patient was last seen in primary care on 10/19/2023 by Antonetta Rollene BRAVO, MD.  Called Nurse Triage reporting Cough.  Symptoms began 2 weeks ago.  Interventions attempted: Rest, hydration, or home remedies.  Symptoms are: unchanged.  Triage Disposition: See Physician Within 24 Hours  Patient/caregiver understands and will follow disposition?: Yes  Copied from CRM [id number]. Topic: Clinical - Red Word Triage >> Jun 13, 2024 10:00 AM Willma R wrote: [facility] that prompted transfer to Nurse Triage: Patient states she has had a headache and productive cough with discolored mucous for the last few days. Reason for Disposition  [1] Continuous (nonstop) coughing interferes with work or school AND [2] no improvement using cough treatment per Care Advice  Answer Assessment - Initial Assessment Questions Reports two weeks of productive cough with yellow sputum. No SOB, CP or fever. Requesting a video visit. Set patient up for virtual urgent care visit for today.   1. ONSET: When did the cough begin?      On her second week with the cough 2. SEVERITY: How bad is the cough today?      Cough is severe at night. Currently the cough is moderate 3. SPUTUM: Describe the color of your sputum (e.g., none, dry cough; clear, white, yellow, green)     yellow 4. HEMOPTYSIS: Are you coughing up any blood? If Yes, ask: How much? (e.g., flecks, streaks, tablespoons, etc.)     no 5. DIFFICULTY BREATHING: Are you having difficulty breathing? If Yes, ask: How bad is it? (e.g., mild, moderate, severe)      no 6. FEVER: Do you have a fever? If Yes, ask: What is your temperature, how was it measured, and when did it start?     no 7. CARDIAC HISTORY: Do you have any history of heart disease? (e.g., heart attack, congestive heart failure)      no 8. LUNG HISTORY: Do  you have any history of lung disease?  (e.g., pulmonary embolus, asthma, emphysema)     no 9. PE RISK FACTORS: Do you have a history of blood clots? (or: recent major surgery, recent prolonged travel, bedridden)     no 10. OTHER SYMPTOMS: Do you have any other symptoms? (e.g., runny nose, wheezing, chest pain)       Headache, runny nose. 12. TRAVEL: Have you traveled out of the country in the last month? (e.g., travel history, exposures)       no  Protocols used: Cough - Acute Productive-A-AH

## 2024-06-13 NOTE — Patient Instructions (Signed)
 Kristy Travis, thank you for joining Kristy Velma Lunger, PA-C for today's virtual visit.  While this provider is not your primary care provider (PCP), if your PCP is located in our provider database this encounter information will be shared with them immediately following your visit.   A [facility] MyChart account gives you access to today's visit and all your visits, tests, and labs performed at [facility]  click here if you don't have a [facility] MyChart account or go to [email or web address]  Consent: (Patient) Kristy Travis provided verbal consent for this virtual visit at the beginning of the encounter.  Current Medications:  Current Outpatient Medications:    diclofenac  Sodium (VOLTAREN ) 1 % GEL, Apply 4 g topically to the affected area 3 (three) times daily., Disp: 200 g, Rfl: 2   ergocalciferol  (VITAMIN D2) 1.25 MG (50000 UT) capsule, Take 1 capsule (50,000 Units total) by mouth once a week., Disp: 12 capsule, Rfl: 2   estradiol  (ESTRACE ) 0.01 % CREA vaginal cream, Apply one pea-sized amount around the opening of the urethra daily for 30 days, then 3 times weekly moving forward., Disp: 42.5 g, Rfl: 12   gabapentin  (NEURONTIN ) 100 MG capsule, Take 1-3 caps po 1 hr before bedtime, Disp: , Rfl:    Insulin  Pen Needle (UNIFINE PENTIPS) 32G X 4 MM MISC, Use 1 needle daily to inject medication, Disp: 100 each, Rfl: 0   methocarbamol  (ROBAXIN ) 500 MG tablet, Take 1 tablet (500 mg total) by mouth 2 (two) times a week as needed for back spasm and pain, Disp: 24 tablet, Rfl: 1   nitrofurantoin , macrocrystal-monohydrate, (MACROBID ) 100 MG capsule, Take 1 capsule (100 mg total) by mouth every 12 (twelve) hours., Disp: 14 capsule, Rfl: 0   omeprazole  (PRILOSEC) 20 MG capsule, Take 1 capsule (20 mg total) by mouth 2 (two) times daily before a meal., Disp: 180 capsule, Rfl: 3   Medications ordered in this encounter:  No orders of the defined types were placed  in this encounter.    *If you need refills on other medications prior to your next appointment, please contact your pharmacy*  Follow-Up: Call back or seek an in-person evaluation if the symptoms worsen or if the condition fails to improve as anticipated.  [facility] [phone]  Other Instructions Take antibiotic (Azithromycin ) as directed.  Increase fluids.  Get plenty of rest. Use Mucinex  for congestion. Take prescribed medications as directed. Take a daily probiotic (I recommend Align or Culturelle, but even Activia Yogurt may be beneficial).  A humidifier placed in the bedroom may offer some relief for a dry, scratchy throat of nasal irritation.  Read information below on acute bronchitis. If you note any non-resolving, new, or worsening symptoms despite treatment, please seek an in-person evaluation ASAP.   Acute Bronchitis Bronchitis is when the airways that extend from the windpipe into the lungs get red, puffy, and painful (inflamed). Bronchitis often causes thick spit (mucus) to develop. This leads to a cough. A cough is the most common symptom of bronchitis. In acute bronchitis, the condition usually begins suddenly and goes away over time (usually in 2 weeks). Smoking, allergies, and asthma can make bronchitis worse. Repeated episodes of bronchitis may cause more lung problems.  HOME CARE Rest. Drink enough fluids to keep your pee (urine) clear or pale yellow (unless you need to limit fluids as told by your doctor). Only take over-the-counter or prescription medicines as told by your doctor. Avoid smoking and secondhand smoke.  These can make bronchitis worse. If you are a smoker, think about using nicotine gum or skin patches. Quitting smoking will help your lungs heal faster. Reduce the chance of getting bronchitis again by: Washing your hands often. Avoiding people with cold symptoms. Trying not to touch your hands to your mouth, nose, or eyes. Follow up with  your doctor as told.  GET HELP IF: Your symptoms do not improve after 1 week of treatment. Symptoms include: Cough. Fever. Coughing up thick spit. Body aches. Chest congestion. Chills. Shortness of breath. Sore throat.  GET HELP RIGHT AWAY IF:  You have an increased fever. You have chills. You have severe shortness of breath. You have bloody thick spit (sputum). You throw up (vomit) often. You lose too much body fluid (dehydration). You have a severe headache. You faint.  MAKE SURE YOU:  Understand these instructions. Will watch your condition. Will get help right away if you are not doing well or get worse. Document Released: 12/15/2007 Document Revised: 02/28/2013 Document Reviewed: 12/19/2012 [facility] Patient Information 2015 [place], [place]. This information is not intended to replace advice given to you by your health care provider. Make sure you discuss any questions you have with your health care provider.    If you have been instructed to have an in-person evaluation today at a local Urgent Care facility, please use the link below. It will take you to a list of all of our available [facility], including address, phone number and hours of operation. Please do not delay care.  [facility]  If you or a family member do not have a primary care provider, use the link below to schedule a visit and establish care. When you choose a [facility] primary care physician or advanced practice provider, you gain a long-term partner in health. Find a Primary Care Provider  Learn more about [facility]'s in-office and virtual care options: [facility] - Get Care Now

## 2024-06-13 NOTE — Progress Notes (Signed)
 Virtual Visit Consent   Kristy Travis, you are scheduled for a virtual visit with a [facility] provider today. Just as with appointments in the office, your consent must be obtained to participate. Your consent will be active for this visit and any virtual visit you may have with one of our providers in the next 365 days. If you have a MyChart account, a copy of this consent can be sent to you electronically.  As this is a virtual visit, video technology does not allow for your provider to perform a traditional examination. This may limit your provider's ability to fully assess your condition. If your provider identifies any concerns that need to be evaluated in person or the need to arrange testing (such as labs, EKG, etc.), we will make arrangements to do so. Although advances in technology are sophisticated, we cannot ensure that it will always work on either your end or our end. If the connection with a video visit is poor, the visit may have to be switched to a telephone visit. With either a video or telephone visit, we are not always able to ensure that we have a secure connection.  By engaging in this virtual visit, you consent to the provision of healthcare and authorize for your insurance to be billed (if applicable) for the services provided during this visit. Depending on your insurance coverage, you may receive a charge related to this service.  I need to obtain your verbal consent now. Are you willing to proceed with your visit today? Kristy Travis has provided verbal consent on 06/13/2024 for a virtual visit (video or telephone). Kristy Travis, [place]  Date: 06/13/2024 3:42 PM   Virtual Visit via Video Note   I, Kristy Travis, connected with  Kristy Travis  ([id number], 21-Dec-1965) on 06/13/24 at  3:45 PM EST by a video-enabled telemedicine application and verified that I am speaking with the correct person using two  identifiers.  Location: Patient: Virtual Visit Location Patient: Home Provider: Virtual Visit Location Provider: Home Office   I discussed the limitations of evaluation and management by telemedicine and the availability of in person appointments. The patient expressed understanding and agreed to proceed.    History of Present Illness: Kristy Travis is a 58 y.o. who identifies as a female who was assigned female at birth, and is being seen today for URI symptoms starting over the past 2+ weeks. Notes symptoms initially with sore throat and cough that was initially dry but now productive of thick phlegm. Denies fever, chills. Some nasal congestion and sinus pressure. Denies chest pain or SOB.   OTC -- Robitussin, Mucinex   HPI: HPI  Problems:  Patient Active Problem List   Diagnosis Date Noted   Shoulder pain 10/19/2023   Dysuria 05/17/2023   Other fatigue 05/17/2023   Insomnia 03/16/2022   Chronic pain of left ankle 11/24/2021   Baker's cyst of knee, left 11/24/2021   Overweight (BMI 25.0-29.9) 11/24/2021   Polyphagia 11/09/2021   Hair loss 11/09/2021   Constipation 08/05/2021   Polyp of transverse colon    Chronic foot pain, left 01/22/2021   Prediabetes 01/17/2020   Mass of soft tissue of chest 01/17/2020   Primary osteoarthritis of left knee 12/12/2017   GERD (gastroesophageal reflux disease) 04/06/2017   Chronic fatigue disorder 12/22/2016   Annual physical exam 12/30/2012   Vitamin D  deficiency 08/27/2011   Pain in joint, multiple sites 09/02/2010   Hyperlipidemia 02/06/2010   Back pain 01/15/2008  Allergies:  Allergies  Allergen Reactions   Celecoxib  Other (See Comments)    celecoxib   Hematuria   Hydrochlorothiazide-Triamterene  Nausea Only   Medications:  Current Outpatient Medications:    azithromycin  (ZITHROMAX ) 250 MG tablet, Take 2 tablets on day 1, then 1 tablet daily on days 2 through 5, Disp: 6 tablet, Rfl: 0   benzonatate  (TESSALON ) 100 MG  capsule, Take 1 capsule (100 mg total) by mouth 3 (three) times daily as needed for cough., Disp: 30 capsule, Rfl: 0   promethazine -dextromethorphan (PROMETHAZINE -DM) 6.25-15 MG/5ML syrup, Take 5 mLs by mouth 4 (four) times daily as needed for cough., Disp: 118 mL, Rfl: 0   diclofenac  Sodium (VOLTAREN ) 1 % GEL, Apply 4 g topically to the affected area 3 (three) times daily., Disp: 200 g, Rfl: 2   ergocalciferol  (VITAMIN D2) 1.25 MG (50000 UT) capsule, Take 1 capsule (50,000 Units total) by mouth once a week., Disp: 12 capsule, Rfl: 2   estradiol  (ESTRACE ) 0.01 % CREA vaginal cream, Apply one pea-sized amount around the opening of the urethra daily for 30 days, then 3 times weekly moving forward., Disp: 42.5 g, Rfl: 12   gabapentin  (NEURONTIN ) 100 MG capsule, Take 1-3 caps po 1 hr before bedtime, Disp: , Rfl:    Insulin  Pen Needle (UNIFINE PENTIPS) 32G X 4 MM MISC, Use 1 needle daily to inject medication, Disp: 100 each, Rfl: 0   methocarbamol  (ROBAXIN ) 500 MG tablet, Take 1 tablet (500 mg total) by mouth 2 (two) times a week as needed for back spasm and pain, Disp: 24 tablet, Rfl: 1   omeprazole  (PRILOSEC) 20 MG capsule, Take 1 capsule (20 mg total) by mouth 2 (two) times daily before a meal., Disp: 180 capsule, Rfl: 3  Observations/Objective: Patient is well-developed, well-nourished in no acute distress.  Resting comfortably  at home.  Head is normocephalic, atraumatic.  No labored breathing.  Speech is clear and coherent with logical content.  Patient is alert and oriented at baseline.    Assessment and Plan: 1. Acute bacterial bronchitis (Primary) - promethazine -dextromethorphan (PROMETHAZINE -DM) 6.25-15 MG/5ML syrup; Take 5 mLs by mouth 4 (four) times daily as needed for cough.  Dispense: 118 mL; Refill: 0 - benzonatate  (TESSALON ) 100 MG capsule; Take 1 capsule (100 mg total) by mouth 3 (three) times daily as needed for cough.  Dispense: 30 capsule; Refill: 0 - azithromycin  (ZITHROMAX )  250 MG tablet; Take 2 tablets on day 1, then 1 tablet daily on days 2 through 5  Dispense: 6 tablet; Refill: 0  Rx Azithromycin .  Increase fluids.  Rest.  Saline nasal spray.  Probiotic.  Mucinex  as directed.  Humidifier in bedroom. Cough medications as directed.  Call or return to clinic if symptoms are not improving.   Follow Up Instructions: I discussed the assessment and treatment plan with the patient. The patient was provided an opportunity to ask questions and all were answered. The patient agreed with the plan and demonstrated an understanding of the instructions.  A copy of instructions were sent to the patient via MyChart unless otherwise noted below.   The patient was advised to call back or seek an in-person evaluation if the symptoms worsen or if the condition fails to improve as anticipated.    Kristy Velma Lunger, PA-C

## 2024-06-13 NOTE — Telephone Encounter (Signed)
 Noted patient doing virtual

## 2024-07-05 NOTE — Progress Notes (Signed)
 "    07/16/2024 9:08 AM   Kristy Travis 02-Jan-1966 [id number]  Referring provider: Antonetta Rollene BRAVO, MD [address] [place],  [place] [address]  Urological history: 1. rUTI's - no recent cultures  2.  Nephrolithiasis - contrast CT (04/2017) 2 mm right renal stone   Chief Complaint  Patient presents with   Urinary Frequency   HPI: Kristy Travis is a 58 y.o. woman who presents today for two months follow up.    Previous records reviewed.  She has been experiencing a recent onset of irritative  voiding symptoms.  She has had micro heme with a negative culture.  She underwent a CT renal stone study to evaluate for a possible ureteral stones, but she was found only to have non obstructing 2 mm right renal stones.   She is having 8 or more daytime voids, 1-2 episodes of nocturia with a mild urge to urinate.  She does not have urinary leakage.  She sometimes limits fluid intake.  She does not engage in toilet mapping.  Patient denies any modifying or aggravating factors.  Patient denies any recent UTI's, gross hematuria, dysuria or suprapubic/flank pain.  Patient denies any fevers, chills, nausea or vomiting.    She has found no improvement in her symptoms since starting the vaginal estrogen cream.    She has issues with constipation and is having lower abdominal pain and some nausea.  UA yellow clear, specific gravity 1.020, pH 6.5, trace leuks, 6-10 WBCs, 0-2 RBCs, greater than 10 epithelial cells, granular casts present, mucus threads present and moderate bacteria.  PVR (05/2024) 0 mL   Serum creatinine (10/2023) 0.67  PMH: Past Medical History:  Diagnosis Date   Arthritis    Back pain    BACK PAIN, LUMBAR 01/15/2008   Qualifier: Diagnosis of  By: Antonetta MD, Margaret     Carpal tunnel syndrome    Chronic fatigue    Constipation    GERD (gastroesophageal reflux disease)    GERD (gastroesophageal reflux disease) 04/06/2017   Glucose  intolerance (impaired glucose tolerance) 12/12/2017   Hyperlipidemia    Hypertension    per patient this was prior to weight loss surgery/cn 12/01/17   Joint pain    Kidney stone on right side 12/21/2016   Metabolic syndrome X 09/02/2010   Qualifier: Diagnosis of  By: Charlsie LPN, Brandi     Need for shingles vaccine 12/11/2018   Obesity    Pancreatitis 07/2014   had  gall bladder removed , spent 1 week in hospital   PONV (postoperative nausea and vomiting)    Prediabetes    Primary osteoarthritis of left knee    Seasonal allergies 04/04/2012   Vitamin D  deficiency     Surgical History: Past Surgical History:  Procedure Laterality Date   ABDOMINAL HYSTERECTOMY     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   CHOLECYSTECTOMY  07/2014   COLONOSCOPY WITH PROPOFOL  N/A 05/01/2021   Procedure: COLONOSCOPY WITH BIOPSY;  Surgeon: Jinny Carmine, MD;  Location: [facility];  Service: Endoscopy;  Laterality: N/A;   cyst removed from left foot and bone removed from left foot  April and June 2010   DILATION AND CURETTAGE OF UTERUS     ESOPHAGOGASTRODUODENOSCOPY N/A 03/03/2016   Procedure: ESOPHAGOGASTRODUODENOSCOPY (EGD);  Surgeon: Claudis RAYMOND Rivet, MD;  Location: [facility];  Service: Endoscopy;  Laterality: N/A;  3:00 - moved to 2:00 - Ann to notify pt   ESOPHAGOGASTRODUODENOSCOPY (EGD) WITH  PROPOFOL  N/A 05/01/2021   Procedure: ESOPHAGOGASTRODUODENOSCOPY (EGD) WITH PROPOFOL ;  Surgeon: Jinny Carmine, MD;  Location: [facility];  Service: Endoscopy;  Laterality: N/A;   KNEE ARTHROSCOPY Right 12/12/2017   Procedure: ARTHROSCOPY KNEE, MEDIAL CHONDROPLASTY, LOOSE BODY REMOVAL,PARTIAL LATERAL MENISCECTOMY;  Surgeon: Mardee Lynwood SQUIBB, MD;  Location: [facility];  Service: Orthopedics;  Laterality: Right;   KNEE ARTHROSCOPY Left 06/02/2018   Procedure: ARTHROSCOPY KNEE, MEDIAL CHONDROPLASTY, PARTIAL MEDIAL MENISCECTOMY;  Surgeon: Mardee Lynwood SQUIBB, MD;  Location: [facility];  Service: Orthopedics;   Laterality: Left;   laparoscopy to eval dyspepsia  2004   POLYPECTOMY N/A 05/01/2021   Procedure: POLYPECTOMY;  Surgeon: Jinny Carmine, MD;  Location: [facility];  Service: Endoscopy;  Laterality: N/A;   TOTAL VAGINAL HYSTERECTOMY  06/2014   with BSO    Home Medications:  Allergies as of 07/16/2024       Reactions   Celecoxib  Other (See Comments)   celecoxib  Hematuria   Hydrochlorothiazide-triamterene  Nausea Only        Medication List        Accurate as of July 16, 2024  9:08 AM. If you have any questions, ask your nurse or doctor.          benzonatate  100 MG capsule Commonly known as: TESSALON  Take 1 capsule (100 mg total) by mouth 3 (three) times daily as needed for cough.   diclofenac  Sodium 1 % Gel Commonly known as: Voltaren  Apply 4 g topically to the affected area 3 (three) times daily.   estradiol  0.01 % Crea vaginal cream Commonly known as: ESTRACE  Apply one pea-sized amount around the opening of the urethra daily for 30 days, then 3 times weekly moving forward.   gabapentin  100 MG capsule Commonly known as: NEURONTIN  Take 1-3 caps po 1 hr before bedtime   methocarbamol  500 MG tablet Commonly known as: ROBAXIN  Take 1 tablet (500 mg total) by mouth 2 (two) times a week as needed for back spasm and pain   omeprazole  20 MG capsule Commonly known as: PRILOSEC Take 1 capsule (20 mg total) by mouth 2 (two) times daily before a meal.   promethazine -dextromethorphan 6.25-15 MG/5ML syrup Commonly known as: PROMETHAZINE -DM Take 5 mLs by mouth 4 (four) times daily as needed for cough.   Unifine Pentips 32G X 4 MM Misc Generic drug: Insulin  Pen Needle Use 1 needle daily to inject medication (Use 1 needle daily to inject medication)   Vitamin D  (Ergocalciferol ) 1.25 MG (50000 UNIT) Caps capsule Commonly known as: DRISDOL  Take 1 capsule (50,000 Units total) by mouth once a week.        Allergies:  Allergies  Allergen Reactions   Celecoxib   Other (See Comments)    celecoxib   Hematuria   Hydrochlorothiazide-Triamterene  Nausea Only    Family History: Family History  Problem Relation Age of Onset   Thrombosis Mother    Diabetes Mother    High blood pressure Mother    High Cholesterol Mother    Kidney disease Mother    Obesity Mother    Diabetes Father    Pulmonary Hypertension Father    Hyperlipidemia Father    Breast cancer Neg Hx    Bladder Cancer Neg Hx    Kidney cancer Neg Hx     Social History:  reports that she has never smoked. She has never been exposed to tobacco smoke. She has never used smokeless tobacco. She reports that she does not currently use alcohol. She reports that she does not use drugs.  ROS: Pertinent ROS in HPI  Physical Exam: BP 111/72   Pulse 98   Wt 160 lb (72.6 kg)   LMP 06/24/2014   SpO2 100%   BMI 26.63 kg/m   Constitutional:  Well nourished. Alert and oriented, No acute distress. HEENT: [place] AT, moist mucus membranes.  Trachea midline Cardiovascular: No clubbing, cyanosis, or edema. Respiratory: Normal respiratory effort, no increased work of breathing. GU: No CVA tenderness.  No bladder fullness or masses.  Recession of labia minora, dry, pale vulvar vaginal mucosa and loss of mucosal ridges and folds.  Normal urethral meatus, no lesions, no prolapse, no discharge.   Urethra exquisitely tender on palpation.  No discharge elicited.  Bladder was exquisitely tender on palpation.  Pale vagina mucosa, poor estrogen effect, no discharge, no lesions, fair pelvic support, no cystocele and no rectocele noted.  No cervical motion tenderness.  Anus and perineum are without rashes or lesions.     Neurologic: Grossly intact, no focal deficits, moving all 4 extremities. Psychiatric: Normal mood and affect.    Laboratory Data: See Epic and HPI   I have reviewed the labs.   Pertinent Imaging: N/A   Assessment & Plan:    1. Urinary frequency - continue the vaginal estrogen cream three  nights weekly - Urine sent for culture to rule out UTI  - cystoscopy pending  2. Genitourinary Syndrome of Menopause (GSM)  - she will continue the vaginal estrogen cream three nights weekly   3.  Microscopic hematuria - UA w/ no micro heme, but given the 11-20 RBCs on her UA in October with negative culture and her exquisite tenderness on today's exam, we will go forward and schedule cystoscopy - urine sent for culture - Explained cystoscopy as a quick, safe, in-office diagnostic procedure performed by our physicians using a thin, lighted scope to evaluate the urethra and bladder. Procedure duration approx. 5 minutes. Reviewed indications: assess bladder health and rule out other conditions (stricture disease, stones, malignancy). Pre-procedure instructions: no restrictions on food or fluids; patient may continue all medications; patient may drive to and from the appointment. Procedure overview: urethral area will be cleansed; topical anesthetic gel applied; scope gently inserted into urethra and bladder with fluid instilled for visualization. Expected sensations may include mild discomfort (pinching), bladder fullness, coolness, or urge to void. Post-procedure expectations: temporary urinary frequency, dysuria, and mild hematuria for 24-48 hours. Reviewed red-flag symptoms: fever, bright red blood or blood clots, abdominal pain, or difficulty urinating. Patient instructed to contact the office or seek ED care if these occur. Patient informed that the physician will discuss findings/results at the time of the procedure.  4. Punctate right renal stone - Will continue to monitor for any migration or growth with a KUB in 1 year  Return for cysto for micro heme and bladder pain .  These notes generated with voice recognition software. I apologize for typographical errors.  Kristy Travis  [facility] [address]  [address] [place], [place] [address] [phone]  "

## 2024-07-16 ENCOUNTER — Ambulatory Visit (INDEPENDENT_AMBULATORY_CARE_PROVIDER_SITE_OTHER): Admitting: Urology

## 2024-07-16 ENCOUNTER — Encounter: Payer: Self-pay | Admitting: Urology

## 2024-07-16 VITALS — BP 111/72 | HR 98 | Wt 160.0 lb

## 2024-07-16 DIAGNOSIS — R3129 Other microscopic hematuria: Secondary | ICD-10-CM

## 2024-07-16 DIAGNOSIS — N958 Other specified menopausal and perimenopausal disorders: Secondary | ICD-10-CM | POA: Diagnosis not present

## 2024-07-16 DIAGNOSIS — R350 Frequency of micturition: Secondary | ICD-10-CM

## 2024-07-16 DIAGNOSIS — N200 Calculus of kidney: Secondary | ICD-10-CM | POA: Diagnosis not present

## 2024-07-16 DIAGNOSIS — R102 Pelvic and perineal pain unspecified side: Secondary | ICD-10-CM | POA: Diagnosis not present

## 2024-07-16 NOTE — Patient Instructions (Signed)

## 2024-07-17 LAB — MICROSCOPIC EXAMINATION: Epithelial Cells (non renal): >10 /HPF — AB (ref 0–10)

## 2024-07-17 LAB — URINALYSIS, COMPLETE
Bilirubin, UA: NEGATIVE
Glucose, UA: NEGATIVE
Ketones, UA: NEGATIVE
Nitrite, UA: NEGATIVE
Protein,UA: NEGATIVE
RBC, UA: NEGATIVE
Specific Gravity, UA: 1.02 (ref 1.005–1.030)
Urobilinogen, Ur: 0.2 mg/dL (ref 0.2–1.0)
pH, UA: 6.5 (ref 5.0–7.5)

## 2024-07-18 LAB — CULTURE, URINE COMPREHENSIVE

## 2024-07-19 ENCOUNTER — Ambulatory Visit: Payer: Self-pay | Admitting: Urology

## 2024-07-19 MED ORDER — AMOXICILLIN-POT CLAVULANATE 875-125 MG PO TABS: 1.0000 | ORAL_TABLET | Freq: Two times a day (BID) | ORAL | 0 refills | Status: AC

## 2024-08-14 ENCOUNTER — Ambulatory Visit: Payer: Self-pay | Admitting: Family Medicine

## 2024-09-11 ENCOUNTER — Other Ambulatory Visit: Admitting: Urology
